# Patient Record
Sex: Female | Born: 1990
Health system: Southern US, Community
[De-identification: ages and names within clinical notes are randomized; demographics above are authoritative.]

## PROBLEM LIST (undated history)

## (undated) DIAGNOSIS — R0981 Nasal congestion: Secondary | ICD-10-CM

## (undated) DIAGNOSIS — R059 Cough, unspecified: Secondary | ICD-10-CM

## (undated) DIAGNOSIS — R109 Unspecified abdominal pain: Secondary | ICD-10-CM

## (undated) DIAGNOSIS — R519 Headache, unspecified: Secondary | ICD-10-CM

## (undated) DIAGNOSIS — E282 Polycystic ovarian syndrome: Secondary | ICD-10-CM

## (undated) DIAGNOSIS — D219 Benign neoplasm of connective and other soft tissue, unspecified: Secondary | ICD-10-CM

## (undated) DIAGNOSIS — R51 Headache: Secondary | ICD-10-CM

## (undated) DIAGNOSIS — R110 Nausea: Secondary | ICD-10-CM

## (undated) DIAGNOSIS — J029 Acute pharyngitis, unspecified: Secondary | ICD-10-CM

## (undated) DIAGNOSIS — R05 Cough: Secondary | ICD-10-CM

## (undated) HISTORY — DX: Headache: R51

## (undated) HISTORY — DX: Unspecified abdominal pain: R10.9

## (undated) HISTORY — DX: Cough, unspecified: R05.9

## (undated) HISTORY — DX: Cough: R05

## (undated) HISTORY — DX: Headache, unspecified: R51.9

## (undated) HISTORY — DX: Acute pharyngitis, unspecified: J02.9

## (undated) HISTORY — DX: Nasal congestion: R09.81

## (undated) HISTORY — DX: Nausea: R11.0

## (undated) HISTORY — PX: WISDOM TOOTH EXTRACTION: SHX21

---
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
## 2011-12-02 ENCOUNTER — Encounter (INDEPENDENT_AMBULATORY_CARE_PROVIDER_SITE_OTHER): Payer: Self-pay | Admitting: General Surgery

## 2011-12-02 ENCOUNTER — Other Ambulatory Visit (INDEPENDENT_AMBULATORY_CARE_PROVIDER_SITE_OTHER): Payer: Self-pay | Admitting: General Surgery

## 2011-12-02 ENCOUNTER — Ambulatory Visit (INDEPENDENT_AMBULATORY_CARE_PROVIDER_SITE_OTHER): Payer: BC Managed Care – PPO | Admitting: General Surgery

## 2011-12-02 VITALS — BP 122/76 | HR 72 | Temp 97.3°F | Resp 14 | Ht 67.0 in | Wt 242.1 lb

## 2011-12-02 DIAGNOSIS — R1013 Epigastric pain: Secondary | ICD-10-CM

## 2011-12-02 NOTE — Progress Notes (Signed)
Patient ID: Caitlyn Ramos, female   DOB: 1990/11/15, 21 y.o.   MRN: [id number]  Chief Complaint  Patient presents with  . Abdominal Pain    HPI Caitlyn Ramos is a 21 y.o. female.  She is referred by Dr. Aram Beecham, [facility] emergency department, for evaluation of "gallstones".  Over the past 2 weeks she has had 3 episodes of nocturnal epigastric pain radiating to the back. She says this is non-lateralizing but associated with nausea, no vomiting. This has never happened in the past otherwise. No diarrhea.  She went to the emergency room  yesterday. Lab work shows hemoglobin 12.9, WBC 8700, AST 50, ALT 62, but slightly elevated, lipase 28.  CT scan showed a 4.7 cm cyst in the right adnexal area. The gallbladder was within normal limits. An ultrasound was not done.  She was discharged with a diagnosis of gallstones and told to see a surgeon and made an appointment to see me. She is asymptomatic today.  Family history is notable for cholecystectomy in a grandmother and a great aunt.  Her medical history is significant for obesity but otherwise she is healthy and she is an Building control surveyor at [employer]. She is here with her mother today. She denies alcohol use, specifically denies it within 24 hours of the emergency department visit.Marland Kitchen HPI  Past Medical History  Diagnosis Date  . Nasal congestion   . Sore throat   . Cough   . Abdominal pain   . Nausea   . Generalized headaches     Past Surgical History  Procedure Date  . Wisdom tooth extraction 2009 approximate    Family History  Problem Relation Age of Onset  . Cancer Maternal Grandfather     lung    Social History History  Substance Use Topics  . Smoking status: Never Smoker   . Smokeless tobacco: Never Used  . Alcohol Use: No    No Known Allergies  Current Outpatient Prescriptions  Medication Sig Dispense Refill  . acetaminophen (TYLENOL) 500 MG tablet Take 500 mg by mouth as needed.      . Naproxen  Sodium (ALEVE PO) Take by mouth as needed.      . phentermine (ADIPEX-P) 37.5 MG tablet Daily.        Review of Systems Review of Systems  Constitutional: Negative for fever, chills and unexpected weight change.  HENT: Negative for hearing loss, congestion, sore throat, trouble swallowing and voice change.   Eyes: Negative for visual disturbance.  Respiratory: Negative for cough and wheezing.   Cardiovascular: Negative for chest pain, palpitations and leg swelling.  Gastrointestinal: Positive for nausea and abdominal pain. Negative for vomiting, diarrhea, constipation, blood in stool, abdominal distention and anal bleeding.  Genitourinary: Negative for hematuria, vaginal bleeding and difficulty urinating.  Musculoskeletal: Negative for arthralgias.  Skin: Negative for rash and wound.  Neurological: Negative for seizures, syncope and headaches.  Hematological: Negative for adenopathy. Does not bruise/bleed easily.  Psychiatric/Behavioral: Negative for confusion.    Blood pressure 122/76, pulse 72, temperature 97.3 F (36.3 C), temperature source Temporal, resp. rate 14, height 5\' 7"  (1.702 m), weight 242 lb 2 oz (109.827 kg).  Physical Exam Physical Exam  Constitutional: She is oriented to person, place, and time. She appears well-developed and well-nourished. No distress.       BMI 37.92  HENT:  Head: Normocephalic and atraumatic.  Nose: Nose normal.  Mouth/Throat: No oropharyngeal exudate.  Eyes: Conjunctivae normal and EOM are normal. Pupils are equal, round,  and reactive to light. Left eye exhibits no discharge. No scleral icterus.  Neck: Neck supple. No JVD present. No tracheal deviation present. No thyromegaly present.  Cardiovascular: Normal rate, regular rhythm, normal heart sounds and intact distal pulses.   No murmur heard. Pulmonary/Chest: Effort normal and breath sounds normal. No respiratory distress. She has no wheezes. She has no rales. She exhibits no tenderness.    Abdominal: Soft. Bowel sounds are normal. She exhibits no distension and no mass. There is no tenderness. There is no rebound and no guarding.       Subjectively tender in the epigastrium, exam otherwise unremarkable.  Musculoskeletal: She exhibits no edema and no tenderness.  Lymphadenopathy:    She has no cervical adenopathy.  Neurological: She is alert and oriented to person, place, and time. She exhibits normal muscle tone. Coordination normal.  Skin: Skin is warm. No rash noted. She is not diaphoretic. No erythema. No pallor.  Psychiatric: She has a normal mood and affect. Her behavior is normal. Judgment and thought content normal.    Data Reviewed Lab work, CT scan, and physician records from [facility] emergency department dated 12/01/2011.  Assessment    Recent onset intermittent attacks of epigastric pain. The history of the pain and the slight elevation of liver function tests suggest biliary tract disease. Alternatively as this could be due to the fatty infiltration of the liver.  Obesity    Plan    I discussed the differential diagnosis with the patient and her mother.  Low-fat diet  Gallbladder ultrasound  She was advised to see her gynecologist for further evaluation of the right adnexal cyst, although I told her I did not think this was the source of her pain.  Return to see me in 2 weeks.       Angelia Mould. Derrell Lolling, M.D., [facility] General and Minimally invasive Surgery Breast and Colorectal Surgery Office:   [phone] Pager:   [phone]  12/02/2011, 4:10 PM

## 2011-12-02 NOTE — Patient Instructions (Signed)
Your episodes of upper abdominal pain sound like gallbladder attacks. The CT scan showed a normal gallbladder, however.  You are  advised to follow a very low-fat diet for now.  You are advised to see your gynecologist to evaluate the right ovarian cyst.  You will be scheduled for a gallbladder ultrasound.  Return to see Dr. Derrell Lolling in approximately 2 weeks.

## 2011-12-04 ENCOUNTER — Ambulatory Visit (HOSPITAL_COMMUNITY)
Admission: RE | Admit: 2011-12-04 | Discharge: 2011-12-04 | Disposition: A | Discharge status: Home / Self Care | Payer: BC Managed Care – PPO | Source: Ambulatory Visit | Attending: General Surgery | Admitting: General Surgery

## 2011-12-04 ENCOUNTER — Other Ambulatory Visit (INDEPENDENT_AMBULATORY_CARE_PROVIDER_SITE_OTHER): Payer: Self-pay | Admitting: General Surgery

## 2011-12-04 DIAGNOSIS — R1013 Epigastric pain: Secondary | ICD-10-CM

## 2011-12-04 DIAGNOSIS — R109 Unspecified abdominal pain: Secondary | ICD-10-CM | POA: Insufficient documentation

## 2011-12-04 DIAGNOSIS — R945 Abnormal results of liver function studies: Secondary | ICD-10-CM | POA: Insufficient documentation

## 2011-12-07 ENCOUNTER — Telehealth (INDEPENDENT_AMBULATORY_CARE_PROVIDER_SITE_OTHER): Payer: Self-pay | Admitting: General Surgery

## 2011-12-07 NOTE — Telephone Encounter (Signed)
Called patient to advise of test results at the request of Dr. Derrell Lolling. Patient advised that if the problems persist she may need to be seen by gastroenterology, as there was nothing based on the upper gi and ultrasound that showed a problem with her gallbladder.  Advised patient to still keep an eye on what she eats and stay away from fatty, fried, oily foods and if the problems continue to see GI doctor.  Patient requested for appointment on 12/17/11 to be cancelled based on results and advised she will call back if the problems continue in order to be referred to GI if needed.

## 2011-12-17 ENCOUNTER — Encounter (INDEPENDENT_AMBULATORY_CARE_PROVIDER_SITE_OTHER): Payer: BC Managed Care – PPO | Admitting: General Surgery

## 2012-01-18 ENCOUNTER — Encounter (INDEPENDENT_AMBULATORY_CARE_PROVIDER_SITE_OTHER): Payer: Self-pay

## 2016-07-14 ENCOUNTER — Encounter: Payer: Self-pay | Admitting: Adult Health

## 2016-07-14 ENCOUNTER — Ambulatory Visit (INDEPENDENT_AMBULATORY_CARE_PROVIDER_SITE_OTHER): Payer: BC Managed Care – PPO | Admitting: Adult Health

## 2016-07-14 DIAGNOSIS — E669 Obesity, unspecified: Secondary | ICD-10-CM | POA: Insufficient documentation

## 2016-07-14 DIAGNOSIS — Z0000 Encounter for general adult medical examination without abnormal findings: Secondary | ICD-10-CM | POA: Diagnosis not present

## 2016-07-14 DIAGNOSIS — B3731 Acute candidiasis of vulva and vagina: Secondary | ICD-10-CM | POA: Insufficient documentation

## 2016-07-14 DIAGNOSIS — B373 Candidiasis of vulva and vagina: Secondary | ICD-10-CM | POA: Diagnosis not present

## 2016-07-14 MED ORDER — FLUCONAZOLE 150 MG PO TABS: 150.0000 mg | ORAL_TABLET | Freq: Once | ORAL | 2 refills | Status: AC

## 2016-07-14 NOTE — Progress Notes (Signed)
Subjective:    Patient ID: Caitlyn Ramos, female    DOB: July 10, 1990, 26 y.o.   MRN: [id number]  HPI :  Ms. Kuwahara presents to establish as a new pt.  She is a very pleasant 26 year old female.  PMH:  Obesity and chronic UTIs and vaginal yeast infections. Has lost 100 lbs in last 12 months via TLC: IMPRESSIVE! 1500 cal daily diet, eats meals Q3H. Trains with coach 4days/week-cardio and weight training. Current wt 242 Goal wt 170 She currently has sx's of vaginal yeast infection that began yesterday (thick/white/clumpy vaginal d/c).  She denies abdominal pain, N/V/D, blood in urine/stool. She is followed by GYN and is planning on making appt with Urologist due to chronic UTIs (she did not require referral today, already established).  Patient Care Team    Relationship Specialty Notifications Start End  Caryl Bis, MD PCP - General Unknown Physician Specialty  12/02/11     Patient Active Problem List   Diagnosis Date Noted  . Health care maintenance 07/14/2016  . Vaginal yeast infection 07/14/2016     Past Medical History:  Diagnosis Date  . Abdominal pain   . Cough   . Generalized headaches   . Nasal congestion   . Nausea   . Sore throat      Past Surgical History:  Procedure Laterality Date  . WISDOM TOOTH EXTRACTION  2009 approximate     Family History  Problem Relation Age of Onset  . Cancer Maternal Grandfather        lung     History  Drug Use No     History  Alcohol Use No     History  Smoking Status  . Never Smoker  Smokeless Tobacco  . Never Used     Outpatient Encounter Prescriptions as of 07/14/2016  Medication Sig  . acetaminophen (TYLENOL) 500 MG tablet Take 500 mg by mouth as needed.  Marland Kitchen aspirin-acetaminophen-caffeine (EXCEDRIN MIGRAINE) 250-250-65 MG tablet Take by mouth as needed for headache.  . fexofenadine (ALLEGRA) 30 MG tablet Take 30 mg by mouth as needed.  . fluconazole (DIFLUCAN) 150 MG tablet Take 1 tablet (150 mg total) by  mouth once.  . Naproxen Sodium (ALEVE PO) Take by mouth as needed.  . [DISCONTINUED] phentermine (ADIPEX-P) 37.5 MG tablet Daily.   No facility-administered encounter medications on file as of 07/14/2016.     Allergies: Patient has no known allergies.  Body mass index is 37.9 kg/m.  Blood pressure 107/70, pulse 68, height 5\' 7"  (1.702 m), weight 242 lb (109.8 kg), last menstrual period 07/06/2016.    Review of Systems  Constitutional: Positive for fatigue. Negative for activity change, appetite change, chills, diaphoresis, fever and unexpected weight change.  Eyes: Negative for visual disturbance.  Respiratory: Negative for cough, chest tightness, shortness of breath, wheezing and stridor.   Cardiovascular: Negative for chest pain, palpitations and leg swelling.  Gastrointestinal: Negative for abdominal distention, abdominal pain, blood in stool, constipation, diarrhea, nausea and vomiting.  Endocrine: Negative for cold intolerance, heat intolerance, polydipsia, polyphagia and polyuria.  Genitourinary: Positive for vaginal discharge. Negative for difficulty urinating, flank pain, hematuria, urgency, vaginal bleeding and vaginal pain.  Musculoskeletal: Negative for arthralgias, back pain, gait problem, joint swelling, myalgias, neck pain and neck stiffness.  Skin: Negative for color change, pallor, rash and wound.  Neurological: Negative for dizziness, weakness and light-headedness.  Hematological: Does not bruise/bleed easily.  Psychiatric/Behavioral: Negative for sleep disturbance.       Objective:  Physical Exam  Constitutional: She is oriented to person, place, and time. She appears well-developed and well-nourished. No distress.  HENT:  Head: Normocephalic and atraumatic.  Right Ear: External ear normal.  Left Ear: External ear normal.  Eyes: Conjunctivae are normal. Pupils are equal, round, and reactive to light.  Neck: Normal range of motion. Neck supple.   Cardiovascular: Normal rate, regular rhythm and intact distal pulses.   No murmur heard. Pulmonary/Chest: Effort normal and breath sounds normal. No respiratory distress. She has no wheezes. She has no rales. She exhibits no tenderness.  Abdominal: Soft. Bowel sounds are normal. She exhibits no distension and no mass. There is no tenderness. There is no rebound and no guarding.  Musculoskeletal: Normal range of motion.  Lymphadenopathy:    She has no cervical adenopathy.  Neurological: She is alert and oriented to person, place, and time. Coordination normal.  Skin: Skin is warm and dry. No rash noted. She is not diaphoretic. No erythema. No pallor.  Psychiatric: She has a normal mood and affect. Her behavior is normal. Judgment and thought content normal.  Nursing note and vitals reviewed.         Assessment & Plan:   1. Health care maintenance   2. Vaginal yeast infection   3. Obesity (BMI 35.0-39.9 without comorbidity)     Health care maintenance Please schedule fasting lab nurse visit at your convenience. Annual follow-up, sooner if needed.  Vaginal yeast infection Diflucan Rx sent in with 2 refills. Information hand out provided.   Obesity (BMI 35.0-39.9 without comorbidity) Has lost 100 lbs in last 12 months via TLC: IMPRESSIVE! 1500 cal daily diet, eats meals Q3H. Trains with coach 4days/week-cardio and weight training. Current wt 242 Goal wt 170    FOLLOW-UP:  Return in about 1 year (around 07/14/2017) for Regular Follow Up.

## 2016-07-14 NOTE — Assessment & Plan Note (Signed)
Has lost 100 lbs in last 12 months via TLC: IMPRESSIVE! 1500 cal daily diet, eats meals Q3H. Trains with coach 4days/week-cardio and weight training. Current wt 242 Goal wt 170

## 2016-07-14 NOTE — Assessment & Plan Note (Signed)
Diflucan Rx sent in with 2 refills. Information hand out provided.

## 2016-07-14 NOTE — Patient Instructions (Addendum)
Vaginal Yeast infection, Adult Vaginal yeast infection is a condition that causes soreness, swelling, and redness (inflammation) of the vagina. It also causes vaginal discharge. This is a common condition. Some women get this infection frequently. What are the causes? This condition is caused by a change in the normal balance of the yeast (candida) and bacteria that live in the vagina. This change causes an overgrowth of yeast, which causes the inflammation. What increases the risk? This condition is more likely to develop in:  Women who take antibiotic medicines.  Women who have diabetes.  Women who take birth control pills.  Women who are pregnant.  Women who douche often.  Women who have a weak defense (immune) system.  Women who have been taking steroid medicines for a long time.  Women who frequently wear tight clothing.  What are the signs or symptoms? Symptoms of this condition include:  White, thick vaginal discharge.  Swelling, itching, redness, and irritation of the vagina. The lips of the vagina (vulva) may be affected as well.  Pain or a burning feeling while urinating.  Pain during sex.  How is this diagnosed? This condition is diagnosed with a medical history and physical exam. This will include a pelvic exam. Your health care provider will examine a sample of your vaginal discharge under a microscope. Your health care provider may send this sample for testing to confirm the diagnosis. How is this treated? This condition is treated with medicine. Medicines may be over-the-counter or prescription. You may be told to use one or more of the following:  Medicine that is taken orally.  Medicine that is applied as a cream.  Medicine that is inserted directly into the vagina (suppository).  Follow these instructions at home:  Take or apply over-the-counter and prescription medicines only as told by your health care provider.  Do not have sex until your health  care provider has approved. Tell your sex partner that you have a yeast infection. That person should go to his or her health care provider if he or she develops symptoms.  Do not wear tight clothes, such as pantyhose or tight pants.  Avoid using tampons until your health care provider approves.  Eat more yogurt. This may help to keep your yeast infection from returning.  Try taking a sitz bath to help with discomfort. This is a warm water bath that is taken while you are sitting down. The water should only come up to your hips and should cover your buttocks. Do this 3-4 times per day or as told by your health care provider.  Do not douche.  Wear breathable, cotton underwear.  If you have diabetes, keep your blood sugar levels under control. Contact a health care provider if:  You have a fever.  Your symptoms go away and then return.  Your symptoms do not get better with treatment.  Your symptoms get worse.  You have new symptoms.  You develop blisters in or around your vagina.  You have blood coming from your vagina and it is not your menstrual period.  You develop pain in your abdomen. This information is not intended to replace advice given to you by your health care provider. Make sure you discuss any questions you have with your health care provider. Document Released: 10/22/2004 Document Revised: 06/26/2015 Document Reviewed: 07/16/2014 Elsevier Interactive Patient Education  2018 [place] Rx sent in with 2 refills. Please schedule fasting lab nurse visit at your convenience. Annual follow-up, sooner if  needed. NICE TO MEET YOU!

## 2016-07-14 NOTE — Assessment & Plan Note (Signed)
Please schedule fasting lab nurse visit at your convenience. Annual follow-up, sooner if needed.

## 2016-08-04 ENCOUNTER — Other Ambulatory Visit (INDEPENDENT_AMBULATORY_CARE_PROVIDER_SITE_OTHER): Payer: BC Managed Care – PPO

## 2016-08-04 DIAGNOSIS — Z0000 Encounter for general adult medical examination without abnormal findings: Secondary | ICD-10-CM | POA: Diagnosis not present

## 2016-08-04 DIAGNOSIS — E669 Obesity, unspecified: Secondary | ICD-10-CM

## 2016-08-05 LAB — CBC WITH DIFFERENTIAL/PLATELET
BASOS: 0 %
Basophils Absolute: 0 10*3/uL (ref 0.0–0.2)
EOS (ABSOLUTE): 0.1 10*3/uL (ref 0.0–0.4)
EOS: 1 %
HEMATOCRIT: 38.1 % (ref 34.0–46.6)
HEMOGLOBIN: 12.6 g/dL (ref 11.1–15.9)
Immature Grans (Abs): 0 10*3/uL (ref 0.0–0.1)
Immature Granulocytes: 0 %
LYMPHS ABS: 1.5 10*3/uL (ref 0.7–3.1)
Lymphs: 24 %
MCH: 30.7 pg (ref 26.6–33.0)
MCHC: 33.1 g/dL (ref 31.5–35.7)
MCV: 93 fL (ref 79–97)
MONOCYTES: 6 %
Monocytes Absolute: 0.4 10*3/uL (ref 0.1–0.9)
Neutrophils Absolute: 4.5 10*3/uL (ref 1.4–7.0)
Neutrophils: 69 %
Platelets: 319 10*3/uL (ref 150–379)
RBC: 4.1 x10E6/uL (ref 3.77–5.28)
RDW: 13.5 % (ref 12.3–15.4)
WBC: 6.5 10*3/uL (ref 3.4–10.8)

## 2016-08-05 LAB — LIPID PANEL
CHOL/HDL RATIO: 3.1 ratio (ref 0.0–4.4)
Cholesterol, Total: 161 mg/dL (ref 100–199)
HDL: 52 mg/dL (ref >39)
LDL CALC: 101 mg/dL — AB (ref 0–99)
Triglycerides: 40 mg/dL (ref 0–149)
VLDL Cholesterol Cal: 8 mg/dL (ref 5–40)

## 2016-08-05 LAB — COMPREHENSIVE METABOLIC PANEL
ALBUMIN: 4.4 g/dL (ref 3.5–5.5)
ALK PHOS: 53 IU/L (ref 39–117)
ALT: 26 IU/L (ref 0–32)
AST: 21 IU/L (ref 0–40)
Albumin/Globulin Ratio: 1.8 (ref 1.2–2.2)
BUN / CREAT RATIO: 17 (ref 9–23)
BUN: 16 mg/dL (ref 6–20)
Bilirubin Total: 0.8 mg/dL (ref 0.0–1.2)
CALCIUM: 9.4 mg/dL (ref 8.7–10.2)
CO2: 24 mmol/L (ref 20–29)
CREATININE: 0.94 mg/dL (ref 0.57–1.00)
Chloride: 103 mmol/L (ref 96–106)
GFR calc Af Amer: 98 mL/min/{1.73_m2} (ref >59)
GFR, EST NON AFRICAN AMERICAN: 85 mL/min/{1.73_m2} (ref >59)
GLOBULIN, TOTAL: 2.5 g/dL (ref 1.5–4.5)
Glucose: 85 mg/dL (ref 65–99)
Potassium: 4.3 mmol/L (ref 3.5–5.2)
SODIUM: 142 mmol/L (ref 134–144)
Total Protein: 6.9 g/dL (ref 6.0–8.5)

## 2016-08-05 LAB — TSH: TSH: 2.29 u[IU]/mL (ref 0.450–4.500)

## 2016-08-05 LAB — VITAMIN D 25 HYDROXY (VIT D DEFICIENCY, FRACTURES): Vit D, 25-Hydroxy: 28.1 ng/mL — ABNORMAL LOW (ref 30.0–100.0)

## 2016-08-05 LAB — HEMOGLOBIN A1C
ESTIMATED AVERAGE GLUCOSE: 97 mg/dL
HEMOGLOBIN A1C: 5 % (ref 4.8–5.6)

## 2017-08-25 ENCOUNTER — Encounter: Payer: Self-pay | Admitting: Adult Health

## 2017-08-25 ENCOUNTER — Ambulatory Visit (INDEPENDENT_AMBULATORY_CARE_PROVIDER_SITE_OTHER): Payer: BC Managed Care – PPO | Admitting: Adult Health

## 2017-08-25 VITALS — BP 107/71 | HR 53 | Ht 67.0 in | Wt 220.9 lb

## 2017-08-25 DIAGNOSIS — Z0000 Encounter for general adult medical examination without abnormal findings: Secondary | ICD-10-CM | POA: Diagnosis not present

## 2017-08-25 DIAGNOSIS — B373 Candidiasis of vulva and vagina: Secondary | ICD-10-CM

## 2017-08-25 DIAGNOSIS — B3731 Acute candidiasis of vulva and vagina: Secondary | ICD-10-CM

## 2017-08-25 DIAGNOSIS — E282 Polycystic ovarian syndrome: Secondary | ICD-10-CM | POA: Diagnosis not present

## 2017-08-25 DIAGNOSIS — N912 Amenorrhea, unspecified: Secondary | ICD-10-CM | POA: Insufficient documentation

## 2017-08-25 LAB — POCT GLYCOSYLATED HEMOGLOBIN (HGB A1C): Hemoglobin A1C: 4.8 % (ref 4.0–5.6)

## 2017-08-25 LAB — POCT URINE PREGNANCY: PREG TEST UR: NEGATIVE

## 2017-08-25 MED ORDER — FLUCONAZOLE 150 MG PO TABS: 150.0000 mg | ORAL_TABLET | Freq: Once | ORAL | 0 refills | Status: DC

## 2017-08-25 NOTE — Assessment & Plan Note (Signed)
Urine pregnancy -  Negative

## 2017-08-25 NOTE — Assessment & Plan Note (Signed)
Please take Diflucan to treat yeast infection, may repeat in one week if needed. Urine pregnancy- negative

## 2017-08-25 NOTE — Progress Notes (Signed)
Subjective:    Patient ID: Caitlyn Ramos, female    DOB: 14-Sep-1990, 27 y.o.   MRN: [id number]  HPI:  Caitlyn Ramos presents with thick/white vaginal d/c, vaginal itching, reddened labia, and mild dysuria that all started a week ago. She had her IUD removed 3 months ago and she is currently trying to conceive. She has hx of recurrent yeast infections and states "this feels exactly like a yeast infection". She drinks > gallon water/day, follows strict heart healthy diet, vigorous exercise 6 days/week She has lost >110 lbs in 2 years, current wt 220 She denies ETOH/tobacco use  Patient Care Team    Relationship Specialty Notifications Start End  Esaw Grandchild, NP PCP - General Family Medicine  07/14/16     Patient Active Problem List   Diagnosis Date Noted  . Amenorrhea 08/25/2017  . Health care maintenance 07/14/2016  . Vaginal yeast infection 07/14/2016  . Obesity (BMI 35.0-39.9 without comorbidity) 07/14/2016     Past Medical History:  Diagnosis Date  . Abdominal pain   . Cough   . Generalized headaches   . Nasal congestion   . Nausea   . Sore throat      Past Surgical History:  Procedure Laterality Date  . WISDOM TOOTH EXTRACTION  2009 approximate     Family History  Problem Relation Age of Onset  . Cancer Maternal Grandfather        lung     Social History   Substance and Sexual Activity  Drug Use No     Social History   Substance and Sexual Activity  Alcohol Use No     Social History   Tobacco Use  Smoking Status Never Smoker  Smokeless Tobacco Never Used     Outpatient Encounter Medications as of 08/25/2017  Medication Sig  . acetaminophen (TYLENOL) 500 MG tablet Take 500 mg by mouth as needed.  Marland Kitchen aspirin-acetaminophen-caffeine (EXCEDRIN MIGRAINE) 250-250-65 MG tablet Take by mouth as needed for headache.  . fexofenadine (ALLEGRA) 30 MG tablet Take 30 mg by mouth as needed.  . Naproxen Sodium (ALEVE PO) Take by mouth as needed.  .  fluconazole (DIFLUCAN) 150 MG tablet Take 1 tablet (150 mg total) by mouth once for 1 dose.   No facility-administered encounter medications on file as of 08/25/2017.     Allergies: Patient has no known allergies.  Body mass index is 34.6 kg/m.  Blood pressure 107/71, pulse (!) 53, height 5\' 7"  (1.702 m), weight 220 lb 14.4 oz (100.2 kg), last menstrual period 07/25/2017, SpO2 100 %.  Review of Systems  Constitutional: Negative for activity change, appetite change, chills, diaphoresis, fatigue, fever and unexpected weight change.  Eyes: Negative for visual disturbance.  Respiratory: Negative for cough, chest tightness, shortness of breath, wheezing and stridor.   Cardiovascular: Negative for chest pain, palpitations and leg swelling.  Gastrointestinal: Negative for abdominal distention, abdominal pain, blood in stool, constipation, diarrhea, nausea and vomiting.  Endocrine: Negative for cold intolerance, heat intolerance, polydipsia, polyphagia and polyuria.  Genitourinary: Positive for vaginal discharge. Negative for difficulty urinating, dysuria, flank pain, pelvic pain, urgency, vaginal bleeding and vaginal pain.  Neurological: Negative for dizziness and headaches.  Hematological: Does not bruise/bleed easily.       Objective:   Physical Exam  Constitutional: She is oriented to person, place, and time. She appears well-developed and well-nourished. No distress.  HENT:  Head: Normocephalic and atraumatic.  Right Ear: External ear normal.  Left Ear: External ear normal.  Nose: Nose normal.  Mouth/Throat: Oropharynx is clear and moist.  Eyes: Pupils are equal, round, and reactive to light. Conjunctivae and EOM are normal.  Neurological: She is alert and oriented to person, place, and time.  Skin: Skin is warm and dry. Capillary refill takes less than 2 seconds. No rash noted. She is not diaphoretic. No erythema. No pallor.  Psychiatric: She has a normal mood and affect. Her  behavior is normal. Judgment and thought content normal.  Nursing note and vitals reviewed.     Assessment & Plan:   1. Amenorrhea   2. Vaginal yeast infection   3. Health care maintenance     Vaginal yeast infection Please take Diflucan to treat yeast infection, may repeat in one week if needed. Urine pregnancy- negative   Amenorrhea Urine pregnancy -  Negative  Health care maintenance Continue your SUPERB exercise and healthy eating! Recommend complete physical with fasting labs this fall.    FOLLOW-UP:  Return if symptoms worsen or fail to improve.

## 2017-08-25 NOTE — Addendum Note (Signed)
Addended by: Fonnie Mu on: 08/25/2017 05:02 PM   Modules accepted: Orders

## 2017-08-25 NOTE — Assessment & Plan Note (Signed)
Continue your SUPERB exercise and healthy eating! Recommend complete physical with fasting labs this fall.

## 2017-08-25 NOTE — Patient Instructions (Signed)
Vaginal Yeast infection, Adult Vaginal yeast infection is a condition that causes soreness, swelling, and redness (inflammation) of the vagina. It also causes vaginal discharge. This is a common condition. Some women get this infection frequently. What are the causes? This condition is caused by a change in the normal balance of the yeast (candida) and bacteria that live in the vagina. This change causes an overgrowth of yeast, which causes the inflammation. What increases the risk? This condition is more likely to develop in:  Women who take antibiotic medicines.  Women who have diabetes.  Women who take birth control pills.  Women who are pregnant.  Women who douche often.  Women who have a weak defense (immune) system.  Women who have been taking steroid medicines for a long time.  Women who frequently wear tight clothing.  What are the signs or symptoms? Symptoms of this condition include:  White, thick vaginal discharge.  Swelling, itching, redness, and irritation of the vagina. The lips of the vagina (vulva) may be affected as well.  Pain or a burning feeling while urinating.  Pain during sex.  How is this diagnosed? This condition is diagnosed with a medical history and physical exam. This will include a pelvic exam. Your health care provider will examine a sample of your vaginal discharge under a microscope. Your health care provider may send this sample for testing to confirm the diagnosis. How is this treated? This condition is treated with medicine. Medicines may be over-the-counter or prescription. You may be told to use one or more of the following:  Medicine that is taken orally.  Medicine that is applied as a cream.  Medicine that is inserted directly into the vagina (suppository).  Follow these instructions at home:  Take or apply over-the-counter and prescription medicines only as told by your health care provider.  Do not have sex until your health  care provider has approved. Tell your sex partner that you have a yeast infection. That person should go to his or her health care provider if he or she develops symptoms.  Do not wear tight clothes, such as pantyhose or tight pants.  Avoid using tampons until your health care provider approves.  Eat more yogurt. This may help to keep your yeast infection from returning.  Try taking a sitz bath to help with discomfort. This is a warm water bath that is taken while you are sitting down. The water should only come up to your hips and should cover your buttocks. Do this 3-4 times per day or as told by your health care provider.  Do not douche.  Wear breathable, cotton underwear.  If you have diabetes, keep your blood sugar levels under control. Contact a health care provider if:  You have a fever.  Your symptoms go away and then return.  Your symptoms do not get better with treatment.  Your symptoms get worse.  You have new symptoms.  You develop blisters in or around your vagina.  You have blood coming from your vagina and it is not your menstrual period.  You develop pain in your abdomen. This information is not intended to replace advice given to you by your health care provider. Make sure you discuss any questions you have with your health care provider. Document Released: 10/22/2004 Document Revised: 06/26/2015 Document Reviewed: 07/16/2014 Elsevier Interactive Patient Education  2018 [employer] your SUPERB exercise and healthy eating! Please take Diflucan to treat yeast infection, may repeat in one week if  needed. Urine pregnancy- negative Recommend complete physical with fasting labs this fall. NICE TO SEE YOU!

## 2017-09-24 ENCOUNTER — Other Ambulatory Visit: Payer: Self-pay

## 2017-09-24 ENCOUNTER — Inpatient Hospital Stay (HOSPITAL_COMMUNITY): Payer: BC Managed Care – PPO

## 2017-09-24 ENCOUNTER — Inpatient Hospital Stay (HOSPITAL_COMMUNITY)
Admission: AD | Admit: 2017-09-24 | Discharge: 2017-09-24 | Disposition: A | Discharge status: Home / Self Care | Payer: BC Managed Care – PPO | Source: Ambulatory Visit | Attending: Obstetrics and Gynecology | Admitting: Obstetrics and Gynecology

## 2017-09-24 ENCOUNTER — Encounter (HOSPITAL_COMMUNITY): Payer: Self-pay | Admitting: *Deleted

## 2017-09-24 DIAGNOSIS — O208 Other hemorrhage in early pregnancy: Secondary | ICD-10-CM

## 2017-09-24 DIAGNOSIS — Z3A01 Less than 8 weeks gestation of pregnancy: Secondary | ICD-10-CM

## 2017-09-24 DIAGNOSIS — O039 Complete or unspecified spontaneous abortion without complication: Secondary | ICD-10-CM | POA: Diagnosis not present

## 2017-09-24 DIAGNOSIS — O209 Hemorrhage in early pregnancy, unspecified: Secondary | ICD-10-CM

## 2017-09-24 DIAGNOSIS — N939 Abnormal uterine and vaginal bleeding, unspecified: Secondary | ICD-10-CM | POA: Diagnosis present

## 2017-09-24 LAB — URINALYSIS, ROUTINE W REFLEX MICROSCOPIC
BACTERIA UA: NONE SEEN
Bilirubin Urine: NEGATIVE
Glucose, UA: NEGATIVE mg/dL
Ketones, ur: NEGATIVE mg/dL
Leukocytes, UA: NEGATIVE
NITRITE: NEGATIVE
Protein, ur: NEGATIVE mg/dL
SPECIFIC GRAVITY, URINE: 1.005 (ref 1.005–1.030)
pH: 6 (ref 5.0–8.0)

## 2017-09-24 LAB — WET PREP, GENITAL
CLUE CELLS WET PREP: NONE SEEN
SPERM: NONE SEEN
TRICH WET PREP: NONE SEEN
YEAST WET PREP: NONE SEEN

## 2017-09-24 LAB — CBC
HEMATOCRIT: 36.5 % (ref 36.0–46.0)
HEMOGLOBIN: 12.1 g/dL (ref 12.0–15.0)
MCH: 31.8 pg (ref 26.0–34.0)
MCHC: 33.2 g/dL (ref 30.0–36.0)
MCV: 95.8 fL (ref 78.0–100.0)
Platelets: 237 10*3/uL (ref 150–400)
RBC: 3.81 MIL/uL — AB (ref 3.87–5.11)
RDW: 13.9 % (ref 11.5–15.5)
WBC: 8.6 10*3/uL (ref 4.0–10.5)

## 2017-09-24 LAB — POCT PREGNANCY, URINE: Preg Test, Ur: POSITIVE — AB

## 2017-09-24 LAB — HCG, QUANTITATIVE, PREGNANCY: hCG, Beta Chain, Quant, S: 2654 m[IU]/mL — ABNORMAL HIGH (ref <5)

## 2017-09-24 LAB — ABO/RH: ABO/RH(D): A POS

## 2017-09-24 MED ORDER — ACETAMINOPHEN 500 MG PO TABS
1000.0000 mg | ORAL_TABLET | Freq: Once | ORAL | Status: AC
  Administered 2017-09-24: 1000 mg via ORAL
  Filled 2017-09-24: qty 2

## 2017-09-24 MED ORDER — HYDROCODONE-ACETAMINOPHEN 5-325 MG PO TABS
2.0000 | ORAL_TABLET | Freq: Once | ORAL | Status: DC
  Filled 2017-09-24: qty 2

## 2017-09-24 NOTE — MAU Note (Signed)
I am [redacted] wks pregnant and having bleeding and cramping. Cramping for couple days. Spotting this am and bleeding more this afternoon. Used one pad and has increased more since 1715

## 2017-09-24 NOTE — Discharge Instructions (Signed)

## 2017-09-24 NOTE — MAU Provider Note (Signed)
History     CSN: [id number]  Arrival date and time: 09/24/17 1850   First Provider Initiated Contact with Patient 09/24/17 2009      Chief Complaint  Patient presents with  . Vaginal Bleeding  . Abdominal Pain   Caitlyn Ramos is a 27 y.o. G1P0 at [redacted]w[redacted]d who presents today with vaginal bleeding that started today and has gotten heavier throughout the day. She states that she was seen in the office on 09/21/17 and had an US done. EDC based on that [place] was 05/20/18 EDC based, and she states that everything was normal at that [place].   Vaginal Bleeding  The patient's primary symptoms include pelvic pain and vaginal bleeding. This is a new problem. The current episode started today. The problem occurs constantly. The problem has been gradually worsening. Pain severity now: 8/10. The problem affects both sides. She is pregnant. Pertinent negatives include no chills, fever, nausea or vomiting. The vaginal discharge was bloody. The vaginal bleeding is typical of menses. She has been passing clots (golf ball sized ). The symptoms are aggravated by activity. She has tried nothing for the symptoms. She is sexually active. She uses nothing for contraception. Her menstrual history has been regular (LMP 07/23/17 ).    OB History    Gravida  1   Para      Term      Preterm      AB      Living        SAB      TAB      Ectopic      Multiple      Live Births              Past Medical History:  Diagnosis Date  . Abdominal pain   . Cough   . Generalized headaches   . Nasal congestion   . Nausea   . Sore throat     Past Surgical History:  Procedure Laterality Date  . WISDOM TOOTH EXTRACTION  2009 approximate    Family History  Problem Relation Age of Onset  . Cancer Maternal Grandfather        lung    Social History   Tobacco Use  . Smoking status: Never Smoker  . Smokeless tobacco: Never Used  Substance Use Topics  . Alcohol use: No  . Drug use: No    Allergies: No  Known Allergies  Medications Prior to Admission  Medication Sig Dispense Refill Last Dose  . acetaminophen (TYLENOL) 500 MG tablet Take 500 mg by mouth as needed.   Past Month at Unknown time  . Prenatal Vit-Fe Fumarate-FA (MULTIVITAMIN-PRENATAL) 27-0.8 MG TABS tablet Take 1 tablet by mouth daily at 12 noon.   09/24/2017 at Unknown time  . aspirin-acetaminophen-caffeine (EXCEDRIN MIGRAINE) 250-250-65 MG tablet Take by mouth as needed for headache.   Taking  . fexofenadine (ALLEGRA) 30 MG tablet Take 30 mg by mouth as needed.   Taking  . Naproxen Sodium (ALEVE PO) Take by mouth as needed.   Taking    Review of Systems  Constitutional: Negative for chills and fever.  Gastrointestinal: Negative for nausea and vomiting.  Genitourinary: Positive for pelvic pain and vaginal bleeding.   Physical Exam   Blood pressure 129/61, pulse 68, temperature 97.8 F (36.6 C), resp. rate 18, height 5\' 7"  (1.702 m), weight 106.1 kg, last menstrual period 07/23/2017.  Physical Exam  Nursing note and vitals reviewed. Constitutional: She is oriented to person, place, and  time. She appears well-developed and well-nourished. No distress.  HENT:  Head: Normocephalic.  Cardiovascular: Normal rate.  Respiratory: Effort normal.  GI: Soft. There is no tenderness. There is no rebound.  Genitourinary:  Genitourinary Comments:  External: no lesion Vagina: small amount of blood seen in the vagina. What appears to be a small gestational sac was seen in the vaginal vault. This was collected, and sent to pathology.  Cervix: pink, smooth, no CMT Uterus: NSSC Adnexa: NT   Neurological: She is alert and oriented to person, place, and time.  Skin: Skin is warm and dry.  Psychiatric: She has a normal mood and affect.   Results for orders placed or performed during the hospital encounter of 09/24/17 (from the past 24 hour(s))  Urinalysis, Routine w reflex microscopic     Status: Abnormal   Collection Time: 09/24/17   7:41 PM  Result Value Ref Range   Color, Urine STRAW (A) YELLOW   APPearance CLEAR CLEAR   Specific Gravity, Urine 1.005 1.005 - 1.030   pH 6.0 5.0 - 8.0   Glucose, UA NEGATIVE NEGATIVE mg/dL   Hgb urine dipstick MODERATE (A) NEGATIVE   Bilirubin Urine NEGATIVE NEGATIVE   Ketones, ur NEGATIVE NEGATIVE mg/dL   Protein, ur NEGATIVE NEGATIVE mg/dL   Nitrite NEGATIVE NEGATIVE   Leukocytes, UA NEGATIVE NEGATIVE   WBC, UA 0-5 0 - 5 WBC/hpf   Bacteria, UA NONE SEEN NONE SEEN   Squamous Epithelial / LPF 0-5 0 - 5  Pregnancy, urine POC     Status: Abnormal   Collection Time: 09/24/17  7:45 PM  Result Value Ref Range   Preg Test, Ur POSITIVE (A) NEGATIVE  Wet prep, genital     Status: Abnormal   Collection Time: 09/24/17  8:20 PM  Result Value Ref Range   Yeast Wet Prep HPF POC NONE SEEN NONE SEEN   Trich, Wet Prep NONE SEEN NONE SEEN   Clue Cells Wet Prep HPF POC NONE SEEN NONE SEEN   WBC, Wet Prep HPF POC FEW (A) NONE SEEN   Sperm NONE SEEN   CBC     Status: Abnormal   Collection Time: 09/24/17  8:27 PM  Result Value Ref Range   WBC 8.6 4.0 - 10.5 K/uL   RBC 3.81 (L) 3.87 - 5.11 MIL/uL   Hemoglobin 12.1 12.0 - 15.0 g/dL   HCT 36.5 36.0 - 46.0 %   MCV 95.8 78.0 - 100.0 fL   MCH 31.8 26.0 - 34.0 pg   MCHC 33.2 30.0 - 36.0 g/dL   RDW 13.9 11.5 - 15.5 %   Platelets 237 150 - 400 K/uL  hCG, quantitative, pregnancy     Status: Abnormal   Collection Time: 09/24/17  8:27 PM  Result Value Ref Range   hCG, Beta Chain, Quant, S 2,654 (H) <5 mIU/mL  ABO/Rh     Status: None   Collection Time: 09/24/17  8:27 PM  Result Value Ref Range   ABO/RH(D)      A POS Performed at [facility], [address], [place], [place] [address]    US Ob Less Than 14 Weeks With Ob Transvaginal  Result Date: 09/24/2017 CLINICAL DATA:  27 year old female with vaginal bleeding and cramping. LMP: 07/23/2017 corresponding to an estimated gestational age of [redacted] weeks, 0 days. EXAM: OBSTETRIC <14 WK [place] AND  TRANSVAGINAL OB US TECHNIQUE: Both transabdominal and transvaginal ultrasound examinations were performed for complete evaluation of the gestation as well as the maternal uterus, adnexal  regions, and pelvic cul-de-sac. Transvaginal technique was performed to assess early pregnancy. COMPARISON:  None. FINDINGS: The uterus is anteverted and slightly heterogeneous. There is a 7.6 x 8.1 x 7.6 cm left posterior exophytic fibroid. The endometrium is thickened measuring approximately 11 mm. No abnormal endometrial vascularity. No intrauterine pregnancy identified on today's exam. There is a reported prior IUP on 09/21/2017 at [redacted] weeks gestation. However, no prior imaging studies are available for this pregnancy. Therefore, there is remains a pregnancy of unknown location and the differential diagnosis includes early intrauterine pregnancy, recent spontaneous miscarriage, or an ectopic pregnancy. However, with the provided history of prior IUP findings most consistent with recent spontaneous miscarriage. Clinical correlation and follow-up with serial HCG recommended. Thickened echogenic endometrium likely represents blood product. Subchorionic hemorrhage:  None visualized. Maternal uterus/adnexae: The maternal ovaries appear unremarkable. The right ovary measures 3.3 x 2.3 x 2.6 cm and the left ovary measures 3.1 x 2.6 x 2.4 cm. Small free fluid within the pelvis. IMPRESSION: 1. No intrauterine pregnancy identified. This is consistent with pregnancy of unknown location. There is however provided history of IUP on the recent study and therefore findings most likely represent recent spontaneous abortion. Clinical correlation and follow-up with serial HCG, and repeat ultrasound if clinically indicated, recommended. 2. Thickened endometrial, likely containing blood product. 3. Large left uterine fibroid. 4. Unremarkable ovaries. Electronically Signed   By: Anner Crete M.D.   On: 09/24/2017 21:27     MAU Course   Procedures  MDM 10:10 PM Consult with Dr. Julien Girt reviewed patient hx including [place] with IUP in the office. Today US shows now IUP. Julien Girt agrees that this is SAB. Patient ok for DC home, and to FU in the office next week.    Assessment and Plan   1. Spontaneous abortion   2. Vaginal bleeding affecting early pregnancy   3. [redacted] weeks gestation of pregnancy    DC home Comfort measures reviewed  Emotional support and comfort pack given Pathology pending on POCs collected on today's exam Bleeding precautions RX: no new rx Return to MAU as needed FU with OB as planned  Follow-up Information    Dian Queen, MD Follow up.   Specialty:  Obstetrics and Gynecology Why:  Make an appointmen to be seen in the office next week  Contact information: [address] [place] [id number] [place] 09/24/2017, 8:10 PM

## 2017-09-24 NOTE — MAU Note (Signed)
States she is having clots and cramps that have worsened since being here. Reports cramping started 2-3 days that is 8/10 and similar to menstrual cramping . Felt sharp pain on Tuesday when running and has not felt it since. Reports having sex this morning.

## 2017-09-28 LAB — GC/CHLAMYDIA PROBE AMP (~~LOC~~) NOT AT ARMC
CHLAMYDIA, DNA PROBE: NEGATIVE
NEISSERIA GONORRHEA: NEGATIVE

## 2017-09-29 DIAGNOSIS — N911 Secondary amenorrhea: Secondary | ICD-10-CM | POA: Diagnosis not present

## 2017-12-01 DIAGNOSIS — E288 Other ovarian dysfunction: Secondary | ICD-10-CM | POA: Diagnosis not present

## 2017-12-01 DIAGNOSIS — D259 Leiomyoma of uterus, unspecified: Secondary | ICD-10-CM | POA: Diagnosis not present

## 2017-12-02 DIAGNOSIS — D259 Leiomyoma of uterus, unspecified: Secondary | ICD-10-CM | POA: Diagnosis not present

## 2017-12-02 DIAGNOSIS — E288 Other ovarian dysfunction: Secondary | ICD-10-CM | POA: Diagnosis not present

## 2017-12-14 DIAGNOSIS — R5383 Other fatigue: Secondary | ICD-10-CM | POA: Diagnosis not present

## 2017-12-14 DIAGNOSIS — Z23 Encounter for immunization: Secondary | ICD-10-CM | POA: Diagnosis not present

## 2017-12-14 DIAGNOSIS — E559 Vitamin D deficiency, unspecified: Secondary | ICD-10-CM | POA: Diagnosis not present

## 2017-12-14 DIAGNOSIS — Z0000 Encounter for general adult medical examination without abnormal findings: Secondary | ICD-10-CM | POA: Diagnosis not present

## 2017-12-17 DIAGNOSIS — Z3141 Encounter for fertility testing: Secondary | ICD-10-CM | POA: Diagnosis not present

## 2017-12-17 DIAGNOSIS — Q51810 Arcuate uterus: Secondary | ICD-10-CM | POA: Diagnosis not present

## 2017-12-17 DIAGNOSIS — Z3161 Procreative counseling and advice using natural family planning: Secondary | ICD-10-CM | POA: Diagnosis not present

## 2018-01-03 ENCOUNTER — Ambulatory Visit (INDEPENDENT_AMBULATORY_CARE_PROVIDER_SITE_OTHER): Payer: BLUE CROSS/BLUE SHIELD | Admitting: Adult Health

## 2018-01-03 ENCOUNTER — Encounter: Payer: Self-pay | Admitting: Adult Health

## 2018-01-03 VITALS — BP 116/75 | HR 72 | Temp 98.9°F | Ht 67.0 in | Wt 244.4 lb

## 2018-01-03 DIAGNOSIS — B373 Candidiasis of vulva and vagina: Secondary | ICD-10-CM

## 2018-01-03 DIAGNOSIS — B3731 Acute candidiasis of vulva and vagina: Secondary | ICD-10-CM

## 2018-01-03 MED ORDER — FLUCONAZOLE 150 MG PO TABS: 150.0000 mg | ORAL_TABLET | Freq: Once | ORAL | 0 refills | Status: AC

## 2018-01-03 MED ORDER — FLUCONAZOLE 150 MG PO TABS: ORAL_TABLET | ORAL | 1 refills | Status: DC

## 2018-01-03 NOTE — Patient Instructions (Signed)
Vaginal Yeast infection, Adult Vaginal yeast infection is a condition that causes soreness, swelling, and redness (inflammation) of the vagina. It also causes vaginal discharge. This is a common condition. Some women get this infection frequently. What are the causes? This condition is caused by a change in the normal balance of the yeast (candida) and bacteria that live in the vagina. This change causes an overgrowth of yeast, which causes the inflammation. What increases the risk? This condition is more likely to develop in:  Women who take antibiotic medicines.  Women who have diabetes.  Women who take birth control pills.  Women who are pregnant.  Women who douche often.  Women who have a weak defense (immune) system.  Women who have been taking steroid medicines for a long time.  Women who frequently wear tight clothing.  What are the signs or symptoms? Symptoms of this condition include:  White, thick vaginal discharge.  Swelling, itching, redness, and irritation of the vagina. The lips of the vagina (vulva) may be affected as well.  Pain or a burning feeling while urinating.  Pain during sex.  How is this diagnosed? This condition is diagnosed with a medical history and physical exam. This will include a pelvic exam. Your health care provider will examine a sample of your vaginal discharge under a microscope. Your health care provider may send this sample for testing to confirm the diagnosis. How is this treated? This condition is treated with medicine. Medicines may be over-the-counter or prescription. You may be told to use one or more of the following:  Medicine that is taken orally.  Medicine that is applied as a cream.  Medicine that is inserted directly into the vagina (suppository).  Follow these instructions at home:  Take or apply over-the-counter and prescription medicines only as told by your health care provider.  Do not have sex until your health  care provider has approved. Tell your sex partner that you have a yeast infection. That person should go to his or her health care provider if he or she develops symptoms.  Do not wear tight clothes, such as pantyhose or tight pants.  Avoid using tampons until your health care provider approves.  Eat more yogurt. This may help to keep your yeast infection from returning.  Try taking a sitz bath to help with discomfort. This is a warm water bath that is taken while you are sitting down. The water should only come up to your hips and should cover your buttocks. Do this 3-4 times per day or as told by your health care provider.  Do not douche.  Wear breathable, cotton underwear.  If you have diabetes, keep your blood sugar levels under control. Contact a health care provider if:  You have a fever.  Your symptoms go away and then return.  Your symptoms do not get better with treatment.  Your symptoms get worse.  You have new symptoms.  You develop blisters in or around your vagina.  You have blood coming from your vagina and it is not your menstrual period.  You develop pain in your abdomen. This information is not intended to replace advice given to you by your health care provider. Make sure you discuss any questions you have with your health care provider. Document Released: 10/22/2004 Document Revised: 06/26/2015 Document Reviewed: 07/16/2014 Elsevier Interactive Patient Education  2018 [employer].  Please take Diflucan as directed, may repeat in 7 days. Please call clinic with any questions/concerns. FEEL BETTER!

## 2018-01-03 NOTE — Assessment & Plan Note (Signed)
Please take Diflucan as directed, may repeat in 7 days. Please call clinic with any questions/concerns.

## 2018-01-03 NOTE — Progress Notes (Signed)
Subjective:    Patient ID: Caitlyn Ramos, female    DOB: 29-May-1990, 27 y.o.   MRN: [id number]  HPI:  Caitlyn Ramos presents with thick/white vaginal discharge, dysuria, and vaginal itching that began 3 days ago.  She reports using OTC Monostat without any sx relief. She reports abd cramping r/t to premenstrual sx's She denies N/V/D She denies CP/dyspnea/palpiations    Patient Care Team    Relationship Specialty Notifications Start End  Mellody Dance, DO PCP - General Family Medicine  09/24/17     Patient Active Problem List   Diagnosis Date Noted  . Amenorrhea 08/25/2017  . Health care maintenance 07/14/2016  . Vaginal yeast infection 07/14/2016  . Obesity (BMI 35.0-39.9 without comorbidity) 07/14/2016     Past Medical History:  Diagnosis Date  . Abdominal pain   . Cough   . Generalized headaches   . Nasal congestion   . Nausea   . Sore throat      Past Surgical History:  Procedure Laterality Date  . WISDOM TOOTH EXTRACTION  2009 approximate     Family History  Problem Relation Age of Onset  . Cancer Maternal Grandfather        lung     Social History   Substance and Sexual Activity  Drug Use No     Social History   Substance and Sexual Activity  Alcohol Use No     Social History   Tobacco Use  Smoking Status Never Smoker  Smokeless Tobacco Never Used     Outpatient Encounter Medications as of 01/03/2018  Medication Sig  . acetaminophen (TYLENOL) 500 MG tablet Take 500 mg by mouth as needed.  Marland Kitchen aspirin-acetaminophen-caffeine (EXCEDRIN MIGRAINE) 250-250-65 MG tablet Take by mouth as needed for headache.  . fexofenadine (ALLEGRA) 30 MG tablet Take 30 mg by mouth as needed.  . Naproxen Sodium (ALEVE PO) Take by mouth as needed.  . fluconazole (DIFLUCAN) 150 MG tablet Take 1 tablet by mouth.  May repeat dose in 7 days  . [DISCONTINUED] Prenatal Vit-Fe Fumarate-FA (MULTIVITAMIN-PRENATAL) 27-0.8 MG TABS tablet Take 1 tablet by mouth daily at 12  noon.   No facility-administered encounter medications on file as of 01/03/2018.     Allergies: Patient has no known allergies.  Body mass index is 38.28 kg/m.  Blood pressure 116/75, pulse 72, temperature 98.9 F (37.2 C), temperature source Oral, height 5\' 7"  (1.702 m), weight 244 lb 6.4 oz (110.9 kg), last menstrual period 12/07/2017, not currently breastfeeding.  Review of Systems  Constitutional: Positive for fatigue. Negative for activity change, appetite change, chills, diaphoresis, fever and unexpected weight change.  Gastrointestinal: Negative for abdominal distention, abdominal pain, blood in stool, constipation, diarrhea, nausea and vomiting.  Genitourinary: Positive for dysuria and vaginal discharge. Negative for vaginal pain.  Hematological: Does not bruise/bleed easily.       Objective:   Physical Exam  Constitutional: She appears well-developed and well-nourished. No distress.  HENT:  Head: Normocephalic and atraumatic.  Right Ear: External ear normal.  Left Ear: External ear normal.  Nose: Nose normal.  Mouth/Throat: Oropharynx is clear and moist.  Eyes: Pupils are equal, round, and reactive to light. Conjunctivae and EOM are normal.  Neck: Normal range of motion. Neck supple.  Cardiovascular: Normal rate, regular rhythm, normal heart sounds and intact distal pulses. Exam reveals no gallop and no friction rub.  No murmur heard. Pulmonary/Chest: Effort normal and breath sounds normal. No stridor. No respiratory distress. She has no wheezes. She has  no rales. She exhibits no tenderness.  Abdominal: Soft. Bowel sounds are normal. She exhibits no distension and no mass. There is no tenderness. There is no rebound, no guarding and no CVA tenderness. No hernia.  Lymphadenopathy:    She has no cervical adenopathy.  Skin: Skin is warm and dry. Capillary refill takes less than 2 seconds. No rash noted. She is not diaphoretic. No erythema. No pallor.  Psychiatric: She has  a normal mood and affect. Her behavior is normal. Judgment and thought content normal.  Nursing note and vitals reviewed.     Assessment & Plan:   1. Vaginal yeast infection     Vaginal yeast infection Please take Diflucan as directed, may repeat in 7 days. Please call clinic with any questions/concerns.    FOLLOW-UP:  Return if symptoms worsen or fail to improve.

## 2018-01-03 NOTE — Addendum Note (Signed)
Addended by: Mina Marble D on: 01/03/2018 04:05 PM   Modules accepted: Orders

## 2018-01-06 ENCOUNTER — Telehealth: Payer: Self-pay | Admitting: Family Medicine

## 2018-01-06 NOTE — Telephone Encounter (Signed)
Patient states she was seen Monday for acute visit and was prescribed diflucan with one refill. When she went to pick up this med they gave it to her and stated no refills, she is now in need of this refill. If approved please send order to [facility] on [address]

## 2018-01-07 ENCOUNTER — Other Ambulatory Visit: Payer: Self-pay | Admitting: Adult Health

## 2018-01-18 DIAGNOSIS — E288 Other ovarian dysfunction: Secondary | ICD-10-CM | POA: Diagnosis not present

## 2018-01-18 DIAGNOSIS — Z319 Encounter for procreative management, unspecified: Secondary | ICD-10-CM | POA: Diagnosis not present

## 2018-01-24 NOTE — Telephone Encounter (Signed)
Per pt, Rx with refill was sent to [facility] in [place], rather than [facility] on [address].  Pt states that she was able to get the RX transferred and obtain the refill.  Charyl Bigger, CMA

## 2018-01-26 DIAGNOSIS — E0590 Thyrotoxicosis, unspecified without thyrotoxic crisis or storm: Secondary | ICD-10-CM

## 2018-01-26 HISTORY — DX: Thyrotoxicosis, unspecified without thyrotoxic crisis or storm: E05.90

## 2018-01-27 DIAGNOSIS — D259 Leiomyoma of uterus, unspecified: Secondary | ICD-10-CM | POA: Diagnosis not present

## 2018-01-27 DIAGNOSIS — E288 Other ovarian dysfunction: Secondary | ICD-10-CM | POA: Diagnosis not present

## 2018-02-07 DIAGNOSIS — Z3200 Encounter for pregnancy test, result unknown: Secondary | ICD-10-CM | POA: Diagnosis not present

## 2018-02-07 DIAGNOSIS — E288 Other ovarian dysfunction: Secondary | ICD-10-CM | POA: Diagnosis not present

## 2018-02-09 DIAGNOSIS — Z3200 Encounter for pregnancy test, result unknown: Secondary | ICD-10-CM | POA: Diagnosis not present

## 2018-02-22 DIAGNOSIS — Z3200 Encounter for pregnancy test, result unknown: Secondary | ICD-10-CM | POA: Diagnosis not present

## 2018-03-07 DIAGNOSIS — O0900 Supervision of pregnancy with history of infertility, unspecified trimester: Secondary | ICD-10-CM | POA: Diagnosis not present

## 2018-03-14 DIAGNOSIS — Z3481 Encounter for supervision of other normal pregnancy, first trimester: Secondary | ICD-10-CM | POA: Diagnosis not present

## 2018-03-14 DIAGNOSIS — Z3685 Encounter for antenatal screening for Streptococcus B: Secondary | ICD-10-CM | POA: Diagnosis not present

## 2018-03-14 DIAGNOSIS — Z3401 Encounter for supervision of normal first pregnancy, first trimester: Secondary | ICD-10-CM | POA: Diagnosis not present

## 2018-03-14 LAB — OB RESULTS CONSOLE RUBELLA ANTIBODY, IGM: Rubella: IMMUNE

## 2018-03-14 LAB — OB RESULTS CONSOLE HEPATITIS B SURFACE ANTIGEN: Hepatitis B Surface Ag: NEGATIVE

## 2018-03-14 LAB — OB RESULTS CONSOLE RPR: RPR: NONREACTIVE

## 2018-03-14 LAB — OB RESULTS CONSOLE HIV ANTIBODY (ROUTINE TESTING): HIV: NONREACTIVE

## 2018-03-16 DIAGNOSIS — O2341 Unspecified infection of urinary tract in pregnancy, first trimester: Secondary | ICD-10-CM | POA: Diagnosis not present

## 2018-03-16 DIAGNOSIS — R1030 Lower abdominal pain, unspecified: Secondary | ICD-10-CM | POA: Diagnosis not present

## 2018-03-16 DIAGNOSIS — N390 Urinary tract infection, site not specified: Secondary | ICD-10-CM | POA: Diagnosis not present

## 2018-03-16 DIAGNOSIS — O26891 Other specified pregnancy related conditions, first trimester: Secondary | ICD-10-CM | POA: Diagnosis not present

## 2018-03-16 DIAGNOSIS — Z3A09 9 weeks gestation of pregnancy: Secondary | ICD-10-CM | POA: Diagnosis not present

## 2018-03-16 DIAGNOSIS — O30041 Twin pregnancy, dichorionic/diamniotic, first trimester: Secondary | ICD-10-CM | POA: Diagnosis not present

## 2018-03-21 DIAGNOSIS — Z331 Pregnant state, incidental: Secondary | ICD-10-CM | POA: Diagnosis not present

## 2018-03-21 DIAGNOSIS — Z113 Encounter for screening for infections with a predominantly sexual mode of transmission: Secondary | ICD-10-CM | POA: Diagnosis not present

## 2018-03-21 DIAGNOSIS — Z3400 Encounter for supervision of normal first pregnancy, unspecified trimester: Secondary | ICD-10-CM | POA: Diagnosis not present

## 2018-03-21 DIAGNOSIS — Z124 Encounter for screening for malignant neoplasm of cervix: Secondary | ICD-10-CM | POA: Diagnosis not present

## 2018-04-04 DIAGNOSIS — Z3482 Encounter for supervision of other normal pregnancy, second trimester: Secondary | ICD-10-CM | POA: Diagnosis not present

## 2018-04-04 DIAGNOSIS — Z3682 Encounter for antenatal screening for nuchal translucency: Secondary | ICD-10-CM | POA: Diagnosis not present

## 2018-04-04 DIAGNOSIS — Z3A12 12 weeks gestation of pregnancy: Secondary | ICD-10-CM | POA: Diagnosis not present

## 2018-05-11 DIAGNOSIS — Z363 Encounter for antenatal screening for malformations: Secondary | ICD-10-CM | POA: Diagnosis not present

## 2018-05-11 DIAGNOSIS — O30042 Twin pregnancy, dichorionic/diamniotic, second trimester: Secondary | ICD-10-CM | POA: Diagnosis not present

## 2018-05-11 DIAGNOSIS — Z3A17 17 weeks gestation of pregnancy: Secondary | ICD-10-CM | POA: Diagnosis not present

## 2018-05-31 DIAGNOSIS — Z3A20 20 weeks gestation of pregnancy: Secondary | ICD-10-CM | POA: Diagnosis not present

## 2018-05-31 DIAGNOSIS — Z362 Encounter for other antenatal screening follow-up: Secondary | ICD-10-CM | POA: Diagnosis not present

## 2018-06-29 DIAGNOSIS — O30032 Twin pregnancy, monochorionic/diamniotic, second trimester: Secondary | ICD-10-CM | POA: Diagnosis not present

## 2018-06-29 DIAGNOSIS — Z3A24 24 weeks gestation of pregnancy: Secondary | ICD-10-CM | POA: Diagnosis not present

## 2018-07-04 DIAGNOSIS — O26872 Cervical shortening, second trimester: Secondary | ICD-10-CM | POA: Diagnosis not present

## 2018-07-04 DIAGNOSIS — Z3A25 25 weeks gestation of pregnancy: Secondary | ICD-10-CM | POA: Diagnosis not present

## 2018-07-11 DIAGNOSIS — Z3A26 26 weeks gestation of pregnancy: Secondary | ICD-10-CM | POA: Diagnosis not present

## 2018-07-11 DIAGNOSIS — O26879 Cervical shortening, unspecified trimester: Secondary | ICD-10-CM | POA: Diagnosis not present

## 2018-07-15 DIAGNOSIS — Z23 Encounter for immunization: Secondary | ICD-10-CM | POA: Diagnosis not present

## 2018-07-15 DIAGNOSIS — O360191 Maternal care for anti-D [Rh] antibodies, unspecified trimester, fetus 1: Secondary | ICD-10-CM | POA: Diagnosis not present

## 2018-07-15 DIAGNOSIS — Z3A27 27 weeks gestation of pregnancy: Secondary | ICD-10-CM | POA: Diagnosis not present

## 2018-07-15 DIAGNOSIS — O26872 Cervical shortening, second trimester: Secondary | ICD-10-CM | POA: Diagnosis not present

## 2018-07-15 DIAGNOSIS — O30042 Twin pregnancy, dichorionic/diamniotic, second trimester: Secondary | ICD-10-CM | POA: Diagnosis not present

## 2018-07-15 DIAGNOSIS — Z3480 Encounter for supervision of other normal pregnancy, unspecified trimester: Secondary | ICD-10-CM | POA: Diagnosis not present

## 2018-07-16 ENCOUNTER — Inpatient Hospital Stay (HOSPITAL_COMMUNITY)
Admission: AD | Admit: 2018-07-16 | Discharge: 2018-07-16 | Disposition: A | Discharge status: Home / Self Care | Payer: BC Managed Care – PPO | Source: Ambulatory Visit | Attending: Obstetrics & Gynecology | Admitting: Obstetrics & Gynecology

## 2018-07-16 ENCOUNTER — Encounter (HOSPITAL_COMMUNITY): Payer: Self-pay

## 2018-07-16 ENCOUNTER — Other Ambulatory Visit: Payer: Self-pay

## 2018-07-16 DIAGNOSIS — Z3A27 27 weeks gestation of pregnancy: Secondary | ICD-10-CM | POA: Insufficient documentation

## 2018-07-16 MED ORDER — BETAMETHASONE SOD PHOS & ACET 6 (3-3) MG/ML IJ SUSP
12.0000 mg | Freq: Once | INTRAMUSCULAR | Status: AC
  Administered 2018-07-16: 12 mg via INTRAMUSCULAR
  Filled 2018-07-16: qty 2

## 2018-07-16 NOTE — MAU Provider Note (Signed)
Pt here for betamethoasone injection d/t twins and cervix <1cm on [place]. Pt denies s/sx of PTL, VB, LOF, ctx. Order not entered by private physician for betamethasone, entered by MAU provider. Will be administered by RN and treated as RN only visit after medical screening by provider.  Clarisa Fling, NP  12:23 PM 07/16/2018

## 2018-07-16 NOTE — MAU Note (Signed)
Caitlyn Ramos is a 28 y.o. at 105w1d here in MAU reporting: has a short cervix, reports she got BMZ yesterday in the office and needs 2nd dose today. Reports no other complaints. + FM  Pain score: 0/10  Vitals:   07/16/18 1212  BP: 125/68  Pulse: (!) 109  Resp: 18  Temp: 98 F (36.7 C)  SpO2: 100%     FHT: Baby A 138 Baby B 140  Lab orders placed from triage: none

## 2018-07-18 ENCOUNTER — Observation Stay (HOSPITAL_COMMUNITY)
Admission: AD | Admit: 2018-07-18 | Discharge: 2018-07-20 | Disposition: A | Discharge status: Home / Self Care | Payer: BC Managed Care – PPO | Attending: Obstetrics and Gynecology | Admitting: Obstetrics and Gynecology

## 2018-07-18 ENCOUNTER — Encounter (HOSPITAL_COMMUNITY): Payer: Self-pay | Admitting: *Deleted

## 2018-07-18 ENCOUNTER — Inpatient Hospital Stay (HOSPITAL_BASED_OUTPATIENT_CLINIC_OR_DEPARTMENT_OTHER): Payer: BC Managed Care – PPO

## 2018-07-18 ENCOUNTER — Other Ambulatory Visit: Payer: Self-pay

## 2018-07-18 DIAGNOSIS — O26873 Cervical shortening, third trimester: Secondary | ICD-10-CM

## 2018-07-18 DIAGNOSIS — O30043 Twin pregnancy, dichorionic/diamniotic, third trimester: Secondary | ICD-10-CM

## 2018-07-18 DIAGNOSIS — Z3A27 27 weeks gestation of pregnancy: Secondary | ICD-10-CM | POA: Insufficient documentation

## 2018-07-18 DIAGNOSIS — Z03818 Encounter for observation for suspected exposure to other biological agents ruled out: Secondary | ICD-10-CM | POA: Insufficient documentation

## 2018-07-18 DIAGNOSIS — R102 Pelvic and perineal pain: Secondary | ICD-10-CM | POA: Diagnosis not present

## 2018-07-18 HISTORY — DX: Benign neoplasm of connective and other soft tissue, unspecified: D21.9

## 2018-07-18 HISTORY — DX: Polycystic ovarian syndrome: E28.2

## 2018-07-18 LAB — CBC
HCT: 36.3 % (ref 36.0–46.0)
Hemoglobin: 11.8 g/dL — ABNORMAL LOW (ref 12.0–15.0)
MCH: 30.5 pg (ref 26.0–34.0)
MCHC: 32.5 g/dL (ref 30.0–36.0)
MCV: 93.8 fL (ref 80.0–100.0)
Platelets: 339 10*3/uL (ref 150–400)
RBC: 3.87 MIL/uL (ref 3.87–5.11)
RDW: 13.2 % (ref 11.5–15.5)
WBC: 19.6 10*3/uL — ABNORMAL HIGH (ref 4.0–10.5)
nRBC: 0 % (ref 0.0–0.2)

## 2018-07-18 LAB — URINALYSIS, ROUTINE W REFLEX MICROSCOPIC
Bilirubin Urine: NEGATIVE
Glucose, UA: NEGATIVE mg/dL
Hgb urine dipstick: NEGATIVE
Ketones, ur: NEGATIVE mg/dL
Nitrite: NEGATIVE
Protein, ur: NEGATIVE mg/dL
Specific Gravity, Urine: 1.015 (ref 1.005–1.030)
pH: 6 (ref 5.0–8.0)

## 2018-07-18 LAB — WET PREP, GENITAL
Clue Cells Wet Prep HPF POC: NONE SEEN
Sperm: NONE SEEN
Trich, Wet Prep: NONE SEEN
Yeast Wet Prep HPF POC: NONE SEEN

## 2018-07-18 LAB — SARS CORONAVIRUS 2 BY RT PCR (HOSPITAL ORDER, PERFORMED IN ~~LOC~~ HOSPITAL LAB): SARS Coronavirus 2: NEGATIVE

## 2018-07-18 LAB — TYPE AND SCREEN
ABO/RH(D): A POS
Antibody Screen: NEGATIVE

## 2018-07-18 LAB — ABO/RH: ABO/RH(D): A POS

## 2018-07-18 MED ORDER — MAGNESIUM SULFATE 40 G IN LACTATED RINGERS - SIMPLE
INTRAVENOUS | Status: AC
  Administered 2018-07-18: 18:00:00
  Filled 2018-07-18: qty 500

## 2018-07-18 MED ORDER — NIFEDIPINE 10 MG PO CAPS
10.0000 mg | ORAL_CAPSULE | ORAL | Status: DC | PRN
  Administered 2018-07-18: 10 mg via ORAL
  Filled 2018-07-18: qty 1

## 2018-07-18 MED ORDER — MAGNESIUM SULFATE BOLUS VIA INFUSION
4.0000 g | Freq: Once | INTRAVENOUS | Status: AC
  Administered 2018-07-18: 4 g via INTRAVENOUS
  Filled 2018-07-18: qty 500

## 2018-07-18 MED ORDER — LACTATED RINGERS IV SOLN
INTRAVENOUS | Status: DC
  Administered 2018-07-18 – 2018-07-20 (×5): via INTRAVENOUS

## 2018-07-18 MED ORDER — PRENATAL MULTIVITAMIN CH
1.0000 | ORAL_TABLET | Freq: Every day | ORAL | Status: DC
  Filled 2018-07-18 (×2): qty 1

## 2018-07-18 MED ORDER — BUTALBITAL-APAP-CAFFEINE 50-325-40 MG PO TABS
1.0000 | ORAL_TABLET | Freq: Three times a day (TID) | ORAL | Status: DC | PRN
  Administered 2018-07-18 – 2018-07-19 (×2): 2 via ORAL
  Filled 2018-07-18 (×2): qty 2

## 2018-07-18 MED ORDER — LACTATED RINGERS IV BOLUS
1000.0000 mL | Freq: Once | INTRAVENOUS | Status: AC
  Administered 2018-07-18: 1000 mL via INTRAVENOUS

## 2018-07-18 MED ORDER — ZOLPIDEM TARTRATE 5 MG PO TABS: 5.0000 mg | ORAL_TABLET | Freq: Every evening | ORAL | Status: DC | PRN

## 2018-07-18 MED ORDER — CALCIUM CARBONATE ANTACID 500 MG PO CHEW
2.0000 | CHEWABLE_TABLET | ORAL | Status: DC | PRN
  Administered 2018-07-20: 400 mg via ORAL
  Filled 2018-07-18: qty 2

## 2018-07-18 MED ORDER — ACETAMINOPHEN 325 MG PO TABS
650.0000 mg | ORAL_TABLET | ORAL | Status: DC | PRN
  Administered 2018-07-18: 650 mg via ORAL
  Filled 2018-07-18: qty 2

## 2018-07-18 MED ORDER — DOCUSATE SODIUM 100 MG PO CAPS
100.0000 mg | ORAL_CAPSULE | Freq: Every day | ORAL | Status: DC
  Administered 2018-07-19 – 2018-07-20 (×2): 100 mg via ORAL
  Filled 2018-07-18 (×2): qty 1

## 2018-07-18 MED ORDER — MAGNESIUM SULFATE 40 G IN LACTATED RINGERS - SIMPLE
2.0000 g/h | INTRAVENOUS | Status: DC
  Administered 2018-07-18 – 2018-07-19 (×2): 2 g/h via INTRAVENOUS
  Filled 2018-07-18 (×2): qty 500

## 2018-07-18 NOTE — H&P (Signed)
Makita Blow is a 28 y.o. female G2P1 @ 27+3 wks with twin pregnancy presenting for evaluation of PTL.  Pt has been followed in office with serial CL d/t painless cervical effacement.  Friday, cervical length was 76mm & closed on exam.  No pain or ctx.  She was given outpatient BMZ series.  2 days ago, she began to experience an increase in pelvic pressure & pain that has continued into today.  No vb or lof.  + FM x 2   OB History    Gravida  2   Para      Term      Preterm      AB  1   Living        SAB  1   TAB      Ectopic      Multiple      Live Births             Past Medical History:  Diagnosis Date  . Abdominal pain   . Cough   . Fibroid   . Generalized headaches   . Nasal congestion   . Nausea   . PCOS (polycystic ovarian syndrome)   . Sore throat    Past Surgical History:  Procedure Laterality Date  . WISDOM TOOTH EXTRACTION  2009 approximate   Family History: family history includes Cancer in her maternal grandfather; Heart disease in her father; Hypertension in her father; Thyroid disease in her father. Social History:  reports that she has never smoked. She has never used smokeless tobacco. She reports that she does not drink alcohol or use drugs.     Maternal Diabetes: No Genetic Screening: Declined Maternal Ultrasounds/Referrals: Normal Fetal Ultrasounds or other Referrals:  None Maternal Substance Abuse:  No Significant Maternal Medications:  None Significant Maternal Lab Results:  None Other Comments:  None  ROS History Dilation: 1.5 Effacement (%): Thick Exam by:: K. Kooistra Blood pressure 138/77, pulse (!) 102, temperature 98 F (36.7 C), temperature source Oral, resp. rate 20, height 5\' 7"  (1.702 m), weight (!) 143.4 kg, last menstrual period 12/07/2017, SpO2 100 %. Exam Physical Exam  Gen - appears uncomfortable Abd - gravid, NT Ext - NT, no edema PV - cervix 1/100/-2  [place] 07/15/18:  (A) vtx 921 gms, 59%, (B) breech 968 gms,  68%. Normal AFV. CL 7.60mm   TVUS 07/18/18:  CL 35mm w/ funneling.  Prenatal labs: ABO, Rh: --/--/A POS (06/22 1500) Antibody: NEG (06/22 1500) Rubella:   RPR:    HBsAg:    HIV:    GBS:     Assessment/Plan: 27 wk twin gestation with PTL Admit for Mag & observation S/p BMZ 6/19, 6/20 Vtx/breech - c-section for delivery    Marylynn Pearson 07/18/2018, 4:48 PM

## 2018-07-18 NOTE — MAU Provider Note (Addendum)
Patient Caitlyn Ramos is a 28 y.o.  G2P0010 At [redacted]w[redacted]d here with complaints of suprapubic pressure and lower back pain and cramps. The cramps started at 9:30 last night, and she started having heavy pressure over the weekend.   She denies dysuria, LOF, vaginal bleeding, decreased fetal movements. She denies fever, SOB, body aches, chills, Covid-19 symptoms.   Patient reports that 7 days ago her cervical length measured 1.7 cm on 6-15, as measured at [facility]. She had a repeat US on 07-15-2018 that showed now cervix was 7.46mm.   She was seen at [facility] today and was manually examined by Dr. Julien Girt and was told that her cervix was 1 cm, and she was sent to MAU for preterm labor rule out.  History     CSN: [id number]  Arrival date and time: 07/18/18 1243   First Provider Initiated Contact with Patient 07/18/18 1401      Chief Complaint  Patient presents with  . Contractions  . Back Pain   Abdominal Pain This is a new problem. The current episode started yesterday. The problem occurs intermittently. The problem has been unchanged. The pain is located in the suprapubic region. The pain is at a severity of 7/10. Pain radiation: radiates from her lower back to her suprapubic region. Pertinent negatives include no constipation, diarrhea, dysuria, nausea or vomiting. Exacerbated by: standing. Relieved by: leaning back.    OB History    Gravida  2   Para      Term      Preterm      AB  1   Living        SAB  1   TAB      Ectopic      Multiple      Live Births              Past Medical History:  Diagnosis Date  . Abdominal pain   . Cough   . Fibroid   . Generalized headaches   . Nasal congestion   . Nausea   . PCOS (polycystic ovarian syndrome)   . Sore throat     Past Surgical History:  Procedure Laterality Date  . WISDOM TOOTH EXTRACTION  2009 approximate    Family History  Problem Relation Age of Onset  . Heart disease Father   .  Hypertension Father   . Thyroid disease Father   . Cancer Maternal Grandfather        lung    Social History   Tobacco Use  . Smoking status: Never Smoker  . Smokeless tobacco: Never Used  Substance Use Topics  . Alcohol use: No  . Drug use: No    Allergies: No Known Allergies  Medications Prior to Admission  Medication Sig Dispense Refill Last Dose  . folic acid (FOLVITE) 1 MG tablet Take 1 mg by mouth daily.   07/17/2018 at 2330  . prenatal vitamin w/FE, FA (PRENATAL 1 + 1) 27-1 MG TABS tablet Take 1 tablet by mouth daily at 12 noon.   07/17/2018 at 2300  . acetaminophen (TYLENOL) 500 MG tablet Take 500 mg by mouth as needed.   07/16/2018  . aspirin-acetaminophen-caffeine (EXCEDRIN MIGRAINE) 250-250-65 MG tablet Take by mouth as needed for headache.     . fexofenadine (ALLEGRA) 30 MG tablet Take 30 mg by mouth as needed.     . fluconazole (DIFLUCAN) 150 MG tablet Take 1 tablet by mouth.  May repeat dose in 7 days  1 tablet 1   . Naproxen Sodium (ALEVE PO) Take by mouth as needed.       Review of Systems  HENT: Negative.   Gastrointestinal: Positive for abdominal pain. Negative for constipation, diarrhea, nausea and vomiting.  Genitourinary: Negative for dysuria.  Neurological: Negative.   Psychiatric/Behavioral: Negative.    Physical Exam   Blood pressure 102/61, pulse (!) 114, temperature 98 F (36.7 C), temperature source Oral, resp. rate 20, height 5\' 7"  (1.702 m), weight (!) 143.4 kg, last menstrual period 12/07/2017, SpO2 100 %.  Physical Exam  Constitutional: She appears well-developed.  Neck: Normal range of motion.  GI: Soft.  Genitourinary:    Genitourinary Comments: NEFG; cervix is about 1.5-2 cm; middle position, medium consistency.    Musculoskeletal: Normal range of motion.  Skin: Skin is warm and dry.    MAU Course  Procedures  MDM -NST: Baby A: 150 bpm, Baby B: 145 bpm, mod var, present acel, neg decels, occasional irregular contractions.   -Transvaginal shows cervical length now 0.6 cm (87mm, which is a decreased of 7 mm on Friday).  Baby A: vertex and Baby B: breech -Patient felt some relief with procardia x 1 -IV Fluid bolus given   Assessment and Plan   1. Preterm labor    2. Discussed with Dr. Julien Girt, who recommends admission for MgSo4. Dr. Julien Girt to take over management of patient.   3. Discussed with patient, who agrees with plan of care.    Mervyn Skeeters Kooistra 07/18/2018, 2:01 PM

## 2018-07-18 NOTE — Progress Notes (Signed)
GC/Chlamydia & wet prep cultures obtained by K. Kooistra, CNM °

## 2018-07-18 NOTE — MAU Note (Signed)
Pt sent from office for PTL evaluation.  Pt reports cervix 1 cm dilated @ office today.  Has Hx of short cervix with this pregnancy.  Received BMZ x2 doses (past Friday & Saturday)

## 2018-07-18 NOTE — Plan of Care (Signed)
progressing 

## 2018-07-19 LAB — RPR: RPR Ser Ql: NONREACTIVE

## 2018-07-19 LAB — GC/CHLAMYDIA PROBE AMP (~~LOC~~) NOT AT ARMC
Chlamydia: NEGATIVE
Neisseria Gonorrhea: NEGATIVE

## 2018-07-19 NOTE — Progress Notes (Signed)
S: Very comfortable. Reports she did have contractions/cramping for about an hour after Mag was started. But then became comfortable. Continues to feel pressure, however.   O:AFVSS Gen: A&O, NAD Pulm: NWOB CV: Reg rate Abd: soft, obese, nontender, nondistended SVE 3/100/-2  A/P: 28yo G2P0010 @ 27.4 wga with di/di twins presenting in PTL. Followed in office with serial CL and on Friday cervix was closed. Presented ~1.5cm and 100% effacement per Dr. Julien Girt. This am she is a tight 3/100/-2. Given cervical change, continue Magnesium. Suspect cervical change may have occurred prior to [place] starting given how comf patient has been. [place]oco has been mostly quiet overnight.  Will ctm very closely.   - Continue Mag for CP prophylaxis and contraction suppression - 15 min mag break to allow a short shower with assistant - S/p BMZ 6/19-6/20 - Cont CEFM - ROD d/w pt - given v/breech and EFW both <1000g on last [place], patient for CS - Continue inpatient obs - GBS done and pending, defer PCN for now. If continue to make cervical change, would initiate  Lucillie Garfinkel MD

## 2018-07-20 LAB — CULTURE, BETA STREP (GROUP B ONLY)

## 2018-07-20 NOTE — Progress Notes (Signed)
Passed a mucous plug in toilet. No blood. No leaking. Wants to go home  Today's Vitals   07/20/18 0600 07/20/18 0753 07/20/18 0800 07/20/18 1113  BP:  117/61  131/65  Pulse:  81  93  Resp: 16 18  20   Temp:  98.3 F (36.8 C)  98.1 F (36.7 C)  TempSrc:  Oral  Oral  SpO2:  100%  100%  Weight:      Height:      PainSc:   0-No pain    Body mass index is 49.52 kg/m.   Cx 2/90/high  FHT +accel x 2 UCs none tracing  A/P: PTL now stopped, BMZ done 6/19 & 6/20         D/C home-limited activity D/W patient         C/S for delivery discussed         FU office within 1 week

## 2018-07-20 NOTE — Progress Notes (Signed)
Discharge instructions reviewed with patients. No medications added at this time.  Patient understands discharge instructions from Dr Gaetano Net.

## 2018-07-20 NOTE — Plan of Care (Signed)
  Problem: Education: Goal: Knowledge of disease or condition will improve Outcome: Completed/Met Goal: Knowledge of the prescribed therapeutic regimen will improve Outcome: Completed/Met   Problem: Clinical Measurements: Goal: Complications related to the disease process, condition or treatment will be avoided or minimized Outcome: Progressing Note: Pt denies UC upon assessment. None noted on toco

## 2018-07-22 NOTE — Discharge Summary (Signed)
Physician Discharge Summary  Patient ID: Caitlyn Ramos MRN: [id number] DOB/AGE: 22-Sep-1990 28 y.o.  Admit date: 07/18/2018 Discharge date: 07/22/2018  Admission Diagnoses:Preterm Labor  Discharge Diagnoses:  Active Problems:   Preterm labor   Discharged Condition: good  Hospital Course: 28 yo G2P1 with Di/Di twins @ 28 0/7 weeks with contractions and cervical change. S/P BMZ prior to this admission when cervix was noted to be shortening on U/S. Admitted and received magnesium sulfate which stopped UCs. Cx = 2/90/high on day of discharge.   Consults: None  Significant Diagnostic Studies: labs:  Recent Results (from the past 2160 hour(s))  GC/Chlamydia probe amp ([place])not at [facility]     Status: None   Collection Time: 07/18/18 12:00 AM  Result Value Ref Range   Chlamydia Negative     Comment: Normal Reference Range - Negative   Neisseria gonorrhea Negative     Comment: Normal Reference Range - Negative  Urinalysis, Routine w reflex microscopic     Status: Abnormal   Collection Time: 07/18/18  1:27 PM  Result Value Ref Range   Color, Urine YELLOW YELLOW   APPearance HAZY (A) CLEAR   Specific Gravity, Urine 1.015 1.005 - 1.030   pH 6.0 5.0 - 8.0   Glucose, UA NEGATIVE NEGATIVE mg/dL   Hgb urine dipstick NEGATIVE NEGATIVE   Bilirubin Urine NEGATIVE NEGATIVE   Ketones, ur NEGATIVE NEGATIVE mg/dL   Protein, ur NEGATIVE NEGATIVE mg/dL   Nitrite NEGATIVE NEGATIVE   Leukocytes,Ua SMALL (A) NEGATIVE   RBC / HPF 0-5 0 - 5 RBC/hpf   WBC, UA 0-5 0 - 5 WBC/hpf   Bacteria, UA RARE (A) NONE SEEN   Squamous Epithelial / LPF 0-5 0 - 5    Comment: Performed at [facility], [address], [place], [place] [address]  Wet prep, genital     Status: Abnormal   Collection Time: 07/18/18  2:20 PM   Specimen: Vaginal  Result Value Ref Range   Yeast Wet Prep HPF POC NONE SEEN NONE SEEN   Trich, Wet Prep NONE SEEN NONE SEEN   Clue Cells Wet Prep HPF POC NONE SEEN NONE SEEN   WBC, Wet  Prep HPF POC MANY (A) NONE SEEN   Sperm NONE SEEN     Comment: Performed at [facility], [place] [address] [place], [place] [address]  Type and screen     Status: None   Collection Time: 07/18/18  3:00 PM  Result Value Ref Range   ABO/RH(D) A POS    Antibody Screen NEG    Sample Expiration      07/21/2018,2359 Performed at [facility], [place] [address] [place], [place] [address]   ABO/Rh     Status: None   Collection Time: 07/18/18  3:00 PM  Result Value Ref Range   ABO/RH(D)      A POS Performed at [facility] [address], [place], [place] [address]   CBC     Status: Abnormal   Collection Time: 07/18/18  3:06 PM  Result Value Ref Range   WBC 19.6 (H) 4.0 - 10.5 K/uL   RBC 3.87 3.87 - 5.11 MIL/uL   Hemoglobin 11.8 (L) 12.0 - 15.0 g/dL   HCT 36.3 36.0 - 46.0 %   MCV 93.8 80.0 - 100.0 fL   MCH 30.5 26.0 - 34.0 pg   MCHC 32.5 30.0 - 36.0 g/dL   RDW 13.2 11.5 - 15.5 %   Platelets  339 150 - 400 K/uL   nRBC 0.0 0.0 - 0.2 %    Comment: Performed at [facility], [place] [address], [place], [place] [address]  RPR     Status: None   Collection Time: 07/18/18  3:06 PM  Result Value Ref Range   RPR Ser Ql Non Reactive Non Reactive    Comment: (NOTE) Performed At: [facility] [place], [place] [id number] Rush Farmer MD EX:[id number]   Culture, beta strep (group b only)     Status: None   Collection Time: 07/18/18  4:58 PM   Specimen: Vaginal/Rectal; Genital  Result Value Ref Range   Specimen Description VAGINAL/RECTAL    Special Requests NONE    Culture      NO GROUP B STREP (S.AGALACTIAE) ISOLATED Performed at [facility] [address], [place], [place] [address]    Report Status 07/20/2018 FINAL   SARS Coronavirus 2 (CEPHEID - Performed in [place] hospital lab), Hosp Order     Status: None   Collection Time: 07/18/18  4:58 PM   Specimen: Nasopharyngeal Swab  Result Value Ref Range   SARS Coronavirus 2  NEGATIVE NEGATIVE    Comment: (NOTE) If result is NEGATIVE SARS-CoV-2 target nucleic acids are NOT DETECTED. The SARS-CoV-2 RNA is generally detectable in upper and lower  respiratory specimens during the acute phase of infection. The lowest  concentration of SARS-CoV-2 viral copies this assay can detect is 250  copies / mL. A negative result does not preclude SARS-CoV-2 infection  and should not be used as the sole basis for treatment or other  patient management decisions.  A negative result may occur with  improper specimen collection / handling, submission of specimen other  than nasopharyngeal swab, presence of viral mutation(s) within the  areas targeted by this assay, and inadequate number of viral copies  (<250 copies / mL). A negative result must be combined with clinical  observations, patient history, and epidemiological information. If result is POSITIVE SARS-CoV-2 target nucleic acids are DETECTED. The SARS-CoV-2 RNA is generally detectable in upper and lower  respiratory specimens dur ing the acute phase of infection.  Positive  results are indicative of active infection with SARS-CoV-2.  Clinical  correlation with patient history and other diagnostic information is  necessary to determine patient infection status.  Positive results do  not rule out bacterial infection or co-infection with other viruses. If result is PRESUMPTIVE POSTIVE SARS-CoV-2 nucleic acids MAY BE PRESENT.   A presumptive positive result was obtained on the submitted specimen  and confirmed on repeat testing.  While 2019 novel coronavirus  (SARS-CoV-2) nucleic acids may be present in the submitted sample  additional confirmatory testing may be necessary for epidemiological  and / or clinical management purposes  to differentiate between  SARS-CoV-2 and other Sarbecovirus currently known to infect humans.  If clinically indicated additional testing with an alternate test  methodology [phone]) is  advised. The SARS-CoV-2 RNA is generally  detectable in upper and lower respiratory sp ecimens during the acute  phase of infection. The expected result is Negative. Fact Sheet for Patients:  StrictlyIdeas.no Fact Sheet for Healthcare Providers: BankingDealers.co.za This test is not yet approved or cleared by the [place] FDA and has been authorized for detection and/or diagnosis of SARS-CoV-2 by FDA under an Emergency Use Authorization (EUA).  This EUA will remain in effect (meaning this test can be used) for the duration of the COVID-19 declaration under Section  564(b)(1) of the Act, 21 U.S.C. section 360bbb-3(b)(1), unless the authorization is terminated or revoked sooner. Performed at [facility], [place] [address], [place], [place] [address]     Treatments: IV hydration and magnesium sulfate  Discharge Exam: Blood pressure 131/65, pulse 93, temperature 98.1 F (36.7 C), temperature source Oral, resp. rate 20, height 5\' 7"  (1.702 m), weight (!) 143.4 kg, last menstrual period 12/07/2017, SpO2 100 %. General appearance: alert, cooperative and no distress  Disposition: Discharge disposition: 01-Home or Self Care       Discharge Instructions    Discharge patient   Complete by: As directed    Discharge disposition: 01-Home or Self Care   Discharge patient date: 07/20/2018     Allergies as of 07/20/2018   No Known Allergies     Medication List    STOP taking these medications   ALEVE PO   aspirin-acetaminophen-caffeine 680-321-22 MG tablet Commonly known as: EXCEDRIN MIGRAINE   fexofenadine 30 MG tablet Commonly known as: ALLEGRA   fluconazole 150 MG tablet Commonly known as: DIFLUCAN     TAKE these medications   folic acid 1 MG tablet Commonly known as: FOLVITE Take 1 mg by mouth daily.   prenatal vitamin w/FE, FA 27-1 MG Tabs tablet Take 1 tablet by mouth daily at 12 noon.   TYLENOL 500 MG  tablet Generic drug: acetaminophen Take 500 mg by mouth as needed.        Signed: Shon Millet II 07/22/2018, 2:31 PM

## 2018-07-25 DIAGNOSIS — Z3A28 28 weeks gestation of pregnancy: Secondary | ICD-10-CM | POA: Diagnosis not present

## 2018-07-25 DIAGNOSIS — O26873 Cervical shortening, third trimester: Secondary | ICD-10-CM | POA: Diagnosis not present

## 2018-08-01 DIAGNOSIS — N760 Acute vaginitis: Secondary | ICD-10-CM | POA: Diagnosis not present

## 2018-08-06 ENCOUNTER — Encounter (HOSPITAL_COMMUNITY): Payer: Self-pay

## 2018-08-06 ENCOUNTER — Inpatient Hospital Stay (HOSPITAL_COMMUNITY)
Admission: AD | Admit: 2018-08-06 | Discharge: 2018-08-11 | DRG: 788 | Disposition: A | Discharge status: Home / Self Care | Payer: BC Managed Care – PPO | Attending: Obstetrics & Gynecology | Admitting: Obstetrics & Gynecology

## 2018-08-06 ENCOUNTER — Other Ambulatory Visit: Payer: Self-pay

## 2018-08-06 DIAGNOSIS — O26873 Cervical shortening, third trimester: Secondary | ICD-10-CM | POA: Diagnosis not present

## 2018-08-06 DIAGNOSIS — D259 Leiomyoma of uterus, unspecified: Secondary | ICD-10-CM | POA: Diagnosis not present

## 2018-08-06 DIAGNOSIS — O99214 Obesity complicating childbirth: Secondary | ICD-10-CM | POA: Diagnosis not present

## 2018-08-06 DIAGNOSIS — O321XX2 Maternal care for breech presentation, fetus 2: Secondary | ICD-10-CM | POA: Diagnosis not present

## 2018-08-06 DIAGNOSIS — O411230 Chorioamnionitis, third trimester, not applicable or unspecified: Secondary | ICD-10-CM | POA: Diagnosis not present

## 2018-08-06 DIAGNOSIS — O3413 Maternal care for benign tumor of corpus uteri, third trimester: Secondary | ICD-10-CM | POA: Diagnosis present

## 2018-08-06 DIAGNOSIS — O30043 Twin pregnancy, dichorionic/diamniotic, third trimester: Secondary | ICD-10-CM

## 2018-08-06 DIAGNOSIS — O42913 Preterm premature rupture of membranes, unspecified as to length of time between rupture and onset of labor, third trimester: Secondary | ICD-10-CM | POA: Diagnosis not present

## 2018-08-06 DIAGNOSIS — O9902 Anemia complicating childbirth: Secondary | ICD-10-CM | POA: Diagnosis present

## 2018-08-06 DIAGNOSIS — Z98891 History of uterine scar from previous surgery: Secondary | ICD-10-CM

## 2018-08-06 DIAGNOSIS — Z1159 Encounter for screening for other viral diseases: Secondary | ICD-10-CM | POA: Diagnosis not present

## 2018-08-06 DIAGNOSIS — O30013 Twin pregnancy, monochorionic/monoamniotic, third trimester: Secondary | ICD-10-CM | POA: Diagnosis not present

## 2018-08-06 DIAGNOSIS — D649 Anemia, unspecified: Secondary | ICD-10-CM | POA: Diagnosis not present

## 2018-08-06 DIAGNOSIS — O42919 Preterm premature rupture of membranes, unspecified as to length of time between rupture and onset of labor, unspecified trimester: Secondary | ICD-10-CM | POA: Diagnosis present

## 2018-08-06 DIAGNOSIS — Z3A30 30 weeks gestation of pregnancy: Secondary | ICD-10-CM | POA: Diagnosis not present

## 2018-08-06 DIAGNOSIS — O99213 Obesity complicating pregnancy, third trimester: Secondary | ICD-10-CM | POA: Diagnosis not present

## 2018-08-06 DIAGNOSIS — Z362 Encounter for other antenatal screening follow-up: Secondary | ICD-10-CM | POA: Diagnosis not present

## 2018-08-06 MED ORDER — MAGNESIUM SULFATE 40 G IN LACTATED RINGERS - SIMPLE
2.0000 g/h | Freq: Once | INTRAVENOUS | Status: AC
  Administered 2018-08-07: 01:00:00 2 g/h via INTRAVENOUS
  Filled 2018-08-06: qty 500

## 2018-08-06 MED ORDER — MAGNESIUM SULFATE 4 GM/100ML IV SOLN
4.0000 g | Freq: Once | INTRAVENOUS | Status: AC
  Administered 2018-08-07: 4 g via INTRAVENOUS
  Filled 2018-08-06: qty 100

## 2018-08-06 NOTE — MAU Provider Note (Signed)
History     CSN: [id number]  Arrival date and time: 08/06/18 2310   First Provider Initiated Contact with Patient 08/06/18 2341      No chief complaint on file.  Sai Moura is a 28 y.o. G2P0010 at [redacted]w[redacted]d with twins who presents today with ROM. She states that around 1045 she had a large gush of clear fluid. She has had cervical shortening, and was admitted at 26 weeks for Mag and BMZ. She was dilated 3cm at that time. She denies any contractions. She has had spotting off and on and reports that she lost her mucous plug when she was last in the hospital. She reports normal fetal movement. Patient has a hx of fibroids and PCOS.    OB History    Gravida  2   Para      Term      Preterm      AB  1   Living        SAB  1   TAB      Ectopic      Multiple      Live Births              Past Medical History:  Diagnosis Date  . Abdominal pain   . Cough   . Fibroid   . Generalized headaches   . Nasal congestion   . Nausea   . PCOS (polycystic ovarian syndrome)   . Sore throat     Past Surgical History:  Procedure Laterality Date  . WISDOM TOOTH EXTRACTION  2009 approximate    Family History  Problem Relation Age of Onset  . Heart disease Father   . Hypertension Father   . Thyroid disease Father   . Cancer Maternal Grandfather        lung    Social History   Tobacco Use  . Smoking status: Never Smoker  . Smokeless tobacco: Never Used  Substance Use Topics  . Alcohol use: No  . Drug use: No    Allergies: No Known Allergies  Medications Prior to Admission  Medication Sig Dispense Refill Last Dose  . butalbital-acetaminophen-caffeine (FIORICET) 50-325-40 MG tablet Take by mouth 2 (two) times daily as needed for headache (prn).     Marland Kitchen acetaminophen (TYLENOL) 500 MG tablet Take 500 mg by mouth as needed.     . folic acid (FOLVITE) 1 MG tablet Take 1 mg by mouth daily.     . prenatal vitamin w/FE, FA (PRENATAL 1 + 1) 27-1 MG TABS tablet Take 1  tablet by mouth daily at 12 noon.       Review of Systems  All other systems reviewed and are negative.  Physical Exam   Blood pressure (!) 145/74, pulse (!) 132, last menstrual period 12/07/2017, SpO2 99 %.  Physical Exam  Nursing note and vitals reviewed. Constitutional: She is oriented to person, place, and time. She appears well-developed and well-nourished. No distress.  HENT:  Head: Normocephalic.  Cardiovascular: Normal rate.  Respiratory: Effort normal.  GI: Soft. There is no abdominal tenderness. There is no rebound.  Neurological: She is alert and oriented to person, place, and time.  Skin: Skin is warm and dry.  Psychiatric: She has a normal mood and affect.   NST:  Baseline: 140 Variability: moderate Accels: 15x15 Decels: none Toco: none  NST:  Baseline: 130 Variability: moderate Accels: 15x15 Decels: none Toco: none  Pt informed that the ultrasound is considered a limited OB ultrasound and is  not intended to be a complete ultrasound exam.  Patient also informed that the ultrasound is not being completed with the intent of assessing for fetal or placental anomalies or any pelvic abnormalities.  Explained that the purpose of today's ultrasound is to assess for  presentation.  Patient acknowledges the purpose of the exam and the limitations of the study.    Baby A vertex Baby B breech  Results for orders placed or performed during the hospital encounter of 08/06/18 (from the past 24 hour(s))  POCT fern test     Status: Abnormal   Collection Time: 08/07/18 12:48 AM  Result Value Ref Range   POCT Fern Test Positive = ruptured amniotic membanes      MAU Course  Procedures  MDM 11:58 PM Dr. Lynnette Caffey notified. She will call NICU. 12:07 AM NICU ok with admission at this time. Will put in orders  Assessment and Plan   1. Preterm premature rupture of membranes (PPROM) with unknown onset of labor   2. [redacted] weeks gestation of pregnancy   3. Dichorionic  diamniotic twin pregnancy in third trimester    Admit to ante Latency abx Mag for neuro protection  Marcille Buffy DNP, CNM  08/07/18  12:50 AM

## 2018-08-07 ENCOUNTER — Other Ambulatory Visit: Payer: Self-pay

## 2018-08-07 ENCOUNTER — Encounter (HOSPITAL_COMMUNITY)
Admission: AD | Disposition: A | Discharge status: Home / Self Care | Payer: Self-pay | Source: Home / Self Care | Attending: Obstetrics & Gynecology

## 2018-08-07 ENCOUNTER — Encounter (HOSPITAL_COMMUNITY): Payer: Self-pay

## 2018-08-07 ENCOUNTER — Inpatient Hospital Stay (HOSPITAL_COMMUNITY): Payer: BC Managed Care – PPO

## 2018-08-07 DIAGNOSIS — O321XX2 Maternal care for breech presentation, fetus 2: Secondary | ICD-10-CM | POA: Diagnosis present

## 2018-08-07 DIAGNOSIS — O26873 Cervical shortening, third trimester: Secondary | ICD-10-CM

## 2018-08-07 DIAGNOSIS — Z362 Encounter for other antenatal screening follow-up: Secondary | ICD-10-CM

## 2018-08-07 DIAGNOSIS — O30043 Twin pregnancy, dichorionic/diamniotic, third trimester: Secondary | ICD-10-CM

## 2018-08-07 DIAGNOSIS — D649 Anemia, unspecified: Secondary | ICD-10-CM | POA: Diagnosis present

## 2018-08-07 DIAGNOSIS — O42913 Preterm premature rupture of membranes, unspecified as to length of time between rupture and onset of labor, third trimester: Secondary | ICD-10-CM

## 2018-08-07 DIAGNOSIS — O9902 Anemia complicating childbirth: Secondary | ICD-10-CM | POA: Diagnosis present

## 2018-08-07 DIAGNOSIS — O42919 Preterm premature rupture of membranes, unspecified as to length of time between rupture and onset of labor, unspecified trimester: Secondary | ICD-10-CM | POA: Diagnosis present

## 2018-08-07 DIAGNOSIS — Z1159 Encounter for screening for other viral diseases: Secondary | ICD-10-CM | POA: Diagnosis not present

## 2018-08-07 DIAGNOSIS — O99214 Obesity complicating childbirth: Secondary | ICD-10-CM | POA: Diagnosis present

## 2018-08-07 DIAGNOSIS — Z3A30 30 weeks gestation of pregnancy: Secondary | ICD-10-CM

## 2018-08-07 DIAGNOSIS — D259 Leiomyoma of uterus, unspecified: Secondary | ICD-10-CM | POA: Diagnosis present

## 2018-08-07 DIAGNOSIS — O99213 Obesity complicating pregnancy, third trimester: Secondary | ICD-10-CM | POA: Diagnosis not present

## 2018-08-07 DIAGNOSIS — O3413 Maternal care for benign tumor of corpus uteri, third trimester: Secondary | ICD-10-CM | POA: Diagnosis present

## 2018-08-07 LAB — URINALYSIS, ROUTINE W REFLEX MICROSCOPIC
Bilirubin Urine: NEGATIVE
Glucose, UA: NEGATIVE mg/dL
Ketones, ur: NEGATIVE mg/dL
Nitrite: NEGATIVE
Protein, ur: NEGATIVE mg/dL
Specific Gravity, Urine: 1.003 — ABNORMAL LOW (ref 1.005–1.030)
pH: 6 (ref 5.0–8.0)

## 2018-08-07 LAB — TYPE AND SCREEN
ABO/RH(D): A POS
Antibody Screen: NEGATIVE

## 2018-08-07 LAB — CBC
HCT: 33.1 % — ABNORMAL LOW (ref 36.0–46.0)
Hemoglobin: 11 g/dL — ABNORMAL LOW (ref 12.0–15.0)
MCH: 30.3 pg (ref 26.0–34.0)
MCHC: 33.2 g/dL (ref 30.0–36.0)
MCV: 91.2 fL (ref 80.0–100.0)
Platelets: 275 10*3/uL (ref 150–400)
RBC: 3.63 MIL/uL — ABNORMAL LOW (ref 3.87–5.11)
RDW: 13.2 % (ref 11.5–15.5)
WBC: 16 10*3/uL — ABNORMAL HIGH (ref 4.0–10.5)
nRBC: 0 % (ref 0.0–0.2)

## 2018-08-07 LAB — SARS CORONAVIRUS 2 BY RT PCR (HOSPITAL ORDER, PERFORMED IN ~~LOC~~ HOSPITAL LAB): SARS Coronavirus 2: NEGATIVE

## 2018-08-07 LAB — POCT FERN TEST: POCT Fern Test: POSITIVE

## 2018-08-07 SURGERY — Surgical Case
Anesthesia: Spinal

## 2018-08-07 MED ORDER — FOLIC ACID 1 MG PO TABS: 1.0000 mg | ORAL_TABLET | Freq: Every day | ORAL | Status: DC

## 2018-08-07 MED ORDER — ZOLPIDEM TARTRATE 5 MG PO TABS: 5.0000 mg | ORAL_TABLET | Freq: Every evening | ORAL | Status: DC | PRN

## 2018-08-07 MED ORDER — MAGNESIUM SULFATE 40 G IN LACTATED RINGERS - SIMPLE: 2.0000 g/h | INTRAVENOUS | Status: AC

## 2018-08-07 MED ORDER — AZITHROMYCIN 250 MG PO TABS: 500.0000 mg | ORAL_TABLET | Freq: Every day | ORAL | Status: DC

## 2018-08-07 MED ORDER — SOD CITRATE-CITRIC ACID 500-334 MG/5ML PO SOLN
ORAL | Status: AC
  Administered 2018-08-07: 15 mL
  Filled 2018-08-07: qty 30

## 2018-08-07 MED ORDER — FENTANYL CITRATE (PF) 100 MCG/2ML IJ SOLN
INTRAMUSCULAR | Status: AC
  Filled 2018-08-07: qty 2

## 2018-08-07 MED ORDER — DEXAMETHASONE SODIUM PHOSPHATE 10 MG/ML IJ SOLN
INTRAMUSCULAR | Status: AC
  Filled 2018-08-07: qty 1

## 2018-08-07 MED ORDER — DOCUSATE SODIUM 100 MG PO CAPS
100.0000 mg | ORAL_CAPSULE | Freq: Every day | ORAL | Status: DC
  Administered 2018-08-07: 100 mg via ORAL
  Filled 2018-08-07: qty 1

## 2018-08-07 MED ORDER — MORPHINE SULFATE (PF) 0.5 MG/ML IJ SOLN
INTRAMUSCULAR | Status: AC
  Filled 2018-08-07: qty 10

## 2018-08-07 MED ORDER — ACETAMINOPHEN 325 MG PO TABS: 650.0000 mg | ORAL_TABLET | ORAL | Status: DC | PRN

## 2018-08-07 MED ORDER — CALCIUM CARBONATE ANTACID 500 MG PO CHEW: 2.0000 | CHEWABLE_TABLET | ORAL | Status: DC | PRN

## 2018-08-07 MED ORDER — PRENATAL MULTIVITAMIN CH: 1.0000 | ORAL_TABLET | Freq: Every day | ORAL | Status: DC

## 2018-08-07 MED ORDER — PRENATAL MULTIVITAMIN CH
1.0000 | ORAL_TABLET | Freq: Every day | ORAL | Status: DC
  Administered 2018-08-07 (×2): 1 via ORAL
  Filled 2018-08-07 (×2): qty 1

## 2018-08-07 MED ORDER — ONDANSETRON HCL 4 MG/2ML IJ SOLN
INTRAMUSCULAR | Status: AC
  Filled 2018-08-07: qty 2

## 2018-08-07 MED ORDER — PHENYLEPHRINE HCL-NACL 20-0.9 MG/250ML-% IV SOLN
INTRAVENOUS | Status: AC
  Filled 2018-08-07: qty 250

## 2018-08-07 MED ORDER — BETAMETHASONE SOD PHOS & ACET 6 (3-3) MG/ML IJ SUSP
12.0000 mg | INTRAMUSCULAR | Status: DC
  Administered 2018-08-07: 12 mg via INTRAMUSCULAR
  Filled 2018-08-07 (×2): qty 2

## 2018-08-07 MED ORDER — OXYTOCIN 40 UNITS IN NORMAL SALINE INFUSION - SIMPLE MED
INTRAVENOUS | Status: AC
  Filled 2018-08-07: qty 1000

## 2018-08-07 MED ORDER — LACTATED RINGERS IV SOLN: INTRAVENOUS | Status: AC

## 2018-08-07 MED ORDER — BUTALBITAL-APAP-CAFFEINE 50-325-40 MG PO TABS
1.0000 | ORAL_TABLET | ORAL | Status: DC | PRN
  Administered 2018-08-07 (×2): 1 via ORAL
  Filled 2018-08-07 (×2): qty 1

## 2018-08-07 MED ORDER — SODIUM CHLORIDE 0.9 % IV SOLN
500.0000 mg | INTRAVENOUS | Status: DC
  Administered 2018-08-07: 500 mg via INTRAVENOUS
  Filled 2018-08-07: qty 500

## 2018-08-07 MED ORDER — SODIUM CHLORIDE 0.9 % IV SOLN
2.0000 g | Freq: Four times a day (QID) | INTRAVENOUS | Status: DC
  Administered 2018-08-07 (×4): 2 g via INTRAVENOUS
  Filled 2018-08-07 (×4): qty 2000

## 2018-08-07 MED ORDER — FOLIC ACID 1 MG PO TABS
1.0000 mg | ORAL_TABLET | Freq: Every day | ORAL | Status: DC
  Administered 2018-08-07 (×2): 1 mg via ORAL
  Filled 2018-08-07 (×2): qty 1

## 2018-08-07 SURGICAL SUPPLY — 39 items
BENZOIN TINCTURE PRP APPL 2/3 (GAUZE/BANDAGES/DRESSINGS) ×3 IMPLANT
CHLORAPREP W/TINT 26ML (MISCELLANEOUS) ×3 IMPLANT
CLAMP CORD UMBIL (MISCELLANEOUS) IMPLANT
CLOSURE STERI-STRIP 1/2X4 (GAUZE/BANDAGES/DRESSINGS) ×1
CLOSURE WOUND 1/2 X4 (GAUZE/BANDAGES/DRESSINGS)
CLOTH BEACON ORANGE TIMEOUT ST (SAFETY) ×3 IMPLANT
CLSR STERI-STRIP ANTIMIC 1/2X4 (GAUZE/BANDAGES/DRESSINGS) ×2 IMPLANT
DERMABOND ADVANCED (GAUZE/BANDAGES/DRESSINGS)
DERMABOND ADVANCED .7 DNX12 (GAUZE/BANDAGES/DRESSINGS) IMPLANT
DRSG OPSITE POSTOP 4X10 (GAUZE/BANDAGES/DRESSINGS) ×3 IMPLANT
ELECT REM PT RETURN 9FT ADLT (ELECTROSURGICAL) ×3
ELECTRODE REM PT RTRN 9FT ADLT (ELECTROSURGICAL) ×1 IMPLANT
EXTRACTOR VACUUM KIWI (MISCELLANEOUS) IMPLANT
GLOVE BIO SURGEON STRL SZ 6 (GLOVE) ×3 IMPLANT
GLOVE BIOGEL PI IND STRL 6 (GLOVE) ×2 IMPLANT
GLOVE BIOGEL PI IND STRL 7.0 (GLOVE) ×1 IMPLANT
GLOVE BIOGEL PI INDICATOR 6 (GLOVE) ×4
GLOVE BIOGEL PI INDICATOR 7.0 (GLOVE) ×2
GOWN STRL REUS W/TWL LRG LVL3 (GOWN DISPOSABLE) ×6 IMPLANT
HOVERMATT SINGLE USE (MISCELLANEOUS) ×3 IMPLANT
KIT ABG SYR 3ML LUER SLIP (SYRINGE) ×3 IMPLANT
NEEDLE HYPO 25X5/8 SAFETYGLIDE (NEEDLE) ×3 IMPLANT
NS IRRIG 1000ML POUR BTL (IV SOLUTION) ×3 IMPLANT
PACK C SECTION WH (CUSTOM PROCEDURE TRAY) ×3 IMPLANT
PAD ABD 8X10 STRL (GAUZE/BANDAGES/DRESSINGS) ×6 IMPLANT
PAD OB MATERNITY 4.3X12.25 (PERSONAL CARE ITEMS) ×3 IMPLANT
PENCIL SMOKE EVAC W/HOLSTER (ELECTROSURGICAL) ×3 IMPLANT
SPONGE GAUZE 4X4 12PLY STER LF (GAUZE/BANDAGES/DRESSINGS) ×6 IMPLANT
STRIP CLOSURE SKIN 1/2X4 (GAUZE/BANDAGES/DRESSINGS) IMPLANT
SUT CHROMIC 0 CTX 36 (SUTURE) ×9 IMPLANT
SUT MON AB 2-0 CT1 27 (SUTURE) ×3 IMPLANT
SUT PDS AB 0 CT1 27 (SUTURE) IMPLANT
SUT PLAIN 0 NONE (SUTURE) IMPLANT
SUT VIC AB 0 CT1 36 (SUTURE) IMPLANT
SUT VIC AB 4-0 KS 27 (SUTURE) IMPLANT
TAPE CLOTH SURG 6X10 WHT LF (GAUZE/BANDAGES/DRESSINGS) ×3 IMPLANT
TOWEL OR 17X24 6PK STRL BLUE (TOWEL DISPOSABLE) ×3 IMPLANT
TRAY FOLEY W/BAG SLVR 14FR LF (SET/KITS/TRAYS/PACK) IMPLANT
WATER STERILE IRR 1000ML POUR (IV SOLUTION) ×3 IMPLANT

## 2018-08-07 NOTE — Progress Notes (Signed)
Bedside u/s performed by Marcille Buffy CNM. Baby A ( on maternal left side) is vertex. Baby B (on maternal right side) is breech.   Gilmer Mor RN

## 2018-08-07 NOTE — Plan of Care (Signed)
  Problem: Education: Goal: Knowledge of disease or condition will improve Outcome: Progressing Goal: Knowledge of the prescribed therapeutic regimen will improve Outcome: Progressing Goal: Individualized Educational Video(s) Outcome: Progressing   Problem: Clinical Measurements: Goal: Complications related to the disease process, condition or treatment will be avoided or minimized Outcome: Progressing   Problem: Health Behavior/Discharge Planning: Goal: Ability to manage health-related needs will improve Outcome: Progressing   Problem: Activity: Goal: Risk for activity intolerance will decrease Outcome: Progressing   Problem: Nutrition: Goal: Adequate nutrition will be maintained Outcome: Progressing   Problem: Coping: Goal: Level of anxiety will decrease Outcome: Progressing   Problem: Elimination: Goal: Will not experience complications related to bowel motility Outcome: Progressing Goal: Will not experience complications related to urinary retention Outcome: Progressing   Problem: Pain Managment: Goal: General experience of comfort will improve Outcome: Progressing   Problem: Safety: Goal: Ability to remain free from injury will improve Outcome: Progressing   Problem: Skin Integrity: Goal: Risk for impaired skin integrity will decrease Outcome: Progressing

## 2018-08-07 NOTE — Anesthesia Preprocedure Evaluation (Addendum)
Anesthesia Evaluation  Patient identified by MRN, date of birth, ID band Patient awake    Reviewed: Allergy & Precautions, NPO status , Patient's Chart, lab work & pertinent test results  History of Anesthesia Complications Negative for: history of anesthetic complications  Airway Mallampati: II  TM Distance: >3 FB Neck ROM: Full    Dental no notable dental hx.    Pulmonary neg pulmonary ROS,    Pulmonary exam normal        Cardiovascular negative cardio ROS Normal cardiovascular exam     Neuro/Psych  Headaches,    GI/Hepatic negative GI ROS, Neg liver ROS,   Endo/Other  Morbid obesity (BMI 50)  Renal/GU negative Renal ROS     Musculoskeletal negative musculoskeletal ROS (+)   Abdominal   Peds  Hematology  (+) anemia , Hgb 11.0   Anesthesia Other Findings Day of surgery medications reviewed with the patient.  Reproductive/Obstetrics (+) Pregnancy (Twin gestation)                            Anesthesia Physical Anesthesia Plan  ASA: III  Anesthesia Plan: Spinal   Post-op Pain Management:    Induction:   PONV Risk Score and Plan: Treatment may vary due to age or medical condition, Ondansetron and Dexamethasone  Airway Management Planned: Natural Airway  Additional Equipment:   Intra-op Plan:   Post-operative Plan:   Informed Consent: I have reviewed the patients History and Physical, chart, labs and discussed the procedure including the risks, benefits and alternatives for the proposed anesthesia with the patient or authorized representative who has indicated his/her understanding and acceptance.       Plan Discussed with: CRNA  Anesthesia Plan Comments:        Anesthesia Quick Evaluation

## 2018-08-07 NOTE — H&P (Addendum)
Caitlyn Ramos is a 28 y.o. G2P0010 at [redacted]w[redacted]d with twins who presents today with ROM. She states that around 1045 last night (7/11) she had a large gush of clear fluid. She has had cervical shortening, and was admitted at 26 weeks for Mag and BMZ (6/19,20). She was dilated 3cm at that time. She has had spotting off and on and reports that she lost her mucous plug when she was last in the hospital. She reports rare, mild contractions. Active FM.  No VB. Patient has a hx of fibroids and PCOS.  Patient has frequent migraines and takes Fioricet prn.           OB History    Gravida  2   Para      Term      Preterm      AB  1   Living        SAB  1   TAB      Ectopic      Multiple      Live Births                  Past Medical History:  Diagnosis Date  . Abdominal pain   . Cough   . Fibroid   . Generalized headaches   . Nasal congestion   . Nausea   . PCOS (polycystic ovarian syndrome)   . Sore throat          Past Surgical History:  Procedure Laterality Date  . WISDOM TOOTH EXTRACTION  2009 approximate         Family History  Problem Relation Age of Onset  . Heart disease Father   . Hypertension Father   . Thyroid disease Father   . Cancer Maternal Grandfather        lung    Social History       Tobacco Use  . Smoking status: Never Smoker  . Smokeless tobacco: Never Used  Substance Use Topics  . Alcohol use: No  . Drug use: No     Prenatal Transfer Tool  Maternal Diabetes: No Genetic Screening: Normal Maternal Ultrasounds/Referrals: Normal Fetal Ultrasounds or other Referrals:  None Maternal Substance Abuse:  No Significant Maternal Medications:  None Significant Maternal Lab Results: None     Allergies: No Known Allergies         Medications Prior to Admission  Medication Sig Dispense Refill Last Dose  . butalbital-acetaminophen-caffeine (FIORICET) 50-325-40 MG tablet Take by mouth 2 (two) times daily as  needed for headache (prn).     Marland Kitchen acetaminophen (TYLENOL) 500 MG tablet Take 500 mg by mouth as needed.     . folic acid (FOLVITE) 1 MG tablet Take 1 mg by mouth daily.     . prenatal vitamin w/FE, FA (PRENATAL 1 + 1) 27-1 MG TABS tablet Take 1 tablet by mouth daily at 12 noon.       Review of Systems  All other systems reviewed and are negative.  Physical Exam   Blood pressure (!) 145/74, pulse (!) 132, last menstrual period 12/07/2017, SpO2 99 %.  Physical Exam  Nursing note and vitals reviewed. Constitutional: She is oriented to person, place, and time. She appears well-developed and well-nourished. No distress.  HENT:  Head: Normocephalic.  Cardiovascular: Normal rate.  Respiratory: Effort normal.  GI: Soft. There is no abdominal tenderness. There is no rebound.  Neurological: She is alert and oriented to person, place, and time.  Skin: Skin is warm and  dry.  Psychiatric: She has a normal mood and affect.   NST:  Baseline: 140 Variability: moderate Accels: 15x15 Decels: none Toco: none  NST:  Baseline: 130 Variability: moderate Accels: 15x15 Decels: none Toco: none  Pt informed that the ultrasound is considered a limited OB ultrasound and is not intended to be a complete ultrasound exam.  Patient also informed that the ultrasound is not being completed with the intent of assessing for fetal or placental anomalies or any pelvic abnormalities.  Explained that the purpose of today's ultrasound is to assess for  presentation.  Patient acknowledges the purpose of the exam and the limitations of the study.    Baby A vertex Baby B breech  Lab Results Last 24 Hours       Results for orders placed or performed during the hospital encounter of 08/06/18 (from the past 24 hour(s))  POCT fern test     Status: Abnormal   Collection Time: 08/07/18 12:48 AM  Result Value Ref Range   POCT Fern Test Positive = ruptured amniotic membanes       Assessment  and Plan   1. Preterm premature rupture of membranes (PPROM) with unknown onset of labor   2. [redacted] weeks gestation of pregnancy   3. Dichorionic diamniotic twin pregnancy in third trimester    -GBS pending -Mag for NP -Latency abx -Rescue course BMZ -NICU consult tomorrow per pt request.  Patient informed that if delivery is imminent, given full NICU census, there is the possibility that she may need to be transferred for delivery. -MFM u/s today for EFW -Patient desires vaginal delivery. Will review u/s report and discuss findings with patient to counsel regarding options. -Monitor for signs of labor, infection, abruption.  Patient counseled re: these possibilities and understands need to deliver should they occur.   -CEFM, Toco

## 2018-08-07 NOTE — Progress Notes (Signed)
Patient began having CTX around 10am q 5-6 minutes.  RN checked cervix and it was unchanged at 3 cm.   She has been on CEFM since then with flat toco and Cat I monitoring x 2.  Patient states CTX have spaced to q 10-12 minutes.  U/S report pending from today but on review of images, vtx/breech with Twin A 1476g and Twin B 1727g; 14.49% discordance.   Patient counseled that should she labor at this time, we would need to confirm NICUs ability to accept twins.  Also, I recommend primary C/S given smaller Twin A at less than 1500g EFW.  Patient agrees.    Linda Hedges, DO

## 2018-08-07 NOTE — Progress Notes (Signed)
Difficult to trace FHT's because of maternal habitus, fetal position, and FHR baseline rates very simillar. Patient is now sitting up eating; will continue to readjust ultrasounds to better trace FHT'[s.

## 2018-08-07 NOTE — Plan of Care (Signed)
  Problem: Education: Goal: Knowledge of disease or condition will improve Outcome: Progressing   Problem: Safety: Goal: Ability to remain free from injury will improve Outcome: Progressing

## 2018-08-08 ENCOUNTER — Encounter (HOSPITAL_COMMUNITY): Payer: Self-pay

## 2018-08-08 ENCOUNTER — Inpatient Hospital Stay (HOSPITAL_COMMUNITY): Payer: BC Managed Care – PPO | Admitting: Anesthesiology

## 2018-08-08 DIAGNOSIS — Z98891 History of uterine scar from previous surgery: Secondary | ICD-10-CM

## 2018-08-08 LAB — CBC
HCT: 30.7 % — ABNORMAL LOW (ref 36.0–46.0)
Hemoglobin: 10.3 g/dL — ABNORMAL LOW (ref 12.0–15.0)
MCH: 30.7 pg (ref 26.0–34.0)
MCHC: 33.6 g/dL (ref 30.0–36.0)
MCV: 91.4 fL (ref 80.0–100.0)
Platelets: 272 10*3/uL (ref 150–400)
RBC: 3.36 MIL/uL — ABNORMAL LOW (ref 3.87–5.11)
RDW: 13.2 % (ref 11.5–15.5)
WBC: 26.5 10*3/uL — ABNORMAL HIGH (ref 4.0–10.5)
nRBC: 0 % (ref 0.0–0.2)

## 2018-08-08 MED ORDER — CHLOROPROCAINE HCL (PF) 3 % IJ SOLN
INTRAMUSCULAR | Status: DC | PRN
  Administered 2018-08-08: 20 mL

## 2018-08-08 MED ORDER — SENNOSIDES-DOCUSATE SODIUM 8.6-50 MG PO TABS
2.0000 | ORAL_TABLET | ORAL | Status: DC
  Administered 2018-08-09 – 2018-08-10 (×3): 2 via ORAL
  Filled 2018-08-08 (×3): qty 2

## 2018-08-08 MED ORDER — ACETAMINOPHEN 500 MG PO TABS: 1000.0000 mg | ORAL_TABLET | Freq: Once | ORAL | Status: DC

## 2018-08-08 MED ORDER — MORPHINE SULFATE (PF) 0.5 MG/ML IJ SOLN
INTRAMUSCULAR | Status: DC | PRN
  Administered 2018-08-08: .15 mg via INTRATHECAL

## 2018-08-08 MED ORDER — SIMETHICONE 80 MG PO CHEW
80.0000 mg | CHEWABLE_TABLET | Freq: Three times a day (TID) | ORAL | Status: DC
  Administered 2018-08-08 – 2018-08-11 (×9): 80 mg via ORAL
  Filled 2018-08-08 (×10): qty 1

## 2018-08-08 MED ORDER — SODIUM CHLORIDE 0.9 % IR SOLN
Status: DC | PRN
  Administered 2018-08-08: 1000 mL

## 2018-08-08 MED ORDER — KETOROLAC TROMETHAMINE 30 MG/ML IJ SOLN
30.0000 mg | Freq: Once | INTRAMUSCULAR | Status: AC
  Administered 2018-08-08: 30 mg via INTRAVENOUS

## 2018-08-08 MED ORDER — ONDANSETRON HCL 4 MG/2ML IJ SOLN
INTRAMUSCULAR | Status: DC | PRN
  Administered 2018-08-08: 4 mg via INTRAVENOUS

## 2018-08-08 MED ORDER — ACETAMINOPHEN 500 MG PO TABS
1000.0000 mg | ORAL_TABLET | Freq: Four times a day (QID) | ORAL | Status: DC
  Administered 2018-08-08 – 2018-08-11 (×10): 1000 mg via ORAL
  Filled 2018-08-08 (×13): qty 2

## 2018-08-08 MED ORDER — CHLOROPROCAINE HCL (PF) 3 % IJ SOLN
INTRAMUSCULAR | Status: AC
  Filled 2018-08-08: qty 20

## 2018-08-08 MED ORDER — MENTHOL 3 MG MT LOZG: 1.0000 | LOZENGE | OROMUCOSAL | Status: DC | PRN

## 2018-08-08 MED ORDER — SIMETHICONE 80 MG PO CHEW
80.0000 mg | CHEWABLE_TABLET | ORAL | Status: DC | PRN
  Administered 2018-08-09 – 2018-08-10 (×3): 80 mg via ORAL
  Filled 2018-08-08: qty 1

## 2018-08-08 MED ORDER — LACTATED RINGERS IV SOLN
INTRAVENOUS | Status: DC | PRN
  Administered 2018-08-08 (×2): via INTRAVENOUS

## 2018-08-08 MED ORDER — DEXTROSE 5 % IV SOLN
3.0000 g | Freq: Once | INTRAVENOUS | Status: AC
  Administered 2018-08-08: 3 g via INTRAVENOUS
  Filled 2018-08-08: qty 3000

## 2018-08-08 MED ORDER — PHENYLEPHRINE HCL-NACL 20-0.9 MG/250ML-% IV SOLN
INTRAVENOUS | Status: DC | PRN
  Administered 2018-08-08: 60 ug/min via INTRAVENOUS

## 2018-08-08 MED ORDER — PROMETHAZINE HCL 25 MG/ML IJ SOLN: 6.2500 mg | INTRAMUSCULAR | Status: DC | PRN

## 2018-08-08 MED ORDER — SODIUM CHLORIDE 0.9 % IR SOLN
Status: DC | PRN
  Administered 2018-08-08: 400 mL

## 2018-08-08 MED ORDER — ACETAMINOPHEN 160 MG/5ML PO SOLN: 1000.0000 mg | Freq: Once | ORAL | Status: DC

## 2018-08-08 MED ORDER — DIPHENHYDRAMINE HCL 25 MG PO CAPS
25.0000 mg | ORAL_CAPSULE | Freq: Four times a day (QID) | ORAL | Status: DC | PRN
  Administered 2018-08-08: 25 mg via ORAL
  Filled 2018-08-08: qty 1

## 2018-08-08 MED ORDER — ZOLPIDEM TARTRATE 5 MG PO TABS: 5.0000 mg | ORAL_TABLET | Freq: Every evening | ORAL | Status: DC | PRN

## 2018-08-08 MED ORDER — OXYCODONE-ACETAMINOPHEN 5-325 MG PO TABS
1.0000 | ORAL_TABLET | ORAL | Status: DC | PRN
  Administered 2018-08-08 – 2018-08-09 (×2): 2 via ORAL
  Filled 2018-08-08 (×2): qty 2

## 2018-08-08 MED ORDER — DEXAMETHASONE SODIUM PHOSPHATE 10 MG/ML IJ SOLN
INTRAMUSCULAR | Status: DC | PRN
  Administered 2018-08-08: 10 mg via INTRAVENOUS

## 2018-08-08 MED ORDER — COCONUT OIL OIL
1.0000 "application " | TOPICAL_OIL | Status: DC | PRN
  Administered 2018-08-09: 1 via TOPICAL

## 2018-08-08 MED ORDER — TETANUS-DIPHTH-ACELL PERTUSSIS 5-2.5-18.5 LF-MCG/0.5 IM SUSP: 0.5000 mL | Freq: Once | INTRAMUSCULAR | Status: DC

## 2018-08-08 MED ORDER — DEXTROSE 5 % IV SOLN
INTRAVENOUS | Status: AC
  Filled 2018-08-08: qty 3000

## 2018-08-08 MED ORDER — SODIUM CHLORIDE 0.9 % IV SOLN
INTRAVENOUS | Status: DC | PRN
  Administered 2018-08-08: 01:00:00 via INTRAVENOUS

## 2018-08-08 MED ORDER — IBUPROFEN 800 MG PO TABS
800.0000 mg | ORAL_TABLET | Freq: Four times a day (QID) | ORAL | Status: DC | PRN
  Administered 2018-08-09: 800 mg via ORAL
  Filled 2018-08-08: qty 1

## 2018-08-08 MED ORDER — WITCH HAZEL-GLYCERIN EX PADS: 1.0000 "application " | MEDICATED_PAD | CUTANEOUS | Status: DC | PRN

## 2018-08-08 MED ORDER — BUPIVACAINE IN DEXTROSE 0.75-8.25 % IT SOLN
INTRATHECAL | Status: DC | PRN
  Administered 2018-08-08: 1.7 mL via INTRATHECAL

## 2018-08-08 MED ORDER — OXYTOCIN 40 UNITS IN NORMAL SALINE INFUSION - SIMPLE MED: 2.5000 [IU]/h | INTRAVENOUS | Status: AC

## 2018-08-08 MED ORDER — LACTATED RINGERS IV SOLN: INTRAVENOUS | Status: DC

## 2018-08-08 MED ORDER — SODIUM CHLORIDE 0.9 % IV SOLN
INTRAVENOUS | Status: DC | PRN
  Administered 2018-08-08: 01:00:00 40 [IU] via INTRAVENOUS

## 2018-08-08 MED ORDER — FENTANYL CITRATE (PF) 100 MCG/2ML IJ SOLN: 25.0000 ug | INTRAMUSCULAR | Status: DC | PRN

## 2018-08-08 MED ORDER — KETOROLAC TROMETHAMINE 30 MG/ML IJ SOLN
INTRAMUSCULAR | Status: AC
  Filled 2018-08-08: qty 1

## 2018-08-08 MED ORDER — SIMETHICONE 80 MG PO CHEW
80.0000 mg | CHEWABLE_TABLET | ORAL | Status: DC
  Administered 2018-08-09 – 2018-08-10 (×2): 80 mg via ORAL
  Filled 2018-08-08 (×3): qty 1

## 2018-08-08 MED ORDER — FENTANYL CITRATE (PF) 100 MCG/2ML IJ SOLN
INTRAMUSCULAR | Status: DC | PRN
  Administered 2018-08-08: 35 ug via INTRAVENOUS
  Administered 2018-08-08: 15 ug via INTRATHECAL

## 2018-08-08 MED ORDER — DIBUCAINE (PERIANAL) 1 % EX OINT: 1.0000 "application " | TOPICAL_OINTMENT | CUTANEOUS | Status: DC | PRN

## 2018-08-08 MED ORDER — PRENATAL MULTIVITAMIN CH
1.0000 | ORAL_TABLET | Freq: Every day | ORAL | Status: DC
  Administered 2018-08-08 – 2018-08-10 (×3): 1 via ORAL
  Filled 2018-08-08 (×4): qty 1

## 2018-08-08 NOTE — Lactation Note (Signed)
This note was copied from a baby's chart. Lactation Consultation Note  Patient Name: Caitlyn Ramos [id number] Date: 08/08/2018 Reason for consult: Initial assessment;Preterm <34wks;Infant < 6lbs;1st time breastfeeding;Primapara;Multiple gestation;NICU baby  P2 mother whose infant twin boys are now 33 hours old.  These are preterm infants at 30+3 weeks weighing < 6 lbs and in the NICU.  Mother had just finished pumping when I arrived.  Reviewed pump, pump parts and settings.  Observed her pumping ( mother desired to review)  and taught hand expression.  Mother was able to return demonstrate and a few drops of colostrum were expressed from each breast.  Colostrum containers provided and milk storage times reviewed.  NICU will print labels and parents are aware that all containers must be labeled.   Encouraged mother to pump every 2 1/2-3 hours to help increase milk supply.  Suggested hand expression before/after pumping to facilitate milk supply.  Mother will use EBM to rub into nipples/areolas for comfort.  Mother's breasts are soft and non tender and nipples are short shafted and everted.  Reviewed pumping in the NICU and how to bring pump parts with her should she desire to do this.    Mom made aware of O/P services, breastfeeding support groups, community resources, and our phone # for post-discharge questions. Booklet "Providing Breast Milk For Your Baby in the NICU" provided along with information on virtual breast feeding support group.  Informed mother that we will continue to assist with breast feeding when her babies are able to begin latching in the NICU.  Encouraged lots of STS contact with both parents until that time.    Mother will be obtaining a DEBP from her insurance company.  I suggested she call today to inform them to ship as soon as possible.  Mother will use her neighbor's pump if needed after discharge if her pump does not arrive in time.  She will use her Medela pump parts with  this pump.    Mother had many questions which I answered to her satisfaction.  Parents are receptive to learning.  Mother's feeding goal is to exclusively breast feed her twins.  Father present and supportive.   Maternal Data Formula Feeding for Exclusion: No Has patient been taught Hand Expression?: Yes Does the patient have breastfeeding experience prior to this delivery?: No  Feeding    LATCH Score                   Interventions    Lactation Tools Discussed/Used WIC Program: No Pump Review: Setup, frequency, and cleaning;Milk Storage(Reviewed) Initiated by:: Paul Dykes Date initiated:: 08/08/18   Consult Status Consult Status: Follow-up Date: 08/09/18 Follow-up type: In-patient    Riccardo Holeman R Mccabe Gloria 08/08/2018, 9:26 AM

## 2018-08-08 NOTE — Plan of Care (Signed)
  Problem: Skin Integrity: Goal: Demonstration of wound healing without infection will improve Outcome: Progressing   

## 2018-08-08 NOTE — Progress Notes (Signed)
Subjective: Postpartum Day 0: Cesarean Delivery Patient reports tolerating PO.    Objective: Vital signs in last 24 hours: Temp:  [97.5 F (36.4 C)-98.1 F (36.7 C)] 98.1 F (36.7 C) (07/13 0801) Pulse Rate:  [47-98] 61 (07/13 0801) Resp:  [14-31] 18 (07/13 0801) BP: (87-134)/(42-67) 105/43 (07/13 0801) SpO2:  [93 %-100 %] 97 % (07/13 0801)  Physical Exam:  General: alert, cooperative, appears older than stated age and no distress Lochia: appropriate Uterine Fundus: firm Incision: healing well DVT Evaluation: No evidence of DVT seen on physical exam.  Recent Labs    08/07/18 0120 08/08/18 0607  HGB 11.0* 10.3*  HCT 33.1* 30.7*    Assessment/Plan: Status post Cesarean section. Doing well postoperatively.  Continue current care Babies in NICU on CPAP.  Caitlyn Ramos 08/08/2018, 9:15 AM

## 2018-08-08 NOTE — Transfer of Care (Signed)
Immediate Anesthesia Transfer of Care Note  Patient: Caitlyn Ramos  Procedure(s) Performed: CESAREAN SECTION (N/A )  Patient Location: PACU  Anesthesia Type:Spinal  Level of Consciousness: awake, alert  and oriented  Airway & Oxygen Therapy: Patient Spontanous Breathing  Post-op Assessment: Report given to RN and Post -op Vital signs reviewed and stable  Post vital signs: Reviewed and stable  Last Vitals:  Vitals Value Taken Time  BP    Temp    Pulse    Resp    SpO2      Last Pain:  Vitals:   08/07/18 2300  TempSrc:   PainSc: 6       Patients Stated Pain Goal: 3 (02/33/43 5686)  Complications: No apparent anesthesia complications

## 2018-08-08 NOTE — Anesthesia Procedure Notes (Signed)
Spinal  Patient location during procedure: OR Start time: 08/08/2018 12:16 AM End time: 08/08/2018 12:19 AM Staffing Anesthesiologist: Brennan Bailey, MD Performed: anesthesiologist  Preanesthetic Checklist Completed: patient identified, site marked, pre-op evaluation, timeout performed, IV checked, risks and benefits discussed and monitors and equipment checked Spinal Block Patient position: sitting Prep: ChloraPrep Patient monitoring: heart rate, cardiac monitor and continuous pulse ox Approach: midline Location: L3-4 Injection technique: single-shot Needle Needle type: Pencan  Needle gauge: 24 G Needle length: 10 cm Additional Notes Risks, benefits, and alternative discussed. Patient gave consent to procedure. Prepped and draped in sitting position. Clear CSF obtained after two needle redirections. Positive terminal aspiration. No pain or paraesthesias with injection. Patient tolerated procedure well. Vital signs stable. Tawny Asal, MD  Note: 10cm Pencan needle maximally depressed into subcutaneous tissue before accessing intrathecal space, in future would consider longer needle.

## 2018-08-08 NOTE — Op Note (Signed)
Caitlyn Ramos PROCEDURE DATE: 08/06/2018 - 08/08/2018  PREOPERATIVE DIAGNOSIS: Intrauterine pregnancy at  [redacted]w[redacted]d weeks gestation, Di/Di twins with PPROM, labor  POSTOPERATIVE DIAGNOSIS: The same  PROCEDURE: Primary Low Transverse Cesarean Section  SURGEON:  Dr. Linda Hedges  INDICATIONS: [name] is a 28 y.o. G2P0010 at [redacted]w[redacted]d scheduled for cesarean section secondary to PPROM and labor with di/di twins.  The risks of cesarean section discussed with the patient included but were not limited to: bleeding which may require transfusion or reoperation; infection which may require antibiotics; injury to bowel, bladder, ureters or other surrounding organs; injury to the fetus; need for additional procedures including hysterectomy in the event of a life-threatening hemorrhage; placental abnormalities wth subsequent pregnancies, incisional problems, thromboembolic phenomenon and other postoperative/anesthesia complications. The patient concurred with the proposed plan, giving informed written consent for the procedure.    FINDINGS:  Twin A-Viable female infant in cephalic presentation, APGARs and weight pending, clear amniotic fluid.  Twin B-Viable female infant in frank breech presentation, APGARs and weight pending, clear amniotic fluid.  Intact placenta, three vessel cord.  Left fundal fibroid measuring approximately 10 cm with omental adhesions.  Grossly normal ovaries and fallopian tubes. .   ANESTHESIA: Spinal ESTIMATED BLOOD LOSS: 786 mL ml SPECIMENS: Placenta sent to pathology COMPLICATIONS: None immediate  PROCEDURE IN DETAIL:  The patient received intravenous antibiotics and had sequential compression devices applied to her lower extremities while in the preoperative area.  She was then taken to the operating room where spinal anesthesia was administered and was found to be adequate. She was then placed in a dorsal supine position with a leftward tilt, and prepped and draped in a sterile manner.  A  foley catheter was placed into her bladder and attached to constant gravity.  After an adequate timeout was performed, a Pfannenstiel skin incision was made with scalpel and carried through to the underlying layer of fascia. The fascia was incised in the midline and this incision was extended bilaterally using the Mayo scissors. Kocher clamps were applied to the superior aspect of the fascial incision and the underlying rectus muscles were dissected off bluntly. A similar process was carried out on the inferior aspect of the facial incision. The rectus muscles were separated in the midline bluntly and the peritoneum was entered bluntly. Bladder flap was created sharply and developed bluntly.  Bladder blade was placed.  A transverse hysterotomy was made with a scalpel and extended bilaterally bluntly. The bladder blade was then removed. Twin A was successfully delivered, and cord was clamped and cut and infant was handed over to awaiting neonatology team. Twin B was then delivered using typical breech maneuvers.  The cord was clamped, cut and the infant handed over to awaiting neonatology team.  Uterine massage was then administered and the placentae delivered intact with three-vessel cord x 2. The uterus was cleared of clot and debris.  The hysterotomy was closed with 0 chromic.  A second imbricating suture of 0-chromic was used to reinforce the incision and aid in hemostasis.  The peritoneum and rectus muscles were noted to be hemostatic and were reapproximated using 3-0 monocryl in a running fashion.  The fascia was closed with 0-Vicryl in a running fashion with good restoration of anatomy.  The subcutaneus tissue was copiously irrigated and reapproximated using three interrupted plain gut sutures.  The skin was closed with 4-0 vicryl in a subcuticular fahsion.  Pt tolerated the procedure will.  All counts were correct x2.  Pt went to the  recovery room in stable condition.

## 2018-08-08 NOTE — Anesthesia Postprocedure Evaluation (Signed)
Anesthesia Post Note  Patient: Caitlyn Ramos  Procedure(s) Performed: CESAREAN SECTION (N/A )     Patient location during evaluation: PACU Anesthesia Type: Spinal Level of consciousness: awake and alert Pain management: pain level controlled Vital Signs Assessment: post-procedure vital signs reviewed and stable Respiratory status: spontaneous breathing, nonlabored ventilation and respiratory function stable Cardiovascular status: blood pressure returned to baseline and stable Postop Assessment: no apparent nausea or vomiting and spinal receding Anesthetic complications: no    Last Vitals:  Vitals:   08/08/18 0245 08/08/18 0407  BP: (!) 124/59 (!) 123/49  Pulse: 86 84  Resp: (!) 21 19  Temp: 36.7 C 36.7 C  SpO2: 99% 98%    Last Pain:  Vitals:   08/08/18 0407  TempSrc: Oral  PainSc:    Pain Goal: Patients Stated Pain Goal: 3 (08/07/18 2005)                 Brennan Bailey

## 2018-08-08 NOTE — Progress Notes (Addendum)
Patient reported to RN more frequent and intense CTX.  CVX 5 cm (changed from 3 cm).  Given PPROM with di/di twins, Twin B breech and twin A <1500g with labor, I recommend primary C/S.  Patient had food at 130 PM and milk at 8 pm.  She is counseled re: risk of bleeding, infection, scarring, and damage to surrounding structures.  She is counseled re: 1% risk of uterine rupture in subsequent pregnancies.  All questions were answered and patient wishes to proceed.  I called NICU and they are able to accept two new admissions at this time.  Linda Hedges, DO

## 2018-08-09 LAB — CULTURE, BETA STREP (GROUP B ONLY)

## 2018-08-09 MED ORDER — OXYCODONE HCL 5 MG PO TABS
5.0000 mg | ORAL_TABLET | ORAL | Status: DC | PRN
  Administered 2018-08-09 – 2018-08-10 (×6): 5 mg via ORAL
  Filled 2018-08-09 (×6): qty 1

## 2018-08-09 MED ORDER — SODIUM CHLORIDE 0.9% FLUSH
3.0000 mL | Freq: Two times a day (BID) | INTRAVENOUS | Status: DC
  Administered 2018-08-09 – 2018-08-10 (×3): 3 mL via INTRAVENOUS

## 2018-08-09 MED ORDER — SODIUM CHLORIDE 0.9% FLUSH: 3.0000 mL | INTRAVENOUS | Status: DC | PRN

## 2018-08-09 NOTE — Progress Notes (Signed)
Patient doing well. Babies doing well in NICU.  BP (!) 132/98 (BP Location: Right Arm) Comment: notified nurse  Pulse (!) 117   Temp 98 F (36.7 C) (Oral)   Resp 18   Ht 5\' 7"  (1.702 m)   Wt (!) 146.5 kg   LMP 12/07/2017 (Exact Date)   SpO2 100%   Breastfeeding Unknown   BMI 50.59 kg/m  No results found for this or any previous visit (from the past 24 hour(s)). Abdomen soft and non tender  POD # 1  Doing well Routine care

## 2018-08-09 NOTE — Lactation Note (Signed)
This note was copied from a baby's chart. Lactation Consultation Note  Patient Name: Oralee Rapaport [id number] Date: 08/09/2018 Reason for consult: Follow-up assessment;1st time breastfeeding;NICU baby;Preterm <34wks  1213 - 1221 - I followed up with Ms. Daggs to check on her progress with breast pumping. She states that she is pumping every 2-3 hours and she is currently "pumping air." Mom and her support person acknowledge that it will take a few days for her milk volume to begin to increase.  Ms. Banner is also doing some hand expression and sending colostrum to the NICU. She is able to get a bit more colostrum out this way.  I educated on the benefits of colostrum and the differences between colostrum and mature milk in terms of milk volume and milk composition. We also discussed the use of donor milk, and I provided the patient with some education on the pasturization and testing process.  Ms. Oliger is experiencing discomfort due to the C/S, but otherwise seemed in good spirits. She is excited about her two new babies and hopeful that she will be able to provide them with her breast milk. I provided encouragement as to how she is doing and to continue with this current plan. I suggested that lactation could follow up again tomorrow.   Interventions Interventions: Breast feeding basics reviewed(education on pumping, milk volume, benefits of milk)  Lactation Tools Discussed/Used WIC Program: No Pump Review: Setup, frequency, and cleaning;Milk Storage   Consult Status Consult Status: Follow-up Date: 08/10/18 Follow-up type: In-patient    Lenore Manner 08/09/2018, 12:33 PM

## 2018-08-10 MED ORDER — IBUPROFEN 800 MG PO TABS
800.0000 mg | ORAL_TABLET | Freq: Four times a day (QID) | ORAL | Status: DC
  Administered 2018-08-10 – 2018-08-11 (×4): 800 mg via ORAL
  Filled 2018-08-10 (×4): qty 1

## 2018-08-10 MED ORDER — OXYCODONE HCL 5 MG PO TABS
10.0000 mg | ORAL_TABLET | ORAL | Status: DC | PRN
  Administered 2018-08-10 – 2018-08-11 (×4): 10 mg via ORAL
  Filled 2018-08-10 (×4): qty 2

## 2018-08-10 NOTE — Lactation Note (Signed)
This note was copied from a baby's chart. Lactation Consultation Note  Patient Name: Vyla Pint [id number] Date: 08/10/2018 Reason for consult: Follow-up assessment;Preterm <34wks;Primapara;1st time breastfeeding;NICU baby;Infant < 6lbs  P2 mother whose infant twin boys are now 20 hours old.  These are preterm babies at 30+3 weeks, weighing < 6 lbs and in the NICU.  Mother has been doing a great job of pumping consistently every 2 1/2-3 hours.  She is performing hand expression to help facilitate milk supply.  Mother has been able to recently obtain a few mls of colostrum with pumping and is very excited.  All EBM is being taken to the NICU.  She has no questions on pumping, storage, labeling or transporting colostrum.    Reminded mother that she is able to pump in the NICU and she voiced that she would like to begin doing this today.  She has been very diligent about continuing to establishing a good supply for her babies and hopes to exclusively breast feed in time.  Parents are receptive to learning and ask appropriate questions.  Mother is feeling much more comfortable today in her ability to provide colostrum.  Praised her efforts and asked her to call me for any further questions/concerns.  Mother verbalized understanding.  Mother has not yet called her insurance company about obtaining a DEBP as discussed on admission but will do this today.     Maternal Data Formula Feeding for Exclusion: No Has patient been taught Hand Expression?: No Does the patient have breastfeeding experience prior to this delivery?: No  Feeding Feeding Type: Donor Breast Milk  LATCH Score                   Interventions    Lactation Tools Discussed/Used WIC Program: No   Consult Status Consult Status: Follow-up Date: 08/11/18 Follow-up type: In-patient    Baani Bober R Byanca Kasper 08/10/2018, 8:55 AM

## 2018-08-10 NOTE — Progress Notes (Signed)
Subjective: Postpartum Day 2: Cesarean Delivery Patient reports tolerating PO, + flatus, + BM and no problems voiding.    Objective: Vital signs in last 24 hours: Temp:  [97.7 F (36.5 C)-98.3 F (36.8 C)] 98.3 F (36.8 C) (07/15 0756) Pulse Rate:  [83-103] 84 (07/15 0756) Resp:  [18] 18 (07/15 0756) BP: (96-125)/(27-70) 117/55 (07/15 0756) SpO2:  [98 %-100 %] 99 % (07/15 0756)  Physical Exam:  General: alert, cooperative and appears stated age Lochia: appropriate Uterine Fundus: firm Incision: healing well, no significant drainage, no dehiscence, no significant erythema DVT Evaluation: No evidence of DVT seen on physical exam. Negative Homan's sign. No cords or calf tenderness. No significant calf/ankle edema.  Recent Labs    08/08/18 0607  HGB 10.3*  HCT 30.7*    Assessment/Plan: Status post Cesarean section. Doing well postoperatively.  Continue current care. Babies doing well in NICU.   Tyson Dense 08/10/2018, 8:29 AM

## 2018-08-11 MED ORDER — IBUPROFEN 800 MG PO TABS: 800.0000 mg | ORAL_TABLET | Freq: Four times a day (QID) | ORAL | 0 refills | Status: DC

## 2018-08-11 MED ORDER — OXYCODONE HCL 10 MG PO TABS: 10.0000 mg | ORAL_TABLET | ORAL | 0 refills | Status: DC | PRN

## 2018-08-11 NOTE — Plan of Care (Signed)
Discharge instructions given, Baby and me book reviewed. Pre-eclampsia s/s discussed, pp care, pp depression. Meds and f/u given

## 2018-08-11 NOTE — Lactation Note (Signed)
This note was copied from a baby's chart. Lactation Consultation Note  Patient Name: Caitlyn Ramos [id number] Date: 08/11/2018   Twins NICU 17 hours old.  Mother requested lactation. Mother pumped approx 10 ml this morning and is excited.  Mother has small knot at base of L breast and had questions about the hard area. Feels like mother's milk is starting to transition with milk duct filling. Massaged area and colostrum was expressed. Encouraged mother to continue pumping q 2-5-3 hours with 4 hr break at night. Mother reports her personal DEBP will arrive today.   Discussed pumping also at bedside and reviewed milk storage. Reviewed engorgement care.      Maternal Data    Feeding Feeding Type: Donor Breast Milk  LATCH Score                   Interventions    Lactation Tools Discussed/Used     Consult Status      [name] 08/11/2018, 11:06 AM

## 2018-08-11 NOTE — Progress Notes (Signed)
Patient's RN called to inform me that patient requests different pharmacy than in system.  Rx for Motrin and Oxycodone already sent.  Patient instructed to call pharmacy where Rx was sent to cancel, enter requested pharmacy and I will reenter medications.   Linda Hedges, DO

## 2018-08-11 NOTE — Discharge Instructions (Signed)
Call MD for T>100.4, heavy vaginal bleeding, severe abdominal pain, intractable nausea and/or vomiting, or respiratory distress.  Call office to schedule postpartum visit in 6 weeks.  Remove honeycomb dressing by one week postop.  Pelvic rest x 6 weeks.  No driving while taking narcotics.

## 2018-08-11 NOTE — Discharge Summary (Signed)
Obstetric Discharge Summary Reason for Admission: PPROM, di/di twins Prenatal Procedures: none Intrapartum Procedures: cesarean: low cervical, transverse Postpartum Procedures: none Complications-Operative and Postpartum: none Hemoglobin  Date Value Ref Range Status  08/08/2018 10.3 (L) 12.0 - 15.0 g/dL Final  08/04/2016 12.6 11.1 - 15.9 g/dL Final   HCT  Date Value Ref Range Status  08/08/2018 30.7 (L) 36.0 - 46.0 % Final   Hematocrit  Date Value Ref Range Status  08/04/2016 38.1 34.0 - 46.6 % Final    Physical Exam:  General: alert, cooperative and appears stated age 15: appropriate Uterine Fundus: firm Incision: healing well, no significant drainage, no dehiscence DVT Evaluation: No evidence of DVT seen on physical exam. Negative Homan's sign. No cords or calf tenderness.  Discharge Diagnoses: PPROM, Di/Di twins  Discharge Information: Date: 08/11/2018 Activity: pelvic rest Diet: routine Medications: PNV, Ibuprofen and Oxycodone Condition: stable Instructions: refer to practice specific booklet Discharge to: home   Newborn Data:   Aysha, Livecchi [[id number]]  Live born female  Birth Weight: 2 lb 15.6 oz (1350 g) APGAR: 2, 2  Newborn Delivery   Birth date/time: 08/08/2018 00:44:00 Delivery type: C-Section, Low Transverse Trial of labor: No C-section categorization: Primary       Janaisa, [place] [[id number]]  Live born female  Birth Weight: 3 lb 6.3 oz (1540 g) APGAR: 7, 8  Newborn Delivery   Birth date/time: 08/[birth year]/2018 00:46:00 Delivery type: C-Section, Low Transverse Trial of labor: No C-section categorization: Primary      Home with mother.  Linda Hedges 08/11/2018, 9:03 AM

## 2018-08-11 NOTE — Clinical Social Work Maternal (Signed)
CLINICAL SOCIAL WORK MATERNAL/CHILD NOTE  Patient Details  Name: Caitlyn Ramos MRN: [id number] Date of Birth: [birth year]  Date:  08/11/2018  Clinical Social Worker Initiating Note:  Abundio Miu, [place] Date/Time: Initiated:  08/11/18/1331     Child's Name:  Caitlyn Ramos:  Mother, Father(Father: Caitlyn Ramos)   Need for Interpreter:  None   Reason for Referral:  Parental Support of Premature Babies < 32 weeks/or Critically Ill babies   Address:  [place] [address]    Phone number:  [phone] (home)     Additional phone number:   Household Members/Support Persons (HM/SP):   Household Member/Support Person 1   HM/SP Name Relationship DOB or Age  HM/SP -1 Caitlyn Ramos FOB/Husband    HM/SP -2        HM/SP -3        HM/SP -4        HM/SP -5        HM/SP -6        HM/SP -7        HM/SP -8          Natural Supports (not living in the home):  Parent, Social worker, Education administrator, Friends, Extended Family, Immediate Family   Professional Supports: None   Employment: Animator   Type of Work: Equities trader   Education:  Forensic psychologist   Homebound arranged:    Museum/gallery curator Resources:  Multimedia programmer   Other Resources:      Cultural/Religious Considerations Which May Impact Care:    Strengths:  Ability to meet basic needs , Engineer, materials, Understanding of illness   Psychotropic Medications:         Pediatrician:    [place] area  Pediatrician List:   [facility] [place]      Pediatrician Fax Number:    Risk Factors/Current Problems:  None   Cognitive State:  Able to Concentrate , Alert , Insightful , Linear Thinking , Goal Oriented    Mood/Affect:  Calm , Relaxed , Happy , Interested    CSW Assessment: CSW met with MOB at bedside to discuss infants NICU admission, FOB  present. CSW introduced self and explained reason for consult. MOB and FOB were welcoming, pleasant and engaged during assessment. MOB reported that she resides with FOB/Husband and works as a Equities trader at [employer] for [place] reported that they have an upcoming baby shower and anticipates getting a lot of items for infants. CSW inquired about MOB's and FOB's support system, MOB reported that her Ramos, in-laws, neighbors, aunts, uncles and friends were her supports.  CSW and MOB/FOB discussed infants NICU admission. MOB reported that it has been good so far and that the nurses are helpful and caring. MOB reported that she feels well informed about infants care. CSW informed MOB and FOB about the NICU, what to expect and resources/supports available while infants are admitted to the NICU. CSW inquired about transportation barriers, MOB reported that she will not be able to drive initially because of C section but has a good network of support that can bring her to the hospital. FOB reported that he will be returning to work but can go to work late if he needs to bring MOB to the hospital. MOB and FOB denied any questions/concerns about the NICU. CSW provided  MOB with 2 butterflies from [employer].   CSW asked FOB to leave the room to speak with MOB alone, FOB left voluntarily. CSW inquired about MOB's mental health history, MOB denied any mental health history. CSW inquired about how MOB was feeling currently, MOB reported that emotionally she felt fine and physically she felt better and better each day. MOB presented calm and did not demonstrate any acute mental health signs/symptoms. CSW assessed for safety, MOB denied SI, HI and domestic violence.   CSW provided education regarding the baby blues period vs. perinatal mood disorders, discussed treatment and gave resources for mental health follow up if concerns arise.  CSW recommends self-evaluation during the postpartum time period  using the New Mom Checklist from Postpartum Progress and encouraged MOB to contact a medical professional if symptoms are noted at any time.    CSW will continue to offer support/resources while infants are admitted to the NICU.   CSW Plan/Description:  Perinatal Mood and Anxiety Disorder (PMADs) Education, Other Patient/Family Education    Burnis Medin, LCSW 08/11/2018, 1:34 PM

## 2018-08-16 ENCOUNTER — Ambulatory Visit: Payer: Self-pay

## 2018-08-16 NOTE — Lactation Note (Signed)
This note was copied from a baby's chart. Lactation Consultation Note  Patient Name: Caitlyn Ramos [id number]'P Date: 08/16/2018 Reason for consult: Follow-up assessment;Preterm <34wks;Primapara;1st time breastfeeding;Multiple gestation;Infant < 6lbs  Visited with mom of 47 days old pre-term twins, she had questions about BF and pumping. Mom is getting about 30 ml combined per pumping session and she wanted to know what else could she do up up her supply, she had a c-section but reported (+) breast changes during the pregnancy. Discussed pumping schedule and the duration of pumping sessions, sleep cycle, galactogogues, benefits of premature  Milk and coconut oil. Mom is currently using size # 24 flanges, which seem appropriate, she told LC she tried the # 27 and they're too big.   Feeding plan:  1. Encouraged mom to start pumping for 20 minutes at a time instead of initiation setting 2.  Mom will try power pumping twice a day for 7 days 3. She'll also start using coconut oil prior pumping instead of using it after 4. She'll continue pumping every 2-3 hours during the day and get at least 4 hours of sleep at night, mom has been just getting cat naps at night.  Dad present and supportive she was doing STS with one of the twins. Parents reported all questions and concerns were answered, they're both aware of [place] [facility] OP services and will call PRN.  Maternal Data    Feeding    LATCH Score                   Interventions Interventions: Breast feeding basics reviewed;DEBP;Coconut oil  Lactation Tools Discussed/Used Tools: Coconut oil   Consult Status Consult Status: PRN    Alizon Schmeling Francene Boyers 08/16/2018, 8:59 PM

## 2018-08-22 ENCOUNTER — Ambulatory Visit: Payer: Self-pay

## 2018-08-22 NOTE — Lactation Note (Signed)
This note was copied from a baby's chart. Lactation Consultation Note  Patient Name: Caitlyn Ramos [id number] Date: 08/22/2018 Reason for consult: Preterm <34wks;1st time breastfeeding;NICU baby;Infant < 6lbs;Follow-up assessment;Other (Comment);Mother's request(called by the NICU RN at moms request with MS questions)  Babies ( Twins in NICU ) at 20 weeks old.  LC was called due to mom having questions with milk supply.  Per mom has been power pumping after 5 hours of sleep with EBM 50-70 ml.  And pumping every 3 hours days and evenings , and last pump of the dad is at 12MN - power pumping  With EBM of 40 -50 ml. The EBM during day and evening total from both breast is 30 ml. Total 250 ml a day.  Per mom is drinking large amount of water, calories are good, and is using the #24 F for both breast with comfort.  Mom expressed she was wondering if she should go down in size for her flanges.  LC offered to check her nipples/ areolas  for sizing and showed mom how much of the areola should be stimulated with  The flange. LC recommended staying with the #24 F and not to decrease.  LC also recommended googling low milk supply. Kellymom.[email or web address], and consider taking Mother love product with the 3 herbs  Safe for breastmilk. Call her Dr. Helane Ramos and ask for a prescription for Reglan. Mom mentioned she has not hx of depression or anxiety. Herbs and Reglan has been known to increase milk supply.  Mom aware of the lactation cookies.  LC recommended to continue pumping regimen.  LC encouraged mom to have nurse call [place] if further questions.      Interventions Interventions: Breast feeding basics reviewed;DEBP  Lactation Tools Discussed/Used     Consult Status Consult Status: PRN Date: (baby in NICU) Follow-up type: In-patient    [place] 08/22/2018, 3:50 PM

## 2018-09-02 DIAGNOSIS — R309 Painful micturition, unspecified: Secondary | ICD-10-CM | POA: Diagnosis not present

## 2018-09-02 DIAGNOSIS — R300 Dysuria: Secondary | ICD-10-CM | POA: Diagnosis not present

## 2018-09-06 ENCOUNTER — Ambulatory Visit: Payer: Self-pay

## 2018-09-06 NOTE — Lactation Note (Signed)
This note was copied from a baby's chart. Lactation Consultation Note  Patient Name: Caitlyn Ramos [id number] Date: 09/06/2018 Reason for consult: NICU baby;1st time breastfeeding;Primapara;Infant < 6lbs;Multiple gestation;Preterm <34wks Baby A is 75 weeks old, RN requested an [place] 1st latch feeding assessment.  Baby awake, rooting alittle, LC assisted to latch on the right breast 1st without the NS and baby latched but unable to sustain depth .  LC applied the #20 NS and baby latched deeper and fed for 5 mins with swallows and milk in the Nipple Shield after the feeding.  Per mom mentioned her Dr. Prescribed Reglan and she has been on it 2 days and her pumped ozs have increased from 6 to 8 in 24 hours.  [place] praised mom for her consistent pumping.  See Baby B chart for his feeding.   Maternal Data Has patient been taught Hand Expression?: Yes Does the patient have breastfeeding experience prior to this delivery?: No  Feeding Feeding Type: Breast Fed  LATCH Score Latch: Repeated attempts needed to sustain latch, nipple held in mouth throughout feeding, stimulation needed to elicit sucking reflex.  Audible Swallowing: Spontaneous and intermittent  Type of Nipple: Everted at rest and after stimulation  Comfort (Breast/Nipple): Soft / non-tender  Hold (Positioning): Assistance needed to correctly position infant at breast and maintain latch.  LATCH Score: 8  Interventions Interventions: Breast feeding basics reviewed;Assisted with latch;Skin to skin;Breast massage;Breast compression;Adjust position;Support pillows;Position options  Lactation Tools Discussed/Used     Consult Status Consult Status: PRN Follow-up type: In-patient    [place] 09/06/2018, 5:52 PM

## 2018-09-06 NOTE — Lactation Note (Signed)
This note was copied from a baby's chart. Lactation Consultation Note  Patient Name: Jada Fass GLOVF'I Date: 09/06/2018 Reason for consult: Follow-up assessment;Primapara;Multiple gestation Baby B was assisted to latch by LC . LC 1st tried to latch with the NS and the baby was unable to sustain the latch for only a few sucks.  LC latched well with a #20 NS and swallows noted for 5 mins , and milk in the NS . After the 5 mins didn't seem interested and the baby will get his tube feeding.  LC encouraged mom to have the NICU RN call again for feeding assessment when she is here during the day.   Maternal Data    Feeding Feeding Type: Breast Fed  LATCH Score Latch: Grasps breast easily, tongue down, lips flanged, rhythmical sucking.  Audible Swallowing: Spontaneous and intermittent  Type of Nipple: Everted at rest and after stimulation  Comfort (Breast/Nipple): Soft / non-tender  Hold (Positioning): Assistance needed to correctly position infant at breast and maintain latch.  LATCH Score: 9  Interventions Interventions: Breast feeding basics reviewed;Assisted with latch;Skin to skin  Lactation Tools Discussed/Used Tools: Nipple Shields Nipple shield size: 20   Consult Status Consult Status: PRN Follow-up type: In-patient    [place] 09/06/2018, 7:23 PM

## 2018-09-19 DIAGNOSIS — Z1389 Encounter for screening for other disorder: Secondary | ICD-10-CM | POA: Diagnosis not present

## 2018-09-19 DIAGNOSIS — Z3202 Encounter for pregnancy test, result negative: Secondary | ICD-10-CM | POA: Diagnosis not present

## 2018-09-19 DIAGNOSIS — Z30430 Encounter for insertion of intrauterine contraceptive device: Secondary | ICD-10-CM | POA: Diagnosis not present

## 2018-09-21 ENCOUNTER — Ambulatory Visit: Payer: Self-pay

## 2018-09-21 NOTE — Lactation Note (Signed)
This note was copied from a baby's chart. Lactation Consultation Note  Patient Name: Caitlyn Ramos [id number] Date: 09/21/2018 Reason for consult: Follow-up assessment;Mother's request;NICU baby;1st time breastfeeding;Preterm <34wks  Ms. Penner requested [place] assistance with breast feeding twin baby B, Ace. Ace has been taking a bottle well and has been showing readiness cues for breast feeding including rooting and some brief latches.  I assisted with placement of Ace in football hold on mom's right breast. He appeared sleepy and did not open his mouth to latch. I showed mom how to stimulate baby's filtrum with her nipple. Mom reports that he may be tired at this visit because he has been bottle feeding more frequently today.   After a few attempts, I suggested mom bottle feed baby a bit. She fed him for a few minutes and we then attempted to put him to breast again. He showed some root, but no latch. Mom's breasts began to leak (let-down), and I suggested we place a nipple shield on her nipple.  Ace latched to a size 20 nipples shield with some good suckling bursts, and then he became tired after about 5 minutes of breast feeding. We discontinued the feeding.  I praised mom for her efforts to latch baby and for her latch technique. The only adjustments I made were to lean her back and to place a nipple shield. Baby has latched previously without one, but he was able to sustain the latch longer with the use of the NS.  We discussed typical feeding patterns for a baby who is now LPTI, and I encouraged mom to look for gradual improvements. I encouraged her to call lactation again for assistance when ready.  Baby A is not yet showing readiness for breast feeding.  Mom appeared tired, and we discontinued our visit. She stated that her pumping volume is insufficient for twins, but she is pumping frequently. At the next [facility] consult, I recommend further discussion of her pumping routine and milk  production.  Feeding Feeding Type: Breast Milk with Formula added Nipple Type: Nfant Slow Flow (purple)  LATCH Score Latch: Repeated attempts needed to sustain latch, nipple held in mouth throughout feeding, stimulation needed to elicit sucking reflex.  Audible Swallowing: A few with stimulation  Type of Nipple: Everted at rest and after stimulation  Comfort (Breast/Nipple): Soft / non-tender  Hold (Positioning): Assistance needed to correctly position infant at breast and maintain latch.  LATCH Score: 7  Interventions Interventions: Breast feeding basics reviewed;Assisted with latch;Hand express;Breast compression;Adjust position;Support pillows  Lactation Tools Discussed/Used Tools: Nipple Shields Nipple shield size: 20   Consult Status Consult Status: PRN Follow-up type: Call as needed    Lenore Manner 09/21/2018, 6:25 PM

## 2018-09-28 DIAGNOSIS — R309 Painful micturition, unspecified: Secondary | ICD-10-CM | POA: Diagnosis not present

## 2018-09-28 DIAGNOSIS — N390 Urinary tract infection, site not specified: Secondary | ICD-10-CM | POA: Diagnosis not present

## 2018-10-06 DIAGNOSIS — N390 Urinary tract infection, site not specified: Secondary | ICD-10-CM | POA: Diagnosis not present

## 2018-10-06 DIAGNOSIS — R309 Painful micturition, unspecified: Secondary | ICD-10-CM | POA: Diagnosis not present

## 2018-11-03 DIAGNOSIS — Z30431 Encounter for routine checking of intrauterine contraceptive device: Secondary | ICD-10-CM | POA: Diagnosis not present

## 2018-11-21 DIAGNOSIS — M7912 Myalgia of auxiliary muscles, head and neck: Secondary | ICD-10-CM | POA: Diagnosis not present

## 2018-11-21 DIAGNOSIS — M9902 Segmental and somatic dysfunction of thoracic region: Secondary | ICD-10-CM | POA: Diagnosis not present

## 2018-11-21 DIAGNOSIS — S46811A Strain of other muscles, fascia and tendons at shoulder and upper arm level, right arm, initial encounter: Secondary | ICD-10-CM | POA: Diagnosis not present

## 2018-11-21 DIAGNOSIS — M9901 Segmental and somatic dysfunction of cervical region: Secondary | ICD-10-CM | POA: Diagnosis not present

## 2018-11-24 DIAGNOSIS — M9902 Segmental and somatic dysfunction of thoracic region: Secondary | ICD-10-CM | POA: Diagnosis not present

## 2018-11-24 DIAGNOSIS — M9901 Segmental and somatic dysfunction of cervical region: Secondary | ICD-10-CM | POA: Diagnosis not present

## 2018-11-24 DIAGNOSIS — M7912 Myalgia of auxiliary muscles, head and neck: Secondary | ICD-10-CM | POA: Diagnosis not present

## 2018-11-24 DIAGNOSIS — S46811A Strain of other muscles, fascia and tendons at shoulder and upper arm level, right arm, initial encounter: Secondary | ICD-10-CM | POA: Diagnosis not present

## 2018-11-28 DIAGNOSIS — S46811A Strain of other muscles, fascia and tendons at shoulder and upper arm level, right arm, initial encounter: Secondary | ICD-10-CM | POA: Diagnosis not present

## 2018-11-28 DIAGNOSIS — M9901 Segmental and somatic dysfunction of cervical region: Secondary | ICD-10-CM | POA: Diagnosis not present

## 2018-11-28 DIAGNOSIS — M7912 Myalgia of auxiliary muscles, head and neck: Secondary | ICD-10-CM | POA: Diagnosis not present

## 2018-11-28 DIAGNOSIS — M9902 Segmental and somatic dysfunction of thoracic region: Secondary | ICD-10-CM | POA: Diagnosis not present

## 2018-12-01 LAB — HEPATIC FUNCTION PANEL
AST: 38 — AB (ref 13–35)
Alkaline Phosphatase: 99 (ref 25–125)
Bilirubin, Total: 0.3

## 2018-12-01 LAB — BASIC METABOLIC PANEL
BUN: 17 (ref 4–21)
Creatinine: 0.7 (ref <=1.1)
Glucose: 63
Potassium: 4.6 (ref 3.4–5.3)
Sodium: 138 (ref 137–147)

## 2018-12-01 LAB — CBC AND DIFFERENTIAL
HCT: 37 (ref 36–46)
Hemoglobin: 12.4 (ref 12.0–16.0)
Neutrophils Absolute: 3
Platelets: 296 (ref 150–399)
WBC: 4.7

## 2018-12-01 LAB — COMPREHENSIVE METABOLIC PANEL: GFR calc non Af Amer: 112

## 2018-12-01 LAB — LIPID PANEL
Cholesterol: 128 (ref 0–200)
HDL: 46 (ref 35–70)
LDL Cholesterol: 64
Triglycerides: 93 (ref 40–160)

## 2018-12-01 LAB — TSH: TSH: 0.01 — AB (ref 0.41–5.90)

## 2018-12-01 LAB — CBC: RBC: 4.52 (ref 3.87–5.11)

## 2018-12-08 ENCOUNTER — Telehealth: Payer: Self-pay | Admitting: Adult Health

## 2018-12-08 DIAGNOSIS — E0590 Thyrotoxicosis, unspecified without thyrotoxic crisis or storm: Secondary | ICD-10-CM

## 2018-12-08 NOTE — Telephone Encounter (Signed)
Patient called concerned, states [facility] & [facility] drawn--she was just advised of lab results & is very concerned --Patient to have clinic fax lab results to provider for review & wishes to be advised on what to do..  Advised pt to wait until provider has Lab records & reviews them .(gv her office fax #)  --fyi to med asst to be on the look out for information .  --glh

## 2018-12-12 ENCOUNTER — Emergency Department (HOSPITAL_COMMUNITY): Payer: BC Managed Care – PPO

## 2018-12-12 ENCOUNTER — Emergency Department (HOSPITAL_COMMUNITY)
Admission: EM | Admit: 2018-12-12 | Discharge: 2018-12-12 | Disposition: A | Discharge status: Home / Self Care | Payer: BC Managed Care – PPO | Attending: Emergency Medicine | Admitting: Emergency Medicine

## 2018-12-12 ENCOUNTER — Other Ambulatory Visit: Payer: Self-pay

## 2018-12-12 ENCOUNTER — Ambulatory Visit: Payer: BC Managed Care – PPO | Admitting: Adult Health

## 2018-12-12 DIAGNOSIS — E0590 Thyrotoxicosis, unspecified without thyrotoxic crisis or storm: Secondary | ICD-10-CM | POA: Diagnosis not present

## 2018-12-12 DIAGNOSIS — R0789 Other chest pain: Secondary | ICD-10-CM | POA: Diagnosis not present

## 2018-12-12 DIAGNOSIS — H532 Diplopia: Secondary | ICD-10-CM | POA: Diagnosis not present

## 2018-12-12 DIAGNOSIS — R079 Chest pain, unspecified: Secondary | ICD-10-CM | POA: Diagnosis not present

## 2018-12-12 LAB — CBC
HCT: 42 % (ref 36.0–46.0)
Hemoglobin: 13.1 g/dL (ref 12.0–15.0)
MCH: 27.5 pg (ref 26.0–34.0)
MCHC: 31.2 g/dL (ref 30.0–36.0)
MCV: 88.2 fL (ref 80.0–100.0)
Platelets: 274 10*3/uL (ref 150–400)
RBC: 4.76 MIL/uL (ref 3.87–5.11)
RDW: 14.8 % (ref 11.5–15.5)
WBC: 2.2 10*3/uL — ABNORMAL LOW (ref 4.0–10.5)
nRBC: 0 % (ref 0.0–0.2)

## 2018-12-12 LAB — I-STAT BETA HCG BLOOD, ED (MC, WL, AP ONLY): I-stat hCG, quantitative: <5 m[IU]/mL (ref <5)

## 2018-12-12 LAB — BASIC METABOLIC PANEL
Anion gap: 12 (ref 5–15)
BUN: 9 mg/dL (ref 6–20)
CO2: 23 mmol/L (ref 22–32)
Calcium: 9.1 mg/dL (ref 8.9–10.3)
Chloride: 106 mmol/L (ref 98–111)
Creatinine, Ser: 0.6 mg/dL (ref 0.44–1.00)
GFR calc Af Amer: >60 mL/min (ref >60)
GFR calc non Af Amer: >60 mL/min (ref >60)
Glucose, Bld: 82 mg/dL (ref 70–99)
Potassium: 3.9 mmol/L (ref 3.5–5.1)
Sodium: 141 mmol/L (ref 135–145)

## 2018-12-12 LAB — TROPONIN I (HIGH SENSITIVITY)
Troponin I (High Sensitivity): <2 ng/L (ref <18)
Troponin I (High Sensitivity): <2 ng/L (ref <18)

## 2018-12-12 LAB — T4, FREE: Free T4: 2.18 ng/dL — ABNORMAL HIGH (ref 0.61–1.12)

## 2018-12-12 LAB — TSH: TSH: <0.01 u[IU]/mL — ABNORMAL LOW (ref 0.350–4.500)

## 2018-12-12 MED ORDER — SODIUM CHLORIDE 0.9 % IV BOLUS
1000.0000 mL | Freq: Once | INTRAVENOUS | Status: AC
  Administered 2018-12-12: 1000 mL via INTRAVENOUS

## 2018-12-12 MED ORDER — METOPROLOL TARTRATE 5 MG/5ML IV SOLN: 2.5000 mg | Freq: Once | INTRAVENOUS | Status: DC

## 2018-12-12 MED ORDER — METHIMAZOLE 10 MG PO TABS: 10.0000 mg | ORAL_TABLET | Freq: Three times a day (TID) | ORAL | 0 refills | Status: DC

## 2018-12-12 NOTE — Telephone Encounter (Signed)
Caitlyn Ramos, Pt needs immediate medical evaluation- please stress that she needs to seen at nearest ED. I reviewed [facility] Labs TSH- extremely low <0.005- please place urgent referral to Endocrinology  Of Note- last TSH 08/04/2016 - 2.290 Recommend that she make OV in a few weeks to review labs. Thanks! Valetta Fuller

## 2018-12-12 NOTE — ED Triage Notes (Signed)
Pt endorses chest pain x 1 week with sweating. Seen by a dr last week and had labs drawn that showed a high t4 level. Tachy and hypertensive. Axox4.

## 2018-12-12 NOTE — Telephone Encounter (Signed)
Please review labs placed in your box.  Pt is also c/o chest pains, diaphresis, double vision and migraines over the weekend.  Advised pt that she needs to be seen at ED, however she refuses, stating that she has no way to the hospital and cannot leave her newborn twins with anyone else.  Pt is requesting OV.  Please advise!  Charyl Bigger, CMA

## 2018-12-12 NOTE — Discharge Instructions (Signed)
As we discussed today, your work-up was reassuring.  I suspect your symptoms are secondary to thyroid.  As we discussed, you will need to follow-up with an endocrinologist.  I provided you a referral.  In the meantime, he will start on methimazole.  Take this medication as directed.  Return the emergency department for any worsening chest pain, difficulty breathing, vomiting or any other worsening or concerning symptoms.

## 2018-12-12 NOTE — Telephone Encounter (Signed)
Once again, advised pt that she needs to be seen at the ED.  Pt is agreeable now and a copy of her labs from the [facility] were placed at front desk for pt to pick up per her request.  Advised pt that she needs f/u with [facility] in approximately 2 weeks.  Pt expressed understanding.  Charyl Bigger, CMA

## 2018-12-12 NOTE — ED Provider Notes (Signed)
[facility] EMERGENCY DEPARTMENT Provider Note   CSN: [id number] Arrival date & time: 12/12/18  1043     History   Chief Complaint Chief Complaint  Patient presents with  . Chest Pain    HPI Caitlyn Ramos is a 28 y.o. female past x-ray of abdominal pain, PCOS who presents for evaluation of chest pain that has been ongoing for the last 5 days.  She states that it started during the night and woke her up from sleep.  She describes it as intermittent and states it is not associated with any particular action.  She states that the chest pain is not brought on by exertion.  She has not had any associated nausea/vomiting with the pain.  Additionally, she does not have any shortness of breath.  Pain is not worse with deep inspiration or with exertion.  She does report that she has had some constant diaphoresis.  He states that the pain is more sharp and achy in nature and is more so in the middle of her chest.  She does state that she feels like it radiates to her back.  Patient also reports that she had intermittent blurry vision beginning about 2 days ago.  She initially thought it was because she had her contacts in but states that she took them out and continued to have intermittent blurry vision.  She states that this does not occur all the time but just happens intermittently.  She states that she had gotten some blood work done by her primary care doctor on 12/02/2018 which showed her TSH to be 0.005 and a T4 of 19.0.  She was told that she would have to get in with an endocrinologist and has been referred but has not seen them yet.  When she started developing chest pain, her primary care doctor prompted her to go to the emergency department.  She denies any fevers, cough, difficulty breathing, abdominal pain, nausea/vomiting, numbness/weakness of arms or legs. She denies any OCP use, recent immobilization, prior history of DVT/PE, recent surgery, leg swelling, or long travel.     The history is provided by the patient.    Past Medical History:  Diagnosis Date  . Abdominal pain   . Cough   . Fibroid   . Generalized headaches   . Nasal congestion   . Nausea   . PCOS (polycystic ovarian syndrome)   . Sore throat     Patient Active Problem List   Diagnosis Date Noted  . S/P cesarean section 08/08/2018  . Preterm premature rupture of membranes (PPROM) with unknown onset of labor 08/07/2018  . Preterm labor 07/18/2018  . Amenorrhea 08/25/2017  . Health care maintenance 07/14/2016  . Vaginal yeast infection 07/14/2016  . Obesity (BMI 35.0-39.9 without comorbidity) 07/14/2016    Past Surgical History:  Procedure Laterality Date  . CESAREAN SECTION N/A 08/07/2018   Procedure: CESAREAN SECTION;  Surgeon: Linda Hedges, DO;  Location: [facility] ORS;  Service: Obstetrics;  Laterality: N/A;  . WISDOM TOOTH EXTRACTION  2009 approximate     OB History    Gravida  2   Para  1   Term      Preterm  1   AB  1   Living  2     SAB  1   TAB      Ectopic      Multiple  1   Live Births  2  Home Medications    Prior to Admission medications   Medication Sig Start Date End Date Taking? Authorizing Provider  acetaminophen (TYLENOL) 500 MG tablet Take 1,000 mg by mouth every 6 (six) hours as needed for mild pain.   Yes [provider]  ibuprofen (ADVIL) 200 MG tablet Take 1,000 mg by mouth every 6 (six) hours as needed for moderate pain.   Yes [provider]  Levonorgestrel (SKYLA) 13.5 MG IUD 1 each by Intrauterine route See admin instructions. Every 3 years   Yes [provider]  butalbital-acetaminophen-caffeine (FIORICET) 50-325-40 MG tablet Take by mouth 2 (two) times daily as needed for headache (prn).    [provider]  methimazole (TAPAZOLE) 10 MG tablet Take 1 tablet (10 mg total) by mouth 3 (three) times daily. 12/12/18 01/11/19  Volanda Napoleon, PA-C    Family History Family History   Problem Relation Age of Onset  . Heart disease Father   . Hypertension Father   . Thyroid disease Father   . Cancer Maternal Grandfather        lung  . Early death Maternal Grandfather   . Alcohol abuse Maternal Grandmother   . COPD Maternal Grandmother   . Cancer Paternal Grandmother   . Early death Paternal Grandmother   . Kidney disease Paternal Grandmother   . Diabetes Paternal Grandfather     Social History Social History   Tobacco Use  . Smoking status: Never Smoker  . Smokeless tobacco: Never Used  Substance Use Topics  . Alcohol use: No  . Drug use: No     Allergies   Patient has no known allergies.   Review of Systems Review of Systems  Constitutional: Negative for diaphoresis and fever.  Eyes: Positive for visual disturbance.  Respiratory: Negative for cough and shortness of breath.   Cardiovascular: Positive for chest pain. Negative for leg swelling.  Gastrointestinal: Negative for abdominal pain, nausea and vomiting.  Genitourinary: Negative for dysuria and hematuria.  Neurological: Negative for headaches.  All other systems reviewed and are negative.    Physical Exam Updated Vital Signs BP 129/78   Pulse 72   Temp 97.9 F (36.6 C) (Oral)   Resp 14   LMP 12/12/2018   SpO2 94%   Breastfeeding No   Physical Exam Vitals signs and nursing note reviewed.  Constitutional:      Appearance: Normal appearance. She is well-developed.  HENT:     Head: Normocephalic and atraumatic.  Eyes:     General: Lids are normal.     Conjunctiva/sclera: Conjunctivae normal.     Pupils: Pupils are equal, round, and reactive to light.  Neck:     Musculoskeletal: Full passive range of motion without pain.  Cardiovascular:     Rate and Rhythm: Regular rhythm. Tachycardia present.     Pulses: Normal pulses.     Heart sounds: Normal heart sounds. No murmur. No friction rub. No gallop.   Pulmonary:     Effort: Pulmonary effort is normal.     Breath sounds:  Normal breath sounds.     Comments: Lungs clear to auscultation bilaterally.  Symmetric chest rise.  No wheezing, rales, rhonchi. Abdominal:     Palpations: Abdomen is soft. Abdomen is not rigid.     Tenderness: There is no abdominal tenderness. There is no guarding.  Musculoskeletal: Normal range of motion.     Comments: Bilateral lower extremities are symmetric in appearance without any overlying warmth, erythema, edema.  Skin:  General: Skin is warm and dry.     Capillary Refill: Capillary refill takes less than 2 seconds.  Neurological:     Mental Status: She is alert and oriented to person, place, and time.  Psychiatric:        Speech: Speech normal.      ED Treatments / Results  Labs (all labs ordered are listed, but only abnormal results are displayed) Labs Reviewed  CBC - Abnormal; Notable for the following components:      Result Value   WBC 2.2 (*)    All other components within normal limits  TSH - Abnormal; Notable for the following components:   TSH <0.010 (*)    All other components within normal limits  T4, FREE - Abnormal; Notable for the following components:   Free T4 2.18 (*)    All other components within normal limits  BASIC METABOLIC PANEL  I-STAT BETA HCG BLOOD, ED ([facility], [facility], [facility] ONLY)  TROPONIN I (HIGH SENSITIVITY)  TROPONIN I (HIGH SENSITIVITY)    EKG EKG Interpretation  Date/Time:  Monday December 12 2018 10:52:31 EST Ventricular Rate:  113 PR Interval:  146 QRS Duration: 80 QT Interval:  318 QTC Calculation: 436 R Axis:   45 Text Interpretation: Sinus tachycardia Septal infarct , age undetermined Abnormal ECG No old tracing to compare Confirmed by Sherwood Gambler [phone]) on 12/12/2018 1:21:33 PM   Radiology Dg Chest 2 View  Result Date: 12/12/2018 CLINICAL DATA:  Chest pain EXAM: CHEST - 2 VIEW COMPARISON:  None. FINDINGS: The heart size and mediastinal contours are within normal limits. Both lungs are clear. No pleural effusion or  pneumothorax. The visualized skeletal structures are unremarkable. IMPRESSION: No acute process in the chest. Electronically Signed   By: Macy Mis M.D.   On: 12/12/2018 11:28   Ct Head Wo Contrast  Result Date: 12/12/2018 CLINICAL DATA:  Diplopia EXAM: CT HEAD WITHOUT CONTRAST TECHNIQUE: Contiguous axial images were obtained from the base of the skull through the vertex without intravenous contrast. COMPARISON:  None. FINDINGS: Brain: Ventricles are normal in size and configuration. There is no intracranial mass, hemorrhage, extra-axial fluid collection, or midline shift. The brain parenchyma appears unremarkable. There is no evident acute infarct. Vascular: There is no hyperdense vessel. No vascular calcifications are evident. Skull: The bony calvarium appears intact. Sinuses/Orbits: There is mucosal thickening in several ethmoid air cells. There is mucosal thickening in the medial left sphenoid sinus region. Visualized orbits appear symmetric bilaterally. Other: Mastoid air cells are clear. IMPRESSION: Foci of paranasal sinus disease.  Study otherwise unremarkable. Electronically Signed   By: Lowella Grip III M.D.   On: 12/12/2018 13:23    Procedures Procedures (including critical care time)  Medications Ordered in ED Medications  metoprolol tartrate (LOPRESSOR) injection 2.5 mg (has no administration in time range)  sodium chloride 0.9 % bolus 1,000 mL (0 mLs Intravenous Stopped 12/12/18 1457)     Initial Impression / Assessment and Plan / ED Course  I have reviewed the triage vital signs and the nursing notes.  Pertinent labs & imaging results that were available during my care of the patient were reviewed by me and considered in my medical decision making (see chart for details).        28 year old female who presents for evaluation of chest pain has been ongoing for 5 days and intermittent double vision over the last 2 days.  Recently had blood work done that showed low  TSH, high T4.  Was prompted  to go to the emergency department by her primary care doctor.  Initially arrival, she is afebrile but is tachycardic and hypertensive.  Vitals otherwise stable.  Doubt ACS given atypical nature of her symptoms.  Additionally, history/physical exam is not concerning for PE.  She has no shortness of breath and is not hypoxic.  Suspect that this may be secondary to thyroid issues.  Given vision symptoms, will plan for CT head to rule any acute intracranial normality.  CBC shows leukopenia of 2.2.  Hemoglobin stable.  BMP is unremarkable.  Troponin negative. CXR negative for any acute abnormality.   CT head shows focal area of personal disease.  Otherwise unremarkable.  Her TSH here is 0.010 and her T4 is 2.18, which is elevated.  Her delta troponin is negative.  At this time, I feel her symptoms are suggestive of hyperthyroidism and that she is symptomatic from it.  She has not been able to be seen by endocrinology yet.  Her vitals here have remained stable without any interventions.  Additionally, when she came in she was tachycardic but her tachycardia has resolved with any difficulties and her blood pressure has maintained.  At this time, we will hold off on starting her on any beta-blocker but will start her on methimazole.  We will give her outpatient referral to endocrinology. Discussed patient with Dr. Regenia Skeeter who is agreeable to plan.   Discussed results with patient.  She is agreeable.  Patient instructed to follow-up with endocrinology as directed. At this time, patient exhibits no emergent life-threatening condition that require further evaluation in ED or admission. Patient had ample opportunity for questions and discussion. All patient's questions were answered with full understanding. Strict return precautions discussed. Patient expresses understanding and agreement to plan.   Portions of this note were generated with [employer]. Dictation errors may  occur despite best attempts at proofreading.   Final Clinical Impressions(s) / ED Diagnoses   Final diagnoses:  Atypical chest pain  Hyperthyroidism    ED Discharge Orders         Ordered    methimazole (TAPAZOLE) 10 MG tablet  3 times daily     12/12/18 1443           Volanda Napoleon, PA-C 12/12/18 1656    Sherwood Gambler, MD 12/13/18 [phone]

## 2018-12-14 ENCOUNTER — Other Ambulatory Visit: Payer: Self-pay

## 2018-12-16 ENCOUNTER — Other Ambulatory Visit: Payer: Self-pay

## 2018-12-16 ENCOUNTER — Encounter: Payer: Self-pay | Admitting: Internal Medicine

## 2018-12-16 ENCOUNTER — Ambulatory Visit (INDEPENDENT_AMBULATORY_CARE_PROVIDER_SITE_OTHER): Payer: BC Managed Care – PPO | Admitting: Internal Medicine

## 2018-12-16 VITALS — BP 128/78 | HR 101 | Ht 67.0 in | Wt 316.0 lb

## 2018-12-16 DIAGNOSIS — E0590 Thyrotoxicosis, unspecified without thyrotoxic crisis or storm: Secondary | ICD-10-CM | POA: Diagnosis not present

## 2018-12-16 HISTORY — DX: Thyrotoxicosis, unspecified without thyrotoxic crisis or storm: E05.90

## 2018-12-16 LAB — TSH: TSH: <0.01 u[IU]/mL — ABNORMAL LOW (ref 0.35–4.50)

## 2018-12-16 LAB — T4, FREE: Free T4: 1.73 ng/dL — ABNORMAL HIGH (ref 0.60–1.60)

## 2018-12-16 LAB — T3, FREE: T3, Free: 6.6 pg/mL — ABNORMAL HIGH (ref 2.3–4.2)

## 2018-12-16 MED ORDER — ATENOLOL 25 MG PO TABS: 25.0000 mg | ORAL_TABLET | Freq: Every day | ORAL | 3 refills | Status: DC

## 2018-12-16 NOTE — Patient Instructions (Addendum)
Please continue: - Methimazole 10 mg 3x a day with meals  Please start: - Atenolol 25 mg daily  Please stop at the lab.  Please come back for a follow-up appointment in 3 months.   Hyperthyroidism  Hyperthyroidism is when the thyroid gland is too active (overactive). The thyroid gland is a small gland located in the lower front part of the neck, just in front of the windpipe (trachea). This gland makes hormones that help control how the body uses food for energy (metabolism) as well as how the heart and brain function. These hormones also play a role in keeping your bones strong. When the thyroid is overactive, it produces too much of a hormone called thyroxine. What are the causes? This condition may be caused by:  Graves' disease. This is a disorder in which the body's disease-fighting system (immune system) attacks the thyroid gland. This is the most common cause.  Inflammation of the thyroid gland.  A tumor in the thyroid gland.  Use of certain medicines, including: ? Prescription thyroid hormone replacement. ? Herbal supplements that mimic thyroid hormones. ? Amiodarone therapy.  Solid or fluid-filled lumps within your thyroid gland (thyroid nodules).  Taking in a large amount of iodine from foods or medicines. What increases the risk? You are more likely to develop this condition if:  You are female.  You have a family history of thyroid conditions.  You smoke tobacco.  You use a medicine called lithium.  You take medicines that affect the immune system (immunosuppressants). What are the signs or symptoms? Symptoms of this condition include:  Nervousness.  Inability to tolerate heat.  Unexplained weight loss.  Diarrhea.  Change in the texture of hair or skin.  Heart skipping beats or making extra beats.  Rapid heart rate.  Loss of menstruation.  Shaky hands.  Fatigue.  Restlessness.  Sleep problems.  Enlarged thyroid gland or a lump in the  thyroid (nodule). You may also have symptoms of Graves' disease, which may include:  Protruding eyes.  Dry eyes.  Red or swollen eyes.  Problems with vision. How is this diagnosed? This condition may be diagnosed based on:  Your symptoms and medical history.  A physical exam.  Blood tests.  Thyroid ultrasound. This test involves using sound waves to produce images of the thyroid gland.  A thyroid scan. A radioactive substance is injected into a vein, and images show how much iodine is present in the thyroid.  Radioactive iodine uptake test (RAIU). A small amount of radioactive iodine is given by mouth to see how much iodine the thyroid absorbs after a certain amount of time. How is this treated? Treatment depends on the cause and severity of the condition. Treatment may include:  Medicines to reduce the amount of thyroid hormone your body makes.  Radioactive iodine treatment (radioiodine therapy). This involves swallowing a small dose of radioactive iodine, in capsule or liquid form, to kill thyroid cells.  Surgery to remove part or all of your thyroid gland. You may need to take thyroid hormone replacement medicine for the rest of your life after thyroid surgery.  Medicines to help manage your symptoms. Follow these instructions at home:   Take over-the-counter and prescription medicines only as told by your health care provider.  Do not use any products that contain nicotine or tobacco, such as cigarettes and e-cigarettes. If you need help quitting, ask your health care provider.  Follow any instructions from your health care provider about diet. You may be  instructed to limit foods that contain iodine.  Keep all follow-up visits as told by your health care provider. This is important. ? You will need to have blood tests regularly so that your health care provider can monitor your condition. Contact a health care provider if:  Your symptoms do not get better with  treatment.  You have a fever.  You are taking thyroid hormone replacement medicine and you: ? Have symptoms of depression. ? Feel like you are tired all the time. ? Gain weight. Get help right away if:  You have chest pain.  You have decreased alertness or a change in your awareness.  You have abdominal pain.  You feel dizzy.  You have a rapid heartbeat.  You have an irregular heartbeat.  You have difficulty breathing. Summary  The thyroid gland is a small gland located in the lower front part of the neck, just in front of the windpipe (trachea).  Hyperthyroidism is when the thyroid gland is too active (overactive) and produces too much of a hormone called thyroxine.  The most common cause is Graves' disease, a disorder in which your immune system attacks the thyroid gland.  Hyperthyroidism can cause various symptoms, such as unexplained weight loss, nervousness, inability to tolerate heat, or changes in your heartbeat.  Treatment may include medicine to reduce the amount of thyroid hormone your body makes, radioiodine therapy, surgery, or medicines to manage symptoms. This information is not intended to replace advice given to you by your health care provider. Make sure you discuss any questions you have with your health care provider. Document Released: 01/12/2005 Document Revised: 12/25/2016 Document Reviewed: 12/23/2016 Elsevier Patient Education  2020 [employer].

## 2018-12-16 NOTE — Progress Notes (Signed)
Patient ID: Caitlyn Ramos, female   DOB: 02-05-1990, 28 y.o.   MRN: [id number]   This visit occurred during the SARS-CoV-2 public health emergency.  Safety protocols were in place, including screening questions prior to the visit, additional usage of staff PPE, and extensive cleaning of exam room while observing appropriate contact time as indicated for disinfecting solutions.   HPI  Caitlyn Ramos is a 28 y.o.-year-old female, referred by her PCP, Caitlyn Pillow, NP, for evaluation and management of thyrotoxicosis.  Patient describes that she gave birth to premature twins (at 42 weeks) on 08/25/2018.  They were 2 months in the hospital, but now they are home and doing well.  After this, she developed heat intolerance and significant sweating >> had labs drawn >> thyrotoxicosis.  She then presented to the ED with chest pain, heat intolerance, double vision, and tachycardia on 12/12/2018 and she was found to be thyrotoxic.  She was started on methimazole 10 mg 3x a day.  She continues this today.  She was referred to endocrinology for further investigation and management.  I reviewed pt's thyroid tests: Lab Results  Component Value Date   TSH <0.010 (L) 12/12/2018   TSH 0.01 (A) 12/01/2018   TSH 2.290 08/04/2016   FREET4 2.18 (H) 12/12/2018   Antithyroid antibodies: No results found for: TSI  Pt denies: - feeling nodules in neck - hoarseness - dysphagia - choking - SOB with lying down  She denies: - no fatigue - + chest tightness >> improved - + excessive sweating/heat intolerance - no tremors - no anxiety - no palpitations - + hyperdefecation - no weight loss now but she lost 125 pounds, intentionally, before the pregnancy by changing her diet and working out with a Clinical research associate.  She gained 80 pounds during the pregnancy. - no hair loss - + Her double vision has resolved -only had this for 1 day.  Pt does not have a FH of thyroid ds. No FH of thyroid cancer. No h/o radiation tx to head  or neck.  No seaweed or kelp, no recent contrast studies. No steroid use. No herbal supplements. No Biotin use.  Pt. also has a history of father, PGF, P aunts - all hypothyroidism.  ROS: Constitutional: + see HPI Eyes: + Resolved blurry vision, no xerophthalmia ENT: no sore throat, + see HPI Cardiovascular: + Improved CP/no SOB/palpitations/leg swelling Respiratory: no cough/SOB Gastrointestinal: no N/V/D/C Musculoskeletal: no muscle/joint aches Skin: no rashes Neurological: + Tremors/no numbness/tingling/dizziness, + headache Psychiatric: no depression/anxiety + Low libido  Past Medical History:  Diagnosis Date  . Abdominal pain   . Cough   . Fibroid   . Generalized headaches   . Nasal congestion   . Nausea   . PCOS (polycystic ovarian syndrome)   . Sore throat    Past Surgical History:  Procedure Laterality Date  . CESAREAN SECTION N/A 08/07/2018   Procedure: CESAREAN SECTION;  Surgeon: Linda Hedges, DO;  Location: [facility];  Service: Obstetrics;  Laterality: N/A;  . WISDOM TOOTH EXTRACTION  2009 approximate   Social History   Socioeconomic History  . Marital status: Married    Spouse name: [name]  . Number of children: 2  . Years of education: Not on file  . Highest education level: Not on file  Occupational History  .  Stay-at-home mom  Social Needs  . Financial resource strain: Not hard at all  . Food insecurity    Worry: Never true    Inability: Never true  .  Transportation needs    Medical: No    Non-medical: No  Tobacco Use  . Smoking status: Never Smoker  . Smokeless tobacco: Never Used  Substance and Sexual Activity  . Alcohol use: No  . Drug use: No  . Sexual activity: Yes    Partners: Male   Current Outpatient Medications on File Prior to Visit  Medication Sig Dispense Refill  . acetaminophen (TYLENOL) 500 MG tablet Take 1,000 mg by mouth every 6 (six) hours as needed for mild pain.    . butalbital-acetaminophen-caffeine (FIORICET)  50-325-40 MG tablet Take by mouth 2 (two) times daily as needed for headache (prn).    Marland Kitchen ibuprofen (ADVIL) 200 MG tablet Take 1,000 mg by mouth every 6 (six) hours as needed for moderate pain.    . Levonorgestrel (SKYLA) 13.5 MG IUD 1 each by Intrauterine route See admin instructions. Every 3 years    . methimazole (TAPAZOLE) 10 MG tablet Take 1 tablet (10 mg total) by mouth 3 (three) times daily. 90 tablet 0   No current facility-administered medications on file prior to visit.    No Known Allergies Family History  Problem Relation Age of Onset  . Heart disease Father   . Hypertension Father   . Thyroid disease Father   . Cancer Maternal Grandfather        lung  . Early death Maternal Grandfather   . Alcohol abuse Maternal Grandmother   . COPD Maternal Grandmother   . Cancer Paternal Grandmother   . Early death Paternal Grandmother   . Kidney disease Paternal Grandmother   . Diabetes Paternal Grandfather     PE: BP 128/78   Pulse (!) 101   Ht 5\' 7"  (1.702 m)   Wt (!) 316 lb (143.3 kg)   LMP 12/12/2018   SpO2 99%   BMI 49.49 kg/m  Wt Readings from Last 3 Encounters:  12/16/18 (!) 316 lb (143.3 kg)  08/06/18 (!) 323 lb 0.6 oz (146.5 kg)  07/18/18 (!) 316 lb 3 oz (143.4 kg)   Constitutional: overweight, in NAD Eyes: PERRLA, EOMI, no exophthalmos, no lid lag, no stare ENT: moist mucous membranes, no thyromegaly, no thyroid bruits, no cervical lymphadenopathy Cardiovascular: Tachycardia, RR, No MRG Respiratory: CTA B Gastrointestinal: abdomen soft, NT, ND, BS+ Musculoskeletal: no deformities, strength intact in all 4 Skin: moist, warm, no rashes Neurological: + Tremor with outstretched hands, DTR normal in all 4  ASSESSMENT: 1. Thyrotoxicosis  PLAN:  1. Patient with a recently found suppressed TSH, with thyrotoxic sxs:  heat intolerance, hyperdefecation, chest pain, tremors.  - she does not appear to have exogenous causes for the low TSH.  - We discussed that  possible causes of thyrotoxicosis are:  Marland Kitchen Graves ds   . Thyroiditis-either subacute or postpartum . toxic multinodular goiter/ toxic adenoma (I cannot feel nodules at palpation of her thyroid). - will recheck the TSH, fT3 and fT4 and also add thyroid stimulating antibodies to screen for Graves' disease.  - If the tests remain abnormal, we may need an uptake and scan to differentiate between the 3 above possible etiologies  - we discussed about possible modalities of treatment for the above conditions, to include methimazole use, radioactive iodine ablation or (last resort) surgery.  She is not breast-feeding, but I would like to avoid RAI treatment for her due to possible exposure of her babies. - we may need to do thyroid ultrasound depending on the results of the uptake and scan (if a cold nodule is  present) - we need to add beta blockers at this time, since she is tachycardic or tremulous -we will start atenolol 25 mg daily and can increase to twice a day if the pulse at rest does not decrease under 90.  I advised her to monitor this at home. - no signs of Graves' ophthalmopathy now: she does not have any double vision, blurry vision, eye pain, chemosis.  However, she did have double vision when she drove to the emergency room, but this resolved completely.  She does have an ophthalmologist and is due for an exam.  I advised her to let him know about her new thyroid condition and also about the Graves' antibody titer. - I advised her to avoid strenuous exercise for now until he can get her thyroid test under control - I advised her to join my chart to communicate easier - RTC in 3 months, but sooner for repeat labs  Patient Instructions  Please continue: - Methimazole 10 mg 3x a day with meals  Please start: - Atenolol 25 mg daily  Please stop at the lab.  Please come back for a follow-up appointment in 3 months.  Component     Latest Ref Rng & Units 12/16/2018  TSH     0.35 - 4.50  uIU/mL <0.01 (L)  T4,Free(Direct)     0.60 - 1.60 ng/dL 1.73 (H)  Triiodothyronine,Free,Serum     2.3 - 4.2 pg/mL 6.6 (H)  TSI     <140 % baseline <89   TSH is still suppressed, but free thyroid hormones improved even though she was only started on methimazole days ago.  We can decrease the methimazole to only 10 mg twice a day and recheck her test again in 1 month. TSI's are not elevated, so the picture is not consistent with Graves' disease. For now, I will suggest to continue the methimazole, but would like to check thyroid uptake and scan to see if she has thyroiditis.  In this case, methimazole is not indicated.  I would advise her to stop the methimazole 5 days prior to the test.  Philemon Kingdom, MD PhD [facility] Endocrinology

## 2018-12-20 ENCOUNTER — Encounter: Payer: Self-pay | Admitting: Internal Medicine

## 2018-12-20 LAB — THYROID STIMULATING IMMUNOGLOBULIN: TSI: <89 % baseline (ref <140)

## 2018-12-20 MED ORDER — METHIMAZOLE 10 MG PO TABS: 10.0000 mg | ORAL_TABLET | Freq: Two times a day (BID) | ORAL | 2 refills | Status: DC

## 2018-12-21 ENCOUNTER — Other Ambulatory Visit: Payer: Self-pay | Admitting: Adult Health

## 2018-12-21 ENCOUNTER — Encounter: Payer: Self-pay | Admitting: Adult Health

## 2018-12-21 ENCOUNTER — Ambulatory Visit: Payer: BC Managed Care – PPO | Admitting: Internal Medicine

## 2018-12-21 DIAGNOSIS — R079 Chest pain, unspecified: Secondary | ICD-10-CM

## 2018-12-21 DIAGNOSIS — R030 Elevated blood-pressure reading, without diagnosis of hypertension: Secondary | ICD-10-CM

## 2018-12-23 ENCOUNTER — Emergency Department: Payer: BC Managed Care – PPO

## 2018-12-23 ENCOUNTER — Encounter: Payer: Self-pay | Admitting: Emergency Medicine

## 2018-12-23 ENCOUNTER — Other Ambulatory Visit: Payer: Self-pay

## 2018-12-23 ENCOUNTER — Emergency Department
Admission: EM | Admit: 2018-12-23 | Discharge: 2018-12-23 | Disposition: A | Discharge status: Home / Self Care | Payer: BC Managed Care – PPO | Attending: Emergency Medicine | Admitting: Emergency Medicine

## 2018-12-23 DIAGNOSIS — R001 Bradycardia, unspecified: Secondary | ICD-10-CM | POA: Diagnosis not present

## 2018-12-23 DIAGNOSIS — Z79899 Other long term (current) drug therapy: Secondary | ICD-10-CM | POA: Diagnosis not present

## 2018-12-23 DIAGNOSIS — K529 Noninfective gastroenteritis and colitis, unspecified: Secondary | ICD-10-CM | POA: Diagnosis not present

## 2018-12-23 DIAGNOSIS — R079 Chest pain, unspecified: Secondary | ICD-10-CM | POA: Diagnosis not present

## 2018-12-23 DIAGNOSIS — H538 Other visual disturbances: Secondary | ICD-10-CM | POA: Diagnosis not present

## 2018-12-23 DIAGNOSIS — K219 Gastro-esophageal reflux disease without esophagitis: Secondary | ICD-10-CM

## 2018-12-23 DIAGNOSIS — R112 Nausea with vomiting, unspecified: Secondary | ICD-10-CM

## 2018-12-23 DIAGNOSIS — R0789 Other chest pain: Secondary | ICD-10-CM | POA: Diagnosis not present

## 2018-12-23 DIAGNOSIS — R197 Diarrhea, unspecified: Secondary | ICD-10-CM

## 2018-12-23 LAB — CBC WITH DIFFERENTIAL/PLATELET
Abs Immature Granulocytes: 0.01 10*3/uL (ref 0.00–0.07)
Basophils Absolute: 0 10*3/uL (ref 0.0–0.1)
Basophils Relative: 0 %
Eosinophils Absolute: 0.2 10*3/uL (ref 0.0–0.5)
Eosinophils Relative: 3 %
HCT: 36.6 % (ref 36.0–46.0)
Hemoglobin: 11.8 g/dL — ABNORMAL LOW (ref 12.0–15.0)
Immature Granulocytes: 0 %
Lymphocytes Relative: 32 %
Lymphs Abs: 2.2 10*3/uL (ref 0.7–4.0)
MCH: 27.2 pg (ref 26.0–34.0)
MCHC: 32.2 g/dL (ref 30.0–36.0)
MCV: 84.3 fL (ref 80.0–100.0)
Monocytes Absolute: 0.6 10*3/uL (ref 0.1–1.0)
Monocytes Relative: 8 %
Neutro Abs: 3.8 10*3/uL (ref 1.7–7.7)
Neutrophils Relative %: 57 %
Platelets: 315 10*3/uL (ref 150–400)
RBC: 4.34 MIL/uL (ref 3.87–5.11)
RDW: 14.1 % (ref 11.5–15.5)
WBC: 6.8 10*3/uL (ref 4.0–10.5)
nRBC: 0 % (ref 0.0–0.2)

## 2018-12-23 LAB — TSH: TSH: <0.01 u[IU]/mL — ABNORMAL LOW (ref 0.350–4.500)

## 2018-12-23 LAB — LIPASE, BLOOD: Lipase: 25 U/L (ref 11–51)

## 2018-12-23 LAB — COMPREHENSIVE METABOLIC PANEL
ALT: 50 U/L — ABNORMAL HIGH (ref 0–44)
AST: 20 U/L (ref 15–41)
Albumin: 3.5 g/dL (ref 3.5–5.0)
Alkaline Phosphatase: 82 U/L (ref 38–126)
Anion gap: 8 (ref 5–15)
BUN: 17 mg/dL (ref 6–20)
CO2: 26 mmol/L (ref 22–32)
Calcium: 9 mg/dL (ref 8.9–10.3)
Chloride: 103 mmol/L (ref 98–111)
Creatinine, Ser: 0.79 mg/dL (ref 0.44–1.00)
GFR calc Af Amer: >60 mL/min (ref >60)
GFR calc non Af Amer: >60 mL/min (ref >60)
Glucose, Bld: 109 mg/dL — ABNORMAL HIGH (ref 70–99)
Potassium: 3.7 mmol/L (ref 3.5–5.1)
Sodium: 137 mmol/L (ref 135–145)
Total Bilirubin: 0.4 mg/dL (ref 0.3–1.2)
Total Protein: 6.7 g/dL (ref 6.5–8.1)

## 2018-12-23 LAB — HCG, QUANTITATIVE, PREGNANCY: hCG, Beta Chain, Quant, S: 1 m[IU]/mL (ref <5)

## 2018-12-23 LAB — T4, FREE: Free T4: 1.05 ng/dL (ref 0.61–1.12)

## 2018-12-23 LAB — TROPONIN I (HIGH SENSITIVITY)
Troponin I (High Sensitivity): 3 ng/L (ref <18)
Troponin I (High Sensitivity): <2 ng/L (ref <18)

## 2018-12-23 LAB — FIBRIN DERIVATIVES D-DIMER (ARMC ONLY): Fibrin derivatives D-dimer (ARMC): 451.63 ng/mL (FEU) (ref 0.00–499.00)

## 2018-12-23 MED ORDER — FAMOTIDINE IN NACL 20-0.9 MG/50ML-% IV SOLN
20.0000 mg | Freq: Once | INTRAVENOUS | Status: AC
  Administered 2018-12-23: 04:00:00 20 mg via INTRAVENOUS
  Filled 2018-12-23: qty 50

## 2018-12-23 MED ORDER — ALUM & MAG HYDROXIDE-SIMETH 400-400-40 MG/5ML PO SUSP: 5.0000 mL | Freq: Four times a day (QID) | ORAL | 0 refills | Status: DC | PRN

## 2018-12-23 MED ORDER — MORPHINE SULFATE (PF) 4 MG/ML IV SOLN
4.0000 mg | Freq: Once | INTRAVENOUS | Status: AC
  Administered 2018-12-23: 04:00:00 4 mg via INTRAVENOUS
  Filled 2018-12-23: qty 1

## 2018-12-23 MED ORDER — ONDANSETRON 4 MG PO TBDP: 4.0000 mg | ORAL_TABLET | Freq: Three times a day (TID) | ORAL | 0 refills | Status: DC | PRN

## 2018-12-23 MED ORDER — ALUM & MAG HYDROXIDE-SIMETH 200-200-20 MG/5ML PO SUSP
30.0000 mL | Freq: Once | ORAL | Status: AC
  Administered 2018-12-23: 30 mL via ORAL
  Filled 2018-12-23: qty 30

## 2018-12-23 MED ORDER — LIDOCAINE VISCOUS HCL 2 % MT SOLN
15.0000 mL | Freq: Once | OROMUCOSAL | Status: AC
  Administered 2018-12-23: 05:00:00 15 mL via ORAL
  Filled 2018-12-23: qty 15

## 2018-12-23 NOTE — ED Provider Notes (Signed)
[facility] Emergency Department Provider Note  ____________________________________________  Time seen: Approximately 3:48 AM  I have reviewed the triage vital signs and the nursing notes.   HISTORY  Chief Complaint Abdominal Pain and Diarrhea   HPI Caitlyn Ramos is a 28 y.o. female with a history of PCOS, recently diagnosed hyperthyroidism who presents for evaluation of chest pain.  Patient reports that she has been having chest pain intermittently   for the last 10 days.  At this point the chest pain has been attributed to her hyperthyroidism.  She has been started on atenolol and methimazole.  This evening she reports that the pain started again.  The pain is located in the center of her chest, she describes this as tightness and pressure radiating to her upper back.  No shortness of breath.  Pain associated with one episode of nonbloody nonbilious emesis.  Patient had several episodes of watery diarrhea today.  No melena.  No fever, cough, abdominal pain, body aches, loss of smell or taste.  The pain in her chest is not worse with deep inspiration, she has no leg pain or swelling, no personal family history of blood clots, no recent travel immobilization, no hemoptysis. She has an IUD.  She is status post c-section delivery of twins in July 2020.  Patient received full dose aspirin and 1 nitro per EMS with no changes in her chest pain.  She also received 4 mg of IV Zofran.  Past Medical History:  Diagnosis Date  . Abdominal pain   . Cough   . Fibroid   . Generalized headaches   . Nasal congestion   . Nausea   . PCOS (polycystic ovarian syndrome)   . Sore throat     Patient Active Problem List   Diagnosis Date Noted  . Thyrotoxicosis without thyroid storm 12/16/2018  . S/P cesarean section 08/08/2018  . Preterm premature rupture of membranes (PPROM) with unknown onset of labor 08/07/2018  . Preterm labor 07/18/2018  . Amenorrhea 08/25/2017  . Health  care maintenance 07/14/2016  . Vaginal yeast infection 07/14/2016  . Obesity (BMI 35.0-39.9 without comorbidity) 07/14/2016    Past Surgical History:  Procedure Laterality Date  . CESAREAN SECTION N/A 08/07/2018   Procedure: CESAREAN SECTION;  Surgeon: Linda Hedges, DO;  Location: [facility] LD [facility]S;  Service: Obstetrics;  Laterality: N/A;  . WISDOM TOOTH EXTRACTION  2009 approximate    Prior to Admission medications   Medication Sig Start Date End Date Taking? Authorizing Provider  acetaminophen (TYLENOL) 500 MG tablet Take 1,000 mg by mouth every 6 (six) hours as needed for mild pain.    [provider]  alum & mag hydroxide-simeth (MAALOX MAX) 400-400-40 MG/5ML suspension Take 5 mLs by mouth every 6 (six) hours as needed for indigestion. 12/23/18   Rudene Re, MD  atenolol (TENORMIN) 25 MG tablet Take 1 tablet (25 mg total) by mouth daily. 12/16/18   Philemon Kingdom, MD  butalbital-acetaminophen-caffeine (FIORICET) [phone] MG tablet Take by mouth 2 (two) times daily as needed for headache (prn).    [provider]  ibuprofen (ADVIL) 200 MG tablet Take 1,000 mg by mouth every 6 (six) hours as needed for moderate pain.    [provider]  Levonorgestrel (SKYLA) 13.5 MG IUD 1 each by Intrauterine route See admin instructions. Every 3 years    [provider]  methimazole (TAPAZOLE) 10 MG tablet Take 1 tablet (10 mg total) by mouth 2 (two) times daily. 12/20/18 01/19/19  Philemon Kingdom, MD  ondansetron (ZOFRAN ODT) 4 MG disintegrating tablet Take 1 tablet (4 mg total) by mouth every 8 (eight) hours as needed. 12/23/18   Rudene Re, MD    Allergies Patient has no known allergies.  Family History  Problem Relation Age of Onset  . Heart disease Father   . Hypertension Father   . Thyroid disease Father   . Cancer Maternal Grandfather        lung  . Early death Maternal Grandfather   . Alcohol abuse Maternal Grandmother   . COPD Maternal  Grandmother   . Cancer Paternal Grandmother   . Early death Paternal Grandmother   . Kidney disease Paternal Grandmother   . Diabetes Paternal Grandfather     Social History Social History   Tobacco Use  . Smoking status: Never Smoker  . Smokeless tobacco: Never Used  Substance Use Topics  . Alcohol use: No  . Drug use: No    Review of Systems  Constitutional: Negative for fever. Eyes: Negative for visual changes. ENT: Negative for sore throat. Neck: No neck pain  Cardiovascular: + chest pain. Respiratory: Negative for shortness of breath. Gastrointestinal: Negative for abdominal pain. + vomiting and diarrhea. Genitourinary: Negative for dysuria. Musculoskeletal: + upper back pain. Skin: Negative for rash. Neurological: Negative for headaches, weakness or numbness. Psych: No SI or HI  ____________________________________________   PHYSICAL EXAM:  VITAL SIGNS: ED Triage Vitals  Enc Vitals Group     BP 12/23/18 0347 (!) 123/59     Pulse Rate 12/23/18 0337 76     Resp 12/23/18 0337 19     Temp 12/23/18 0337 97.7 F (36.5 C)     Temp Source 12/23/18 0337 Oral     SpO2 12/23/18 0337 100 %     Weight 12/23/18 0339 (!) 315 lb (142.9 kg)     Height 12/23/18 0339 5\' 7"  (1.702 m)     Head Circumference --      Peak Flow --      Pain Score 12/23/18 0339 10     Pain Loc --      Pain Edu? --      Excl. in [place]? --     Constitutional: Alert and oriented, patient looks uncomfortable. HEENT:      Head: Normocephalic and atraumatic.         Eyes: Conjunctivae are normal. Sclera is non-icteric.       Mouth/Throat: Mucous membranes are moist.       Neck: Supple with no signs of meningismus. Cardiovascular: Regular rate and rhythm. No murmurs, gallops, or rubs. 2+ symmetrical distal pulses are present in all extremities. No JVD. Respiratory: Normal respiratory effort. Lungs are clear to auscultation bilaterally. No wheezes, crackles, or rhonchi.  Gastrointestinal: Soft,  non tender, and non distended with positive bowel sounds. No rebound or guarding. Genitourinary: No CVA tenderness. Musculoskeletal: Nontender with normal range of motion in all extremities. No edema, cyanosis, or erythema of extremities. Neurologic: Normal speech and language. Face is symmetric. Moving all extremities. No gross focal neurologic deficits are appreciated. Skin: Skin is warm, dry and intact. No rash noted. Psychiatric: Mood and affect are normal. Speech and behavior are normal.  ____________________________________________   LABS (all labs ordered are listed, but only abnormal results are displayed)  Labs Reviewed  CBC WITH DIFFERENTIAL/PLATELET - Abnormal; Notable for the following components:      Result Value   Hemoglobin 11.8 (*)    All other components within normal limits  COMPREHENSIVE METABOLIC PANEL - Abnormal; Notable for the following components:   Glucose, Bld 109 (*)    ALT 50 (*)    All other components within normal limits  TSH - Abnormal; Notable for the following components:   TSH <0.010 (*)    All other components within normal limits  LIPASE, BLOOD  HCG, QUANTITATIVE, PREGNANCY  T4, FREE  FIBRIN DERIVATIVES D-DIMER ([facility] ONLY)  TROPONIN I (HIGH SENSITIVITY)  TROPONIN I (HIGH SENSITIVITY)   ____________________________________________  EKG  Normal sinus rhythm, rate of 69, normal intervals, normal axis, no ST elevations or depressions.  Normal EKG ____________________________________________  RADIOLOGY  I have personally reviewed the images performed during this visit and I agree with the Radiologist's read.  CXR: negative ____________________________________________   PROCEDURES  Procedure(s) performed: None Procedures Critical Care performed:  None ____________________________________________   INITIAL IMPRESSION / ASSESSMENT AND PLAN / ED COURSE  28 y.o. female with a history of PCOS, recently diagnosed hyperthyroidism who  presents for evaluation of chest pain, vomiting. Also had several episodes of diarrhea today.  Patient looks uncomfortable with normal vital signs, abdomen is obese, soft with no tenderness, lungs are clear to auscultation.  Ddx GERD, peptic ulcer disease, pancreatitis, gallbladder disease, gastroenteritis.  Less likely ACS based on the atypical presentation.  EKG is normal.  Will further stratify by getting troponin.  PE also possible though patient has no tachycardia, no tachypnea, no hypoxia however due to recent pregnancy and currently on an IUD will send a D-dimer.  In the meantime will give IV Pepcid and morphine for pain, check labs and chest x-ray.   Clinical Course as of Dec 23 552  Fri Dec 23, 2018  [id number] Pain resolved after GI cocktail. Patient tolerating PO. Feels back to baseline. Labs with no acute findings. Plan to dc home if repeat trop comes back negative on maalox and zofran.  Discussed follow-up with PCP and my standard return precautions with patient.   [CV]    Clinical Course User Index [CV] Alfred Levins [place], MD     As part of my medical decision making, I reviewed the following data within the [place] History obtained from family, Nursing notes reviewed and incorporated, Labs reviewed , EKG interpreted , Old chart reviewed, Radiograph reviewed , Notes from prior ED visits and [place] Controlled Substance Database   Please note:  Patient was evaluated in Emergency Department today for the symptoms described in the history of present illness. Patient was evaluated in the context of the global COVID-19 pandemic, which necessitated consideration that the patient might be at risk for infection with the SARS-CoV-2 virus that causes COVID-19. Institutional protocols and algorithms that pertain to the evaluation of patients at risk for COVID-19 are in a state of rapid change based on information released by regulatory bodies including the CDC and federal and state  organizations. These policies and algorithms were followed during the patient's care in the ED.  Some ED evaluations and interventions may be delayed as a result of limited staffing during the pandemic.   ____________________________________________   FINAL CLINICAL IMPRESSION(S) / ED DIAGNOSES   Final diagnoses:  Nausea vomiting and diarrhea  Gastroenteritis  Chest pain, unspecified type  Gastroesophageal reflux disease without esophagitis      NEW MEDICATIONS STARTED DURING THIS VISIT:  ED Discharge Orders         Ordered    alum & mag hydroxide-simeth (MAALOX MAX) 400-400-40 MG/5ML suspension  Every 6 hours PRN,   Status:  Discontinued     12/23/18 0627    ondansetron (ZOFRAN ODT) 4 MG disintegrating tablet  Every 8 hours PRN,   Status:  Discontinued     12/23/18 0627    alum & mag hydroxide-simeth (MAALOX MAX) [id number] MG/5ML suspension  Every 6 hours PRN     12/23/18 0658    ondansetron (ZOFRAN ODT) 4 MG disintegrating tablet  Every 8 hours PRN     12/23/18 0658           Note:  This document was prepared using Dragon voice recognition software and may include unintentional dictation errors.    Rudene Re, MD 12/24/18 [phone]

## 2018-12-23 NOTE — ED Notes (Signed)
Patient says medications have made her feel "a bit" better

## 2018-12-23 NOTE — ED Triage Notes (Signed)
Patient arrives from home with epigastric and back pain, lasting several days but particularly worse tonight. Patient diagnosed with hyperthyroid about a week ago, has been taking medication. Patient also endorses diarrhea and nausea, 1 episode emesis today. No known contact with COVID

## 2018-12-26 ENCOUNTER — Other Ambulatory Visit: Payer: Self-pay

## 2018-12-26 ENCOUNTER — Telehealth: Payer: Self-pay | Admitting: Cardiology

## 2018-12-26 ENCOUNTER — Telehealth: Payer: Self-pay

## 2018-12-26 ENCOUNTER — Ambulatory Visit (INDEPENDENT_AMBULATORY_CARE_PROVIDER_SITE_OTHER): Payer: BC Managed Care – PPO | Admitting: Cardiology

## 2018-12-26 ENCOUNTER — Encounter: Payer: Self-pay | Admitting: Cardiology

## 2018-12-26 VITALS — BP 110/80 | HR 60 | Temp 97.2°F | Ht 67.0 in | Wt 322.8 lb

## 2018-12-26 DIAGNOSIS — R0789 Other chest pain: Secondary | ICD-10-CM

## 2018-12-26 MED ORDER — PANTOPRAZOLE SODIUM 40 MG PO TBEC: 40.0000 mg | DELAYED_RELEASE_TABLET | Freq: Two times a day (BID) | ORAL | 5 refills | Status: DC

## 2018-12-26 MED ORDER — PANTOPRAZOLE SODIUM 40 MG PO TBEC: 40.0000 mg | DELAYED_RELEASE_TABLET | Freq: Every day | ORAL | 0 refills | Status: DC

## 2018-12-26 NOTE — Telephone Encounter (Signed)
Returned the patient's call lmtcb if assistance is still needed. 

## 2018-12-26 NOTE — Progress Notes (Signed)
Cardiology Office Note:    Date:  12/26/2018   ID:  Caitlyn Ramos, DOB 06-15-1990, MRN [id number]  PCP:  Esaw Grandchild, NP  Cardiologist:  Kate Sable, MD  Electrophysiologist:  None   Referring MD: Esaw Grandchild, NP   Chief Complaint  Patient presents with  . New Patient (Initial Visit)    Elevated blood pressure/chest pain radiating to back; Meds verbally reviewed with patient.   Caitlyn Ramos is a 28 y.o. female who is being seen today for the evaluation of chest pain at the request of Danford, Berna Spare, NP.     History of Present Illness:    Caitlyn Ramos is a 28 y.o. female with a hx of PCOS, who presents due to chest pain.  Patient states having symptoms of chest pain, hypertension, sweatiness for over 4 weeks now.  Chest pain has no relation with exertion and usually or because while she is asleep waking her up from sleep.  She rates pain as an 8 out of 10 at its worst, and typically last 2 to 3 hours, located in the epigastric area.  Pain has no relation with inspiration or palpation.  She denies any history of cardiac disease, she denies smoking.  She states having increased heart rates, sweatiness, hypertension with blood pressure of 0000000 systolic, pulse rates are about 115 resting.  She was seen in the ED for chest pain about 2 weeks ago. In the ED, high-sensitivity troponins were within normal limits, EKG showed sinus rhythm with no ST changes.  TSH and free T4 revealed hyperthyroidism and patient started on methimazole and advised to follow-up with endocrinology.  She later on follow-up with her primary care provider who started her on atenolol 25 mg twice daily.  She states her heart rate and blood pressure have since normalized.  Her chest pain was deemed secondary to GI etiology, she was prescribed Maalox which has not helped with her chest discomfort.  Past Medical History:  Diagnosis Date  . Abdominal pain   . Cough   . Fibroid   . Generalized headaches   . Nasal  congestion   . Nausea   . PCOS (polycystic ovarian syndrome)   . Sore throat     Past Surgical History:  Procedure Laterality Date  . CESAREAN SECTION N/A 08/07/2018   Procedure: CESAREAN SECTION;  Surgeon: Linda Hedges, DO;  Location: [facility];  Service: Obstetrics;  Laterality: N/A;  . WISDOM TOOTH EXTRACTION  2009 approximate    Current Medications: Current Meds  Medication Sig  . acetaminophen (TYLENOL) 500 MG tablet Take 1,000 mg by mouth every 6 (six) hours as needed for mild pain.  Marland Kitchen alum & mag hydroxide-simeth (MAALOX MAX) 400-400-40 MG/5ML suspension Take 5 mLs by mouth every 6 (six) hours as needed for indigestion.  Marland Kitchen atenolol (TENORMIN) 25 MG tablet Take by mouth 2 (two) times daily.  . butalbital-acetaminophen-caffeine (FIORICET) 50-325-40 MG tablet Take by mouth 2 (two) times daily as needed for headache (prn).  Marland Kitchen ibuprofen (ADVIL) 200 MG tablet Take 1,000 mg by mouth every 6 (six) hours as needed for moderate pain.  . Levonorgestrel (SKYLA) 13.5 MG IUD 1 each by Intrauterine route See admin instructions. Every 3 years  . methimazole (TAPAZOLE) 10 MG tablet Take 1 tablet (10 mg total) by mouth 2 (two) times daily.  . ondansetron (ZOFRAN ODT) 4 MG disintegrating tablet Take 1 tablet (4 mg total) by mouth every 8 (eight) hours as needed.  . [DISCONTINUED] atenolol (TENORMIN)  25 MG tablet Take 1 tablet (25 mg total) by mouth daily. (Patient taking differently: Take 25 mg by mouth 2 (two) times daily. )     Allergies:   Patient has no known allergies.   Social History   Socioeconomic History  . Marital status: Married    Spouse name: [name]  . Number of children: Not on file  . Years of education: Not on file  . Highest education level: Not on file  Occupational History  . Not on file  Social Needs  . Financial resource strain: Not hard at all  . Food insecurity    Worry: Never true    Inability: Never true  . Transportation needs    Medical: No    Non-medical:  No  Tobacco Use  . Smoking status: Never Smoker  . Smokeless tobacco: Never Used  Substance and Sexual Activity  . Alcohol use: No  . Drug use: No  . Sexual activity: Yes    Partners: Male    Birth control/protection: I.U.D.  Lifestyle  . Physical activity    Days per week: Patient refused    Minutes per session: Patient refused  . Stress: Only a little  Relationships  . Social Herbalist on phone: Patient refused    Gets together: Patient refused    Attends religious service: Patient refused    Active member of club or organization: Patient refused    Attends meetings of clubs or organizations: Patient refused    Relationship status: Patient refused  Other Topics Concern  . Not on file  Social History Narrative  . Not on file     Family History: The patient's family history includes Alcohol abuse in her maternal grandmother; COPD in her maternal grandmother; Cancer in her maternal grandfather and paternal grandmother; Diabetes in her paternal grandfather; Early death in her maternal grandfather and paternal grandmother; Heart disease in her father; Hypertension in her father; Kidney disease in her paternal grandmother; Thyroid disease in her father.  CAD/stent in her father age 83s.  ROS:   Please see the history of present illness.     All other systems reviewed and are negative.  EKGs/Labs/Other Studies Reviewed:    The following studies were reviewed today:   EKG:  EKG is  ordered today.  The ekg ordered today demonstrates normal sinus rhythm, normal ECG.  Recent Labs: 12/23/2018: ALT 50; BUN 17; Creatinine, Ser 0.79; Hemoglobin 11.8; Platelets 315; Potassium 3.7; Sodium 137; TSH <0.010  Recent Lipid Panel    Component Value Date/Time   CHOL 128 12/01/2018   CHOL 161 08/04/2016 0847   TRIG 93 12/01/2018   HDL 46 12/01/2018   HDL 52 08/04/2016 0847   CHOLHDL 3.1 08/04/2016 0847   LDLCALC 64 12/01/2018   LDLCALC 101 (H) 08/04/2016 0847    Physical  Exam:    VS:  BP 110/80 (BP Location: Right Arm, Patient Position: Sitting, Cuff Size: Normal)   Pulse 60   Temp (!) 97.2 F (36.2 C)   Ht 5\' 7"  (1.702 m)   Wt (!) 322 lb 12 oz (146.4 kg)   LMP 12/12/2018   SpO2 98%   BMI 50.55 kg/m     Wt Readings from Last 3 Encounters:  12/26/18 (!) 322 lb 12 oz (146.4 kg)  12/23/18 (!) 315 lb (142.9 kg)  12/16/18 (!) 316 lb (143.3 kg)     GEN:  Well nourished, well developed in no acute distress, obese HEENT: Normal NECK: No  JVD; No carotid bruits LYMPHATICS: No lymphadenopathy CARDIAC: RRR, no murmurs, rubs, gallops RESPIRATORY:  Clear to auscultation without rales, wheezing or rhonchi  ABDOMEN: Soft, non-tender, non-distended MUSCULOSKELETAL:  No edema; No deformity  SKIN: Warm and dry NEUROLOGIC:  Alert and oriented x 3 PSYCHIATRIC:  Normal affect   ASSESSMENT:   Patient symptoms are very atypical and not likely of cardiac origin.  She has no cardiac risk factors.  GI etiologies such as GERD is possible.  Her prior elevated blood pressure is likely due to hyperthyroidism.  Blood pressure is now normal with thyroid management. 1. Atypical chest pain    PLAN:    In order of problems listed above:  1. Start Protonix 40 mg twice daily. 2. Continue low calorie diet.  Follow-up in 4 weeks.   Medication Adjustments/Labs and Tests Ordered: Current medicines are reviewed at length with the patient today.  Concerns regarding medicines are outlined above.  Orders Placed This Encounter  Procedures  . EKG 12-Lead   Meds ordered this encounter  Medications  . pantoprazole (PROTONIX) 40 MG tablet    Sig: Take 1 tablet (40 mg total) by mouth 2 (two) times daily.    Dispense:  60 tablet    Refill:  5    Patient Instructions  Medication Instructions:  Your physician has recommended you make the following change in your medication:   START Protonix 40mg  twice daily. An Rx has been sent to your pharmacy.  *If you need a refill on  your cardiac medications before your next appointment, please call your pharmacy*  Lab Work: None ordered If you have labs (blood work) drawn today and your tests are completely normal, you will receive your results only by: Marland Kitchen MyChart Message (if you have MyChart) OR . A paper copy in the mail If you have any lab test that is abnormal or we need to change your treatment, we will call you to review the results.  Testing/Procedures: None ordered  Follow-Up: At [facility], you and your health needs are our priority.  As part of our continuing mission to provide you with exceptional heart care, we have created designated Provider Care Teams.  These Care Teams include your primary Cardiologist (physician) and Advanced Practice Providers (APPs -  Physician Assistants and Nurse Practitioners) who all work together to provide you with the care you need, when you need it.  Your next appointment:   4 week(s)  The format for your next appointment:   In Person  Provider:    You may see Kate Sable, MD or one of the following Advanced Practice Providers on your designated Care Team:    Murray Hodgkins, NP  Christell Faith, PA-C  Marrianne Mood, PA-C   Other Instructions N/A     Signed, Kate Sable, MD  12/26/2018 9:32 AM    [place]

## 2018-12-26 NOTE — Patient Instructions (Signed)
Medication Instructions:  Your physician has recommended you make the following change in your medication:   START Protonix 40mg  twice daily. An Rx has been sent to your pharmacy.  *If you need a refill on your cardiac medications before your next appointment, please call your pharmacy*  Lab Work: None ordered If you have labs (blood work) drawn today and your tests are completely normal, you will receive your results only by: Marland Kitchen MyChart Message (if you have MyChart) OR . A paper copy in the mail If you have any lab test that is abnormal or we need to change your treatment, we will call you to review the results.  Testing/Procedures: None ordered  Follow-Up: At [facility], you and your health needs are our priority.  As part of our continuing mission to provide you with exceptional heart care, we have created designated Provider Care Teams.  These Care Teams include your primary Cardiologist (physician) and Advanced Practice Providers (APPs -  Physician Assistants and Nurse Practitioners) who all work together to provide you with the care you need, when you need it.  Your next appointment:   4 week(s)  The format for your next appointment:   In Person  Provider:    You may see Kate Sable, MD or one of the following Advanced Practice Providers on your designated Care Team:    Murray Hodgkins, NP  Christell Faith, PA-C  Marrianne Mood, PA-C   Other Instructions N/A

## 2018-12-26 NOTE — Telephone Encounter (Signed)
Patient has also sent a message through mychart. Message addressed in [place] message.

## 2018-12-26 NOTE — Telephone Encounter (Signed)
Spoke with the patient. Patient sts that her insurance is requiring a PA for Pantoprazole 40mg  bid. She will need to pay for the medication out of pocket until the PA is approved. Her insurance will cover 40mg  qd. Patient is asking for a 90day supply of Protonix 40mg  written qd but she will take bid, until the PA is completed.  Rx sent to the patient's pharmacy for Protonix 40mg  qd #90.  Patient will take bid. Patient will need a new Rx once the PA for Protonix 40mg  bid is approved.

## 2018-12-26 NOTE — Telephone Encounter (Signed)
Please call to discuss doseage for the new medication that was prescribed today.

## 2018-12-28 ENCOUNTER — Telehealth: Payer: Self-pay | Admitting: *Deleted

## 2018-12-28 NOTE — Telephone Encounter (Signed)
Your information has been submitted to [place]. [employer] [place] will review the request and fax you a determination directly, typically within 3 business days of your submission once all necessary information is received.  If [employer] has not responded in 3 business days or if you have any questions about your submission, contact [place] at [phone].

## 2018-12-28 NOTE — Telephone Encounter (Signed)
Pt needing PA for Pantoprazole 40 mg tablet bid. PA has been submitted through [employer] awaiting approval.

## 2019-01-03 ENCOUNTER — Other Ambulatory Visit: Payer: Self-pay | Admitting: Adult Health

## 2019-01-03 ENCOUNTER — Encounter: Payer: Self-pay | Admitting: Internal Medicine

## 2019-01-03 MED ORDER — FLUCONAZOLE 150 MG PO TABS: ORAL_TABLET | ORAL | 0 refills | Status: DC

## 2019-01-03 NOTE — Telephone Encounter (Signed)
Patient called and left VM wanting an appt with [facility] for a yeast infection, talked to [place] who said she is ok with sending in [place] for patient with no visit needed. Please let Dorothea Ogle know if he needs to communicate this to the patient in response to her VM.

## 2019-01-03 NOTE — Addendum Note (Signed)
Addended by: Mickel Crow on: 01/03/2019 01:11 PM   Modules accepted: Orders

## 2019-01-03 NOTE — Telephone Encounter (Signed)
Patient is aware med will sent to pharmacy. Patient states she normally needs 2 pills to get rid of her yeast infections.   AS, CMA

## 2019-01-25 ENCOUNTER — Encounter (HOSPITAL_COMMUNITY)
Admission: RE | Admit: 2019-01-25 | Discharge: 2019-01-25 | Disposition: A | Discharge status: Home / Self Care | Payer: BC Managed Care – PPO | Source: Ambulatory Visit | Attending: Internal Medicine | Admitting: Internal Medicine

## 2019-01-25 ENCOUNTER — Other Ambulatory Visit: Payer: Self-pay

## 2019-01-25 DIAGNOSIS — E0590 Thyrotoxicosis, unspecified without thyrotoxic crisis or storm: Secondary | ICD-10-CM | POA: Insufficient documentation

## 2019-01-25 MED ORDER — SODIUM IODIDE I-123 7.4 MBQ CAPS
414.0000 | ORAL_CAPSULE | Freq: Once | ORAL | Status: AC
  Administered 2019-01-25: 414 via ORAL

## 2019-01-26 ENCOUNTER — Encounter (HOSPITAL_COMMUNITY)
Admission: RE | Admit: 2019-01-26 | Discharge: 2019-01-26 | Disposition: A | Discharge status: Home / Self Care | Payer: BC Managed Care – PPO | Source: Ambulatory Visit | Attending: Internal Medicine | Admitting: Internal Medicine

## 2019-01-26 DIAGNOSIS — E0590 Thyrotoxicosis, unspecified without thyrotoxic crisis or storm: Secondary | ICD-10-CM | POA: Insufficient documentation

## 2019-01-30 ENCOUNTER — Telehealth: Payer: Self-pay | Admitting: Internal Medicine

## 2019-01-30 ENCOUNTER — Encounter: Payer: Self-pay | Admitting: Internal Medicine

## 2019-01-30 ENCOUNTER — Other Ambulatory Visit: Payer: Self-pay | Admitting: Internal Medicine

## 2019-01-30 MED ORDER — METHIMAZOLE 10 MG PO TABS: 10.0000 mg | ORAL_TABLET | Freq: Every day | ORAL | 2 refills | Status: DC

## 2019-01-30 NOTE — Progress Notes (Deleted)
Cardiology Office Note    Date:  01/30/2019   ID:  Caitlyn Ramos, DOB [birth year], MRN [id number]  PCP:  Caitlyn Grandchild, NP  Cardiologist:  Caitlyn Sable, MD  Electrophysiologist:  None   Chief Complaint: Follow-up  History of Present Illness:   Caitlyn Ramos is a 29 y.o. female with history of recently diagnosed hyperthyroidism on methimazole, PCOS, and HTN who presents for follow-up of chest pain.  Patient was initially evaluated by Dr. Garen Ramos on 12/26/2018 at the request of her PCP for a several week history of chest pain, sweatiness, and elevated BP.  Pain was not related to exertion.  It was noted to occur while she was asleep, waking her up and was rated an 8 out of 10 and will typically last for 2 to 3 hours located in the epigastric region.  It was noted she had been seen in the ED twice in 11/2018 prior to her visit with cardiology for the symptoms with high-sensitivity troponin negative x4 with EKGs showing no acute ST-T changes.  D-dimer normal.  Thyroid studies revealed hyperthyroidism with the patient being started on methimazole and advised to follow-up with endocrinology.  She was started on atenolol by her PCP with improvement and heart rates and BP prior to seeing cardiology.  Patient's chest discomfort was felt to be not likely cardiac in etiology and she was started on Protonix for potential GI etiology.  For elevated BP and heart rate were felt to be in the setting of hyperthyroidism and were noted to be improved.  She has subsequently undergone nuclear medicine thyroid uptake scan which revealed normal radioactive iodine uptake.  ***   Labs independently reviewed: 11/2018 - TSH < 0.010, free T4 improved from 1.73-->1.05, potassium 3.7, BUN 17, serum creatinine 0.79, albumin 3.5, ALT 50, Hgb 11.8, PLT 315, TC 128, TG 93, HDL 46, LDL 64  Past Medical History:  Diagnosis Date  . Abdominal pain   . Cough   . Fibroid   . Generalized headaches   . Nasal congestion     . Nausea   . PCOS (polycystic ovarian syndrome)   . Sore throat     Past Surgical History:  Procedure Laterality Date  . CESAREAN SECTION N/A 08/07/2018   Procedure: CESAREAN SECTION;  Surgeon: Caitlyn Hedges, DO;  Location: [facility] LD ORS;  Service: Obstetrics;  Laterality: N/A;  . WISDOM TOOTH EXTRACTION  2009 approximate    Current Medications: No outpatient medications have been marked as taking for the 02/02/19 encounter (Appointment) with Rise Mu, PA-C.    Allergies:   Patient has no known allergies.   Social History   Socioeconomic History  . Marital status: Married    Spouse name: Caitlyn Ramos  . Number of children: Not on file  . Years of education: Not on file  . Highest education level: Not on file  Occupational History  . Not on file  Tobacco Use  . Smoking status: Never Smoker  . Smokeless tobacco: Never Used  Substance and Sexual Activity  . Alcohol use: No  . Drug use: No  . Sexual activity: Yes    Partners: Male    Birth control/protection: I.U.D.  Other Topics Concern  . Not on file  Social History Narrative  . Not on file   Social Determinants of Health   Financial Resource Strain: Low Risk   . Difficulty of Paying Living Expenses: Not hard at all  Food Insecurity: No Food Insecurity  . Worried About Estate manager/land agent  of Food in the Last Year: Never true  . Ran Out of Food in the Last Year: Never true  Transportation Needs: No Transportation Needs  . Lack of Transportation (Medical): No  . Lack of Transportation (Non-Medical): No  Physical Activity: Unknown  . Days of Exercise per Week: Patient refused  . Minutes of Exercise per Session: Patient refused  Stress: No Stress Concern Present  . Feeling of Stress : Only a little  Social Connections: Unknown  . Frequency of Communication with Friends and Family: Patient refused  . Frequency of Social Gatherings with Friends and Family: Patient refused  . Attends Religious Services: Patient refused  . Active  Member of Clubs or Organizations: Patient refused  . Attends Archivist Meetings: Patient refused  . Marital Status: Patient refused     Family History:  The patient's family history includes Alcohol abuse in her maternal grandmother; COPD in her maternal grandmother; Cancer in her maternal grandfather and paternal grandmother; Diabetes in her paternal grandfather; Early death in her maternal grandfather and paternal grandmother; Heart disease in her father; Hypertension in her father; Kidney disease in her paternal grandmother; Thyroid disease in her father.  ROS:   ROS   EKGs/Labs/Other Studies Reviewed:    Studies reviewed were summarized above. The additional studies were reviewed today: None on file for review.  EKG:  EKG is ordered today.  The EKG ordered today demonstrates ***  Recent Labs: 12/23/2018: ALT 50; BUN 17; Creatinine, Ser 0.79; Hemoglobin 11.8; Platelets 315; Potassium 3.7; Sodium 137; TSH <0.010  Recent Lipid Panel    Component Value Date/Time   CHOL 128 12/01/2018 0000   CHOL 161 08/04/2016 0847   TRIG 93 12/01/2018 0000   HDL 46 12/01/2018 0000   HDL 52 08/04/2016 0847   CHOLHDL 3.1 08/04/2016 0847   LDLCALC 64 12/01/2018 0000   LDLCALC 101 (H) 08/04/2016 0847    PHYSICAL EXAM:    VS:  There were no vitals taken for this visit.  BMI: There is no height or weight on file to calculate BMI.  Physical Exam  Wt Readings from Last 3 Encounters:  12/26/18 (!) 322 lb 12 oz (146.4 kg)  12/23/18 (!) 315 lb (142.9 kg)  12/16/18 (!) 316 lb (143.3 kg)     ASSESSMENT & PLAN:   1. ***  Disposition: F/u with Dr. Garen Ramos or an APP in ***.   Medication Adjustments/Labs and Tests Ordered: Current medicines are reviewed at length with the patient today.  Concerns regarding medicines are outlined above. Medication changes, Labs and Tests ordered today are summarized above and listed in the Patient Instructions accessible in Encounters.    Signed, Caitlyn Faith, PA-C 01/30/2019 11:42 AM     [facility] [address] [place] [address] [place], [place] [address] [phone]

## 2019-01-30 NOTE — Telephone Encounter (Signed)
Per patient's request, I called her to explain her thyroid uptake and scan results.  These were normal, with the uptake at the lower limit of normal.  I explained that these may be consistent with resolving thyroiditis, but it is not clear. She was off methimazole 10 mg twice a day for 7 days before the test.  She did not resume it after her test 3 days ago.  I advised her to restart it at 10 mg daily.  She is also off atenolol but tells me that she does not have palpitations and her pulse at rest is in the 80s.  For now, we will continue off atenolol.  We will plan to recheck her thyroid tests when she comes back towards the end of next month but I advised her to let me know if she has any thyrotoxic symptoms, in which case we will need to check her labs sooner and adjust her methimazole dose stopped.

## 2019-02-02 ENCOUNTER — Ambulatory Visit: Payer: BC Managed Care – PPO | Admitting: Physician Assistant

## 2019-03-08 DIAGNOSIS — S46811A Strain of other muscles, fascia and tendons at shoulder and upper arm level, right arm, initial encounter: Secondary | ICD-10-CM | POA: Diagnosis not present

## 2019-03-08 DIAGNOSIS — M9902 Segmental and somatic dysfunction of thoracic region: Secondary | ICD-10-CM | POA: Diagnosis not present

## 2019-03-08 DIAGNOSIS — M9901 Segmental and somatic dysfunction of cervical region: Secondary | ICD-10-CM | POA: Diagnosis not present

## 2019-03-08 DIAGNOSIS — M7912 Myalgia of auxiliary muscles, head and neck: Secondary | ICD-10-CM | POA: Diagnosis not present

## 2019-03-14 DIAGNOSIS — S46811A Strain of other muscles, fascia and tendons at shoulder and upper arm level, right arm, initial encounter: Secondary | ICD-10-CM | POA: Diagnosis not present

## 2019-03-14 DIAGNOSIS — M9901 Segmental and somatic dysfunction of cervical region: Secondary | ICD-10-CM | POA: Diagnosis not present

## 2019-03-14 DIAGNOSIS — M7912 Myalgia of auxiliary muscles, head and neck: Secondary | ICD-10-CM | POA: Diagnosis not present

## 2019-03-14 DIAGNOSIS — M9902 Segmental and somatic dysfunction of thoracic region: Secondary | ICD-10-CM | POA: Diagnosis not present

## 2019-03-21 ENCOUNTER — Other Ambulatory Visit: Payer: Self-pay

## 2019-03-23 ENCOUNTER — Encounter: Payer: Self-pay | Admitting: Internal Medicine

## 2019-03-23 ENCOUNTER — Ambulatory Visit: Payer: BC Managed Care – PPO | Admitting: Internal Medicine

## 2019-03-23 ENCOUNTER — Other Ambulatory Visit: Payer: Self-pay

## 2019-03-23 VITALS — BP 120/82 | HR 90 | Ht 67.0 in | Wt 323.0 lb

## 2019-03-23 DIAGNOSIS — E0590 Thyrotoxicosis, unspecified without thyrotoxic crisis or storm: Secondary | ICD-10-CM | POA: Diagnosis not present

## 2019-03-23 LAB — T4, FREE: Free T4: 1.01 ng/dL (ref 0.60–1.60)

## 2019-03-23 LAB — TSH: TSH: 0.06 u[IU]/mL — ABNORMAL LOW (ref 0.35–4.50)

## 2019-03-23 LAB — T3, FREE: T3, Free: 5.5 pg/mL — ABNORMAL HIGH (ref 2.3–4.2)

## 2019-03-23 MED ORDER — METHIMAZOLE 5 MG PO TABS: 5.0000 mg | ORAL_TABLET | Freq: Every day | ORAL | 3 refills | Status: DC

## 2019-03-23 NOTE — Progress Notes (Signed)
Patient ID: Caitlyn Ramos, female   DOB: 1990/03/23, 29 y.o.   MRN: [id number]   This visit occurred during the SARS-CoV-2 public health emergency.  Safety protocols were in place, including screening questions prior to the visit, additional usage of staff PPE, and extensive cleaning of exam room while observing appropriate contact time as indicated for disinfecting solutions.   HPI  Caitlyn Ramos is a 29 y.o.-year-old female, initially referred by her PCP, [name], NP, for evaluation and management of thyrotoxicosis.  Last visit 3 months ago.  Reviewed and addended history: Patient describes that she gave birth to premature twins (at 49 weeks) on 08/25/2018.  They were 2 months in the hospital, but now they are home and doing well.  After this, she developed heat intolerance and significant sweating >> had labs drawn >> thyrotoxicosis.  She then presented to the ED with chest pain, heat intolerance, double vision, and tachycardia on 12/12/2018 and she was found to be thyrotoxic.  She was started on methimazole 10 mg 3x a day.  She continues this today.  She was referred to endocrinology for further investigation and management.  At our first visit, in 11/2018, we checked her TFTs and they were thyrotoxic, with.  We decrease methimazole to 10 mg twice a day.  Started atenolol 25 mg daily.  She came off since then.  Her TSI's were not elevated.  I ordered a thyroid uptake and scan (01/27/2019): Low normal uptake and normal scan: Normal appearance of the thyroid gland. No hot or cold nodules identified. 4 hour I-123 uptake = 5.8% (normal 5-20%) 24 hour I-123 uptake = 16.2% (normal 10-30%) IMPRESSION: Normal radioactive iodine uptake and scan.  I explained that this means either resolved thyroiditis or mild Graves' disease.  She came off methimazole before the scan and did not restart it afterwards until we discussed about the results.  At that time, I advised her to restart 10 mg daily. She  did not do so... She is off the medication.  Reviewed patient's TFTs: Lab Results  Component Value Date   TSH <0.010 (L) 12/23/2018   TSH <0.01 (L) 12/16/2018   TSH <0.010 (L) 12/12/2018   TSH 0.01 (A) 12/01/2018   TSH 2.290 08/04/2016   FREET4 1.05 12/23/2018   FREET4 1.73 (H) 12/16/2018   FREET4 2.18 (H) 12/12/2018   T3FREE 6.6 (H) 12/16/2018   Her Graves' antibodies were not elevated: Lab Results  Component Value Date   TSI <89 12/16/2018   At this visit, she has no complaints.  Pt denies: - feeling nodules in neck - hoarseness - dysphagia - choking - SOB with lying down  Patient lost 125 pounds, intentionally, before last pregnancy by changing her diet and working out with a trainer.  She gained 80 pounds during the pregnancy.  Pt does not have a FH of thyroid ds. No FH of thyroid cancer. No h/o radiation tx to head or neck.  No seaweed or kelp. No recent contrast studies. No herbal supplements. No Biotin use. No recent steroids use.   Pt. also has a history of father, PGF, P aunts - all hypothyroidism.  ROS: Constitutional: no weight gain/no weight loss, no fatigue, No subjective hyperthermia, no subjective hypothermia Eyes: no blurry vision, no xerophthalmia ENT: no sore throat, + see HPI Cardiovascular: no CP/no SOB/no palpitations/no leg swelling Respiratory: no cough/no SOB/no wheezing Gastrointestinal: no N/no V/no D/no C/no acid reflux Musculoskeletal: no muscle aches/no joint aches Skin: no rashes, no hair loss Neurological: No  tremors/no numbness/no tingling/no dizziness  I reviewed pt's medications, allergies, PMH, social hx, family hx, and changes were documented in the history of present illness. Otherwise, unchanged from my initial visit note.  Past Medical History:  Diagnosis Date  . Abdominal pain   . Cough   . Fibroid   . Generalized headaches   . Nasal congestion   . Nausea   . PCOS (polycystic ovarian syndrome)   . Sore throat    Past  Surgical History:  Procedure Laterality Date  . CESAREAN SECTION N/A 08/07/2018   Procedure: CESAREAN SECTION;  Surgeon: Linda Hedges, DO;  Location: [facility]S;  Service: Obstetrics;  Laterality: N/A;  . WISDOM TOOTH EXTRACTION  2009 approximate   Social History   Socioeconomic History  . Marital status: Married    Spouse name: [name]  . Number of children: 2  . Years of education: Not on file  . Highest education level: Not on file  Occupational History  .  Stay-at-home mom  Social Needs  . Financial resource strain: Not hard at all  . Food insecurity    Worry: Never true    Inability: Never true  . Transportation needs    Medical: No    Non-medical: No  Tobacco Use  . Smoking status: Never Smoker  . Smokeless tobacco: Never Used  Substance and Sexual Activity  . Alcohol use: No  . Drug use: No  . Sexual activity: Yes    Partners: Male   Current Outpatient Medications on File Prior to Visit  Medication Sig Dispense Refill  . acetaminophen (TYLENOL) 500 MG tablet Take 1,000 mg by mouth every 6 (six) hours as needed for mild pain.    Marland Kitchen alum & mag hydroxide-simeth (MAALOX MAX) 400-400-40 MG/5ML suspension Take 5 mLs by mouth every 6 (six) hours as needed for indigestion. 355 mL 0  . butalbital-acetaminophen-caffeine (FIORICET) 50-325-40 MG tablet Take by mouth 2 (two) times daily as needed for headache (prn).    . fluconazole (DIFLUCAN) 150 MG tablet Take one tablet by mouth- may repeat dose in 7 days if symptoms persist 2 tablet 0  . ibuprofen (ADVIL) 200 MG tablet Take 1,000 mg by mouth every 6 (six) hours as needed for moderate pain.    . Levonorgestrel (SKYLA) 13.5 MG IUD 1 each by Intrauterine route See admin instructions. Every 3 years    . methimazole (TAPAZOLE) 10 MG tablet Take 1 tablet (10 mg total) by mouth daily. 60 tablet 2  . ondansetron (ZOFRAN ODT) 4 MG disintegrating tablet Take 1 tablet (4 mg total) by mouth every 8 (eight) hours as needed. 20 tablet 0  .  pantoprazole (PROTONIX) 40 MG tablet Take 1 tablet (40 mg total) by mouth daily. 90 tablet 0   No current facility-administered medications on file prior to visit.   No Known Allergies Family History  Problem Relation Age of Onset  . Heart disease Father   . Hypertension Father   . Thyroid disease Father   . Cancer Maternal Grandfather        lung  . Early death Maternal Grandfather   . Alcohol abuse Maternal Grandmother   . COPD Maternal Grandmother   . Cancer Paternal Grandmother   . Early death Paternal Grandmother   . Kidney disease Paternal Grandmother   . Diabetes Paternal Grandfather     PE: BP 120/82   Pulse 90   Ht 5\' 7"  (1.702 m)   Wt (!) 323 lb (146.5 kg)   SpO2  98%   BMI 50.59 kg/m  Wt Readings from Last 3 Encounters:  03/23/19 (!) 323 lb (146.5 kg)  12/26/18 (!) 322 lb 12 oz (146.4 kg)  12/23/18 (!) 315 lb (142.9 kg)   Constitutional: overweight, in NAD Eyes: PERRLA, EOMI, no exophthalmos ENT: moist mucous membranes, no thyromegaly, no cervical lymphadenopathy Cardiovascular: RRR, No MRG Respiratory: CTA B Gastrointestinal: abdomen soft, NT, ND, BS+ Musculoskeletal: no deformities, strength intact in all 4 Skin: moist, warm, no rashes Neurological: no tremor with outstretched hands, DTR normal in all 4  ASSESSMENT: 1. Thyrotoxicosis  PLAN:  1. Patient with relatively recent thyrotoxicosis, with thyrotoxic symptoms: Heat intolerance, hyper defecation, chest pain, tremors, now improved.  At last visit, we checked her TFTs and they were improved on methimazole 10 mg 3 times a day.  Therefore, I advised her to decrease the dose to 10 mg twice a day.  We also checked TSI antibodies which were not elevated, so we did not have a clear diagnosis of Graves' disease.  Therefore, I sent her for a thyroid uptake and scan.  This was performed on 01/27/2019.  This showed normal uptake and scan, consistent with probably resolving thyroiditis versus mild Graves'  disease. -At that time, I advised her to resume methimazole 10 mg daily, but she did not do so since her thyroid uptake and scan was normal and she felt well.  She continues to have no complaints today.  Her pulse at home is between 75 and 80. She is now off atenolol.  Pulse on arrival was 90, but she probably rushed over here.  I advised her to check her pulse at home and if this is higher than 90 at rest, we may need to restart atenolol 20 mg daily. Her heat intolerance resolved, as did her tremors. -No signs of Graves' ophthalmopathy: No double vision, blurry vision, eye pain, chemosis.  She had double vision when she drove to the emergency room at her diagnosis of hyperthyroidism but this resolved completely.  She does have an ophthalmologist. -At this visit, we will recheck her TFTs and we discussed that we may need to restart methimazole -I will see her back in 6 months  Patient Instructions  Please continue off Methimazole  Please stop at the lab.  Please come back for a follow-up appointment in 6 months, but labs in 3 months.  Component     Latest Ref Rng & Units 03/23/2019  TSH     0.35 - 4.50 uIU/mL 0.06 (L)  T4,Free(Direct)     0.60 - 1.60 ng/dL 1.01  Triiodothyronine,Free,Serum     2.3 - 4.2 pg/mL 5.5 (H)  Her thyroid tests are slightly better but they are still thyrotoxic.  This points more towards a diagnosis of mild Graves' disease.  We will restart a low-dose methimazole, of 5 mg daily and recheck her tests in 1.5 months.  Philemon Kingdom, MD PhD [facility] Endocrinology

## 2019-03-23 NOTE — Patient Instructions (Addendum)
Patient Instructions  Please continue off Methimazole  Please stop at the lab.  Please come back for a follow-up appointment in 6 months, but labs in 3 months.

## 2019-04-11 DIAGNOSIS — M9905 Segmental and somatic dysfunction of pelvic region: Secondary | ICD-10-CM | POA: Diagnosis not present

## 2019-04-11 DIAGNOSIS — M25561 Pain in right knee: Secondary | ICD-10-CM | POA: Diagnosis not present

## 2019-04-11 DIAGNOSIS — M9904 Segmental and somatic dysfunction of sacral region: Secondary | ICD-10-CM | POA: Diagnosis not present

## 2019-04-11 DIAGNOSIS — M9903 Segmental and somatic dysfunction of lumbar region: Secondary | ICD-10-CM | POA: Diagnosis not present

## 2019-04-17 DIAGNOSIS — M9903 Segmental and somatic dysfunction of lumbar region: Secondary | ICD-10-CM | POA: Diagnosis not present

## 2019-04-17 DIAGNOSIS — M9904 Segmental and somatic dysfunction of sacral region: Secondary | ICD-10-CM | POA: Diagnosis not present

## 2019-04-17 DIAGNOSIS — M9905 Segmental and somatic dysfunction of pelvic region: Secondary | ICD-10-CM | POA: Diagnosis not present

## 2019-04-17 DIAGNOSIS — M25561 Pain in right knee: Secondary | ICD-10-CM | POA: Diagnosis not present

## 2019-05-05 ENCOUNTER — Other Ambulatory Visit: Payer: Self-pay

## 2019-05-09 ENCOUNTER — Other Ambulatory Visit: Payer: Self-pay | Admitting: Internal Medicine

## 2019-05-09 ENCOUNTER — Ambulatory Visit: Payer: BC Managed Care – PPO | Admitting: Internal Medicine

## 2019-05-09 ENCOUNTER — Other Ambulatory Visit: Payer: Self-pay

## 2019-05-09 ENCOUNTER — Other Ambulatory Visit (INDEPENDENT_AMBULATORY_CARE_PROVIDER_SITE_OTHER): Payer: BC Managed Care – PPO

## 2019-05-09 DIAGNOSIS — E0590 Thyrotoxicosis, unspecified without thyrotoxic crisis or storm: Secondary | ICD-10-CM

## 2019-05-09 LAB — T4, FREE: Free T4: 0.69 ng/dL (ref 0.60–1.60)

## 2019-05-09 LAB — TSH: TSH: 1.7 u[IU]/mL (ref 0.35–4.50)

## 2019-05-09 LAB — T3, FREE: T3, Free: 3.9 pg/mL (ref 2.3–4.2)

## 2019-06-06 ENCOUNTER — Telehealth: Payer: Self-pay | Admitting: Physician Assistant

## 2019-06-06 NOTE — Telephone Encounter (Signed)
Patient has not been seen since 01/03/2018. Per Herb Grays we are unable to send in medication for patient without her being seen for issue first.   Patient is aware of this and verbalized understanding. I advised patient she should call OBGYN since recently treated by them. Patient states she already has a call put in for them. AS, CMA

## 2019-06-06 NOTE — Telephone Encounter (Signed)
Patient called states she thinks has a yeast infection & request Rx be called into local pharmacy--advised a Telehealth may be required but pt states just has new twins & inconvenient for her to come to office, can't an Rx be called in-- attempted to txf her to med asst was unavailable with a pt--- advised pt forwarding her request & CMA would call her back w/response@ [phone].  --glh

## 2019-06-19 DIAGNOSIS — M9901 Segmental and somatic dysfunction of cervical region: Secondary | ICD-10-CM | POA: Diagnosis not present

## 2019-06-19 DIAGNOSIS — M9902 Segmental and somatic dysfunction of thoracic region: Secondary | ICD-10-CM | POA: Diagnosis not present

## 2019-06-19 DIAGNOSIS — M25661 Stiffness of right knee, not elsewhere classified: Secondary | ICD-10-CM | POA: Diagnosis not present

## 2019-06-19 DIAGNOSIS — M7541 Impingement syndrome of right shoulder: Secondary | ICD-10-CM | POA: Diagnosis not present

## 2019-06-28 DIAGNOSIS — M7541 Impingement syndrome of right shoulder: Secondary | ICD-10-CM | POA: Diagnosis not present

## 2019-06-28 DIAGNOSIS — M25661 Stiffness of right knee, not elsewhere classified: Secondary | ICD-10-CM | POA: Diagnosis not present

## 2019-06-28 DIAGNOSIS — M9902 Segmental and somatic dysfunction of thoracic region: Secondary | ICD-10-CM | POA: Diagnosis not present

## 2019-06-28 DIAGNOSIS — M9901 Segmental and somatic dysfunction of cervical region: Secondary | ICD-10-CM | POA: Diagnosis not present

## 2019-06-30 ENCOUNTER — Other Ambulatory Visit (INDEPENDENT_AMBULATORY_CARE_PROVIDER_SITE_OTHER): Payer: BC Managed Care – PPO

## 2019-06-30 ENCOUNTER — Other Ambulatory Visit: Payer: Self-pay

## 2019-06-30 DIAGNOSIS — E0590 Thyrotoxicosis, unspecified without thyrotoxic crisis or storm: Secondary | ICD-10-CM

## 2019-06-30 LAB — TSH: TSH: 1.95 u[IU]/mL (ref 0.35–4.50)

## 2019-06-30 LAB — T3, FREE: T3, Free: 3.9 pg/mL (ref 2.3–4.2)

## 2019-06-30 LAB — T4, FREE: Free T4: 0.97 ng/dL (ref 0.60–1.60)

## 2019-08-15 DIAGNOSIS — M9901 Segmental and somatic dysfunction of cervical region: Secondary | ICD-10-CM | POA: Diagnosis not present

## 2019-08-15 DIAGNOSIS — M25661 Stiffness of right knee, not elsewhere classified: Secondary | ICD-10-CM | POA: Diagnosis not present

## 2019-08-15 DIAGNOSIS — M7541 Impingement syndrome of right shoulder: Secondary | ICD-10-CM | POA: Diagnosis not present

## 2019-08-15 DIAGNOSIS — M9902 Segmental and somatic dysfunction of thoracic region: Secondary | ICD-10-CM | POA: Diagnosis not present

## 2019-08-18 DIAGNOSIS — M7541 Impingement syndrome of right shoulder: Secondary | ICD-10-CM | POA: Diagnosis not present

## 2019-08-18 DIAGNOSIS — M9901 Segmental and somatic dysfunction of cervical region: Secondary | ICD-10-CM | POA: Diagnosis not present

## 2019-08-18 DIAGNOSIS — M25661 Stiffness of right knee, not elsewhere classified: Secondary | ICD-10-CM | POA: Diagnosis not present

## 2019-08-18 DIAGNOSIS — M9902 Segmental and somatic dysfunction of thoracic region: Secondary | ICD-10-CM | POA: Diagnosis not present

## 2019-08-24 ENCOUNTER — Encounter: Payer: Self-pay | Admitting: Emergency Medicine

## 2019-08-24 ENCOUNTER — Ambulatory Visit
Admission: EM | Admit: 2019-08-24 | Discharge: 2019-08-24 | Disposition: A | Discharge status: Home / Self Care | Payer: BC Managed Care – PPO

## 2019-08-24 ENCOUNTER — Other Ambulatory Visit: Payer: Self-pay

## 2019-08-24 DIAGNOSIS — R0981 Nasal congestion: Secondary | ICD-10-CM

## 2019-08-24 DIAGNOSIS — R0982 Postnasal drip: Secondary | ICD-10-CM

## 2019-08-24 DIAGNOSIS — J3489 Other specified disorders of nose and nasal sinuses: Secondary | ICD-10-CM

## 2019-08-24 MED ORDER — DEXAMETHASONE SODIUM PHOSPHATE 10 MG/ML IJ SOLN
10.0000 mg | Freq: Once | INTRAMUSCULAR | Status: AC
  Administered 2019-08-24: 10 mg via INTRAMUSCULAR

## 2019-08-24 MED ORDER — IPRATROPIUM BROMIDE 0.06 % NA SOLN: 2.0000 | Freq: Four times a day (QID) | NASAL | 0 refills | Status: DC

## 2019-08-24 NOTE — ED Provider Notes (Signed)
[facility]    CSN: [id number] Arrival date & time: 08/24/19  1752      History   Chief Complaint Chief Complaint  Patient presents with  . URI    HPI Caitlyn Ramos is a 29 y.o. female.   29 year old female comes in for 3 day of URI symptoms. Sinus pressure, ear pressure, cough, post nasal drip. Denies fever, chills, body aches. Denies abdominal pain, nausea, vomiting, diarrhea. Denies shortness of breath, loss of taste/smell. Not COVID vaccinated.      Past Medical History:  Diagnosis Date  . Abdominal pain   . Cough   . Fibroid   . Generalized headaches   . Nasal congestion   . Nausea   . PCOS (polycystic ovarian syndrome)   . Sore throat     Patient Active Problem List   Diagnosis Date Noted  . Thyrotoxicosis without thyroid storm 12/16/2018  . S/P cesarean section 08/08/2018  . Preterm premature rupture of membranes (PPROM) with unknown onset of labor 08/07/2018  . Preterm labor 07/18/2018  . Amenorrhea 08/25/2017  . Health care maintenance 07/14/2016  . Vaginal yeast infection 07/14/2016  . Obesity (BMI 35.0-39.9 without comorbidity) 07/14/2016    Past Surgical History:  Procedure Laterality Date  . CESAREAN SECTION N/A 08/07/2018   Procedure: CESAREAN SECTION;  Surgeon: Linda Hedges, DO;  Location: [facility] LD ORS;  Service: Obstetrics;  Laterality: N/A;  . WISDOM TOOTH EXTRACTION  2009 approximate    OB History    Gravida  2   Para  1   Term      Preterm  1   AB  1   Living  2     SAB  1   TAB      Ectopic      Multiple  1   Live Births  2            Home Medications    Prior to Admission medications   Medication Sig Start Date End Date Taking? Authorizing Provider  phentermine 30 MG capsule Take 30 mg by mouth every morning.   Yes [provider]  acetaminophen (TYLENOL) 500 MG tablet Take 1,000 mg by mouth every 6 (six) hours as needed for mild pain.    [provider]  alum & mag  hydroxide-simeth (MAALOX MAX) 400-400-40 MG/5ML suspension Take 5 mLs by mouth every 6 (six) hours as needed for indigestion. 12/23/18   Rudene Re, MD  ibuprofen (ADVIL) 200 MG tablet Take 1,000 mg by mouth every 6 (six) hours as needed for moderate pain.    [provider]  ipratropium (ATROVENT) 0.06 % nasal spray Place 2 sprays into both nostrils 4 (four) times daily. 08/24/19   Ok Edwards, PA-C  Levonorgestrel (SKYLA) 13.5 MG IUD 1 each by Intrauterine route See admin instructions. Every 3 years    [provider]  ondansetron (ZOFRAN ODT) 4 MG disintegrating tablet Take 1 tablet (4 mg total) by mouth every 8 (eight) hours as needed. 12/23/18   Rudene Re, MD  pantoprazole (PROTONIX) 40 MG tablet Take 1 tablet (40 mg total) by mouth daily. 12/26/18   Kate Sable, MD  methimazole (TAPAZOLE) 5 MG tablet Take 1 tablet (5 mg total) by mouth daily. 03/23/19 08/24/19  Philemon Kingdom, MD    Family History Family History  Problem Relation Age of Onset  . Heart disease Father   . Hypertension Father   . Thyroid disease Father   . Cancer Maternal  Grandfather        lung  . Early death Maternal Grandfather   . Alcohol abuse Maternal Grandmother   . COPD Maternal Grandmother   . Cancer Paternal Grandmother   . Early death Paternal Grandmother   . Kidney disease Paternal Grandmother   . Diabetes Paternal Grandfather     Social History Social History   Tobacco Use  . Smoking status: Never Smoker  . Smokeless tobacco: Never Used  Vaping Use  . Vaping Use: Never used  Substance Use Topics  . Alcohol use: No  . Drug use: No     Allergies   Patient has no known allergies.   Review of Systems Review of Systems  Reason unable to perform ROS: See HPI as above.     Physical Exam Triage Vital Signs ED Triage Vitals  Enc Vitals Group     BP 08/24/19 1816 (!) 135/83     Pulse Rate 08/24/19 1816 95     Resp 08/24/19 1816 16     Temp 08/24/19  1816 98.5 F (36.9 C)     Temp Source 08/24/19 1816 Oral     SpO2 08/24/19 1816 97 %     Weight --      Height --      Head Circumference --      Peak Flow --      Pain Score 08/24/19 1819 8     Pain Loc --      Pain Edu? --      Excl. in [place]? --    No data found.  Updated Vital Signs BP (!) 135/83 (BP Location: Left Arm)   Pulse 95   Temp 98.5 F (36.9 C) (Oral)   Resp 16   LMP 08/12/2019   SpO2 97%   Visual Acuity Right Eye Distance:   Left Eye Distance:   Bilateral Distance:    Right Eye Near:   Left Eye Near:    Bilateral Near:     Physical Exam Constitutional:      General: She is not in acute distress.    Appearance: Normal appearance. She is well-developed. She is not ill-appearing, toxic-appearing or diaphoretic.  HENT:     Head: Normocephalic and atraumatic.     Right Ear: Tympanic membrane, ear canal and external ear normal. Tympanic membrane is not erythematous or bulging.     Left Ear: Tympanic membrane, ear canal and external ear normal. Tympanic membrane is not erythematous or bulging.     Nose:     Right Sinus: No maxillary sinus tenderness or frontal sinus tenderness.     Left Sinus: No maxillary sinus tenderness or frontal sinus tenderness.     Mouth/Throat:     Mouth: Mucous membranes are moist.     Pharynx: Oropharynx is clear. Uvula midline.  Eyes:     Conjunctiva/sclera: Conjunctivae normal.     Pupils: Pupils are equal, round, and reactive to light.  Cardiovascular:     Rate and Rhythm: Normal rate and regular rhythm.  Pulmonary:     Effort: Pulmonary effort is normal. No accessory muscle usage, prolonged expiration, respiratory distress or retractions.     Breath sounds: No decreased air movement or transmitted upper airway sounds. No decreased breath sounds.     Comments: LCTAB Musculoskeletal:     Cervical back: Normal range of motion and neck supple.  Skin:    General: Skin is warm and dry.  Neurological:     Mental Status: She is  alert and oriented to person, place, and time.      UC Treatments / Results  Labs (all labs ordered are listed, but only abnormal results are displayed) Labs Reviewed  NOVEL CORONAVIRUS, NAA    EKG   Radiology No results found.  Procedures Procedures (including critical care time)  Medications Ordered in UC Medications  dexamethasone (DECADRON) injection 10 mg (has no administration in time range)    Initial Impression / Assessment and Plan / UC Course  I have reviewed the triage vital signs and the nursing notes.  Pertinent labs & imaging results that were available during my care of the patient were reviewed by me and considered in my medical decision making (see chart for details).    COVID PCR test ordered. Patient to quarantine until testing results return. No alarming signs on exam.  Patient speaking in full sentences without respiratory distress. LCTAB. Decadron injection for sinus pressure as patient preferred over oral corticosteroid. Other symptomatic treatment discussed.  Push fluids.  Return precautions given.  Patient expresses understanding and agrees to plan.  Final Clinical Impressions(s) / UC Diagnoses   Final diagnoses:  Sinus pressure  Nasal congestion  Post-nasal drip   ED Prescriptions    Medication Sig Dispense Auth. Provider   ipratropium (ATROVENT) 0.06 % nasal spray Place 2 sprays into both nostrils 4 (four) times daily. 15 mL Ok Edwards, PA-C     PDMP not reviewed this encounter.   Ok Edwards, PA-C 08/24/19 1835

## 2019-08-24 NOTE — ED Notes (Signed)
Patient able to ambulate independently  

## 2019-08-24 NOTE — Discharge Instructions (Signed)
COVID PCR testing ordered. I would like you to quarantine until testing results. Decadron injection in office today. You can take over the counter flonase/nasacort to help with nasal congestion/drainage. If needed, add on atrovent nasal spray. Tylenol/motrin for pain and fever. Keep hydrated, urine should be clear to pale yellow in color. If experiencing shortness of breath, trouble breathing, go to the emergency department for further evaluation needed.

## 2019-08-24 NOTE — ED Triage Notes (Signed)
Pt presents to [facility] for assessment of 3 days of sinus congestion, sinus pressure, ear pressure, cough, post-nasal drip.

## 2019-08-27 LAB — NOVEL CORONAVIRUS, NAA: SARS-CoV-2, NAA: NOT DETECTED

## 2019-08-30 ENCOUNTER — Ambulatory Visit
Admission: EM | Admit: 2019-08-30 | Discharge: 2019-08-30 | Disposition: A | Discharge status: Home / Self Care | Payer: BC Managed Care – PPO | Attending: Emergency Medicine | Admitting: Emergency Medicine

## 2019-08-30 ENCOUNTER — Other Ambulatory Visit: Payer: Self-pay

## 2019-08-30 DIAGNOSIS — J0100 Acute maxillary sinusitis, unspecified: Secondary | ICD-10-CM

## 2019-08-30 MED ORDER — AMOXICILLIN-POT CLAVULANATE 875-125 MG PO TABS: 1.0000 | ORAL_TABLET | Freq: Two times a day (BID) | ORAL | 0 refills | Status: DC

## 2019-08-30 NOTE — ED Triage Notes (Signed)
Pt c/o sinus pressure/headache and nasal congestion x9 days. States seen here 5 days ago and tx'd with no relief. States had a neg covid test

## 2019-08-30 NOTE — ED Provider Notes (Signed)
[facility]    CSN: [id number] Arrival date & time: 08/30/19  1021      History   Chief Complaint Chief Complaint  Patient presents with  . Facial Pain    HPI Caitlyn Ramos is a 29 y.o. female presenting for worsening sinus congestion and pressure.  Patient previously evaluated 7/29: Please see those records reviewed by me at time of visit.  Has been compliant with supportive care.  Now endorsing dental pain.  No fever, cough, difficulty breathing.  Covid negative.   Past Medical History:  Diagnosis Date  . Abdominal pain   . Cough   . Fibroid   . Generalized headaches   . Nasal congestion   . Nausea   . PCOS (polycystic ovarian syndrome)   . Sore throat     Patient Active Problem List   Diagnosis Date Noted  . Thyrotoxicosis without thyroid storm 12/16/2018  . S/P cesarean section 08/08/2018  . Preterm premature rupture of membranes (PPROM) with unknown onset of labor 08/07/2018  . Preterm labor 07/18/2018  . Amenorrhea 08/25/2017  . Health care maintenance 07/14/2016  . Vaginal yeast infection 07/14/2016  . Obesity (BMI 35.0-39.9 without comorbidity) 07/14/2016    Past Surgical History:  Procedure Laterality Date  . CESAREAN SECTION N/A 08/07/2018   Procedure: CESAREAN SECTION;  Surgeon: Linda Hedges, DO;  Location: [facility];  Service: Obstetrics;  Laterality: N/A;  . WISDOM TOOTH EXTRACTION  2009 approximate    OB History    Gravida  2   Para  1   Term      Preterm  1   AB  1   Living  2     SAB  1   TAB      Ectopic      Multiple  1   Live Births  2            Home Medications    Prior to Admission medications   Medication Sig Start Date End Date Taking? Authorizing Provider  acetaminophen (TYLENOL) 500 MG tablet Take 1,000 mg by mouth every 6 (six) hours as needed for mild pain.    [provider]  alum & mag hydroxide-simeth (MAALOX MAX) 400-400-40 MG/5ML suspension Take 5 mLs by mouth every 6 (six) hours  as needed for indigestion. 12/23/18   Rudene Re, MD  amoxicillin-clavulanate (AUGMENTIN) 875-125 MG tablet Take 1 tablet by mouth every 12 (twelve) hours. 08/30/19   Hall-Potvin, [place], PA-C  ibuprofen (ADVIL) 200 MG tablet Take 1,000 mg by mouth every 6 (six) hours as needed for moderate pain.    [provider]  ipratropium (ATROVENT) 0.06 % nasal spray Place 2 sprays into both nostrils 4 (four) times daily. 08/24/19   Ok Edwards, PA-C  Levonorgestrel (SKYLA) 13.5 MG IUD 1 each by Intrauterine route See admin instructions. Every 3 years    [provider]  ondansetron (ZOFRAN ODT) 4 MG disintegrating tablet Take 1 tablet (4 mg total) by mouth every 8 (eight) hours as needed. 12/23/18   Rudene Re, MD  pantoprazole (PROTONIX) 40 MG tablet Take 1 tablet (40 mg total) by mouth daily. 12/26/18   Kate Sable, MD  phentermine 30 MG capsule Take 30 mg by mouth every morning.    [provider]  methimazole (TAPAZOLE) 5 MG tablet Take 1 tablet (5 mg total) by mouth daily. 03/23/19 08/24/19  Philemon Kingdom, MD    Family History Family History  Problem Relation Age of Onset  .  Heart disease Father   . Hypertension Father   . Thyroid disease Father   . Cancer Maternal Grandfather        lung  . Early death Maternal Grandfather   . Alcohol abuse Maternal Grandmother   . COPD Maternal Grandmother   . Cancer Paternal Grandmother   . Early death Paternal Grandmother   . Kidney disease Paternal Grandmother   . Diabetes Paternal Grandfather     Social History Social History   Tobacco Use  . Smoking status: Never Smoker  . Smokeless tobacco: Never Used  Vaping Use  . Vaping Use: Never used  Substance Use Topics  . Alcohol use: No  . Drug use: No     Allergies   Patient has no known allergies.   Review of Systems As per HPI   Physical Exam Triage Vital Signs ED Triage Vitals  Enc Vitals Group     BP      Pulse      Resp       Temp      Temp src      SpO2      Weight      Height      Head Circumference      Peak Flow      Pain Score      Pain Loc      Pain Edu?      Excl. in [place]?    No data found.  Updated Vital Signs BP 108/68 (BP Location: Left Arm)   Pulse (!) 107   Temp 97.7 F (36.5 C) (Oral)   Resp 18   LMP 08/12/2019   SpO2 99%   Visual Acuity Right Eye Distance:   Left Eye Distance:   Bilateral Distance:    Right Eye Near:   Left Eye Near:    Bilateral Near:     Physical Exam Constitutional:      General: She is not in acute distress. HENT:     Head: Normocephalic and atraumatic.     Jaw: There is normal jaw occlusion. No tenderness or pain on movement.     Right Ear: Hearing, tympanic membrane, ear canal and external ear normal. No tenderness. No mastoid tenderness.     Left Ear: Hearing, tympanic membrane, ear canal and external ear normal. No tenderness. No mastoid tenderness.     Nose: No nasal deformity, septal deviation or nasal tenderness.     Right Turbinates: Not swollen or pale.     Left Turbinates: Not swollen or pale.     Right Sinus: No maxillary sinus tenderness or frontal sinus tenderness.     Left Sinus: No maxillary sinus tenderness or frontal sinus tenderness.     Comments: Bilateral maxillary sinus tenderness    Mouth/Throat:     Lips: Pink. No lesions.     Mouth: Mucous membranes are moist. No injury.     Pharynx: Oropharynx is clear. Uvula midline. No posterior oropharyngeal erythema or uvula swelling.     Comments: no tonsillar exudate or hypertrophy Cardiovascular:     Rate and Rhythm: Normal rate.  Pulmonary:     Effort: Pulmonary effort is normal.  Musculoskeletal:     Cervical back: Normal range of motion and neck supple. No muscular tenderness.  Lymphadenopathy:     Cervical: No cervical adenopathy.  Neurological:     Mental Status: She is alert and oriented to person, place, and time.      UC Treatments / Results  Labs (all labs ordered  are listed, but only abnormal results are displayed) Labs Reviewed - No data to display  EKG   Radiology No results found.  Procedures Procedures (including critical care time)  Medications Ordered in UC Medications - No data to display  Initial Impression / Assessment and Plan / UC Course  I have reviewed the triage vital signs and the nursing notes.  Pertinent labs & imaging results that were available during my care of the patient were reviewed by me and considered in my medical decision making (see chart for details).     Will cover for ABS given chronicity, worsening of symptoms.  Augmentin sent.  Return precautions discussed, pt verbalized understanding and is agreeable to plan. Final Clinical Impressions(s) / UC Diagnoses   Final diagnoses:  Subacute maxillary sinusitis     Discharge Instructions     Take antibiotic twice daily with food. Important to drink plenty of water throughout the day. Return for worsening pain, fever.    ED Prescriptions    Medication Sig Dispense Auth. Provider   amoxicillin-clavulanate (AUGMENTIN) 875-125 MG tablet Take 1 tablet by mouth every 12 (twelve) hours. 14 tablet Hall-Potvin, [place], PA-C     PDMP not reviewed this encounter.   Hall-Potvin, [place], [place] 08/30/19 1119

## 2019-08-30 NOTE — Discharge Instructions (Signed)
Take antibiotic twice daily with food. Important to drink plenty of water throughout the day. Return for worsening pain, fever.

## 2019-09-04 ENCOUNTER — Telehealth: Payer: Self-pay | Admitting: General Practice

## 2019-09-04 MED ORDER — FLUCONAZOLE 150 MG PO TABS: 150.0000 mg | ORAL_TABLET | Freq: Once | ORAL | 1 refills | Status: AC

## 2019-09-04 NOTE — Telephone Encounter (Signed)
Patient was seen at [facility] last week for acute URI as we were booked. She has 4 days left and the abx prescribed and is developing a yeast infection. She is requesting an order for diflucan ans says that typically she has to take 2 during the course of the infection. If approved please send to [place] on [address].

## 2019-09-04 NOTE — Addendum Note (Signed)
Addended by: Fonnie Mu on: 09/04/2019 03:08 PM   Modules accepted: Orders

## 2019-09-05 DIAGNOSIS — N760 Acute vaginitis: Secondary | ICD-10-CM | POA: Diagnosis not present

## 2019-09-05 DIAGNOSIS — B373 Candidiasis of vulva and vagina: Secondary | ICD-10-CM | POA: Diagnosis not present

## 2019-09-21 ENCOUNTER — Ambulatory Visit: Payer: BC Managed Care – PPO | Admitting: Internal Medicine

## 2019-10-11 DIAGNOSIS — Z6841 Body Mass Index (BMI) 40.0 and over, adult: Secondary | ICD-10-CM | POA: Diagnosis not present

## 2019-10-11 DIAGNOSIS — N760 Acute vaginitis: Secondary | ICD-10-CM | POA: Diagnosis not present

## 2019-10-11 DIAGNOSIS — Z01419 Encounter for gynecological examination (general) (routine) without abnormal findings: Secondary | ICD-10-CM | POA: Diagnosis not present

## 2019-10-12 DIAGNOSIS — Z01419 Encounter for gynecological examination (general) (routine) without abnormal findings: Secondary | ICD-10-CM | POA: Diagnosis not present

## 2019-11-07 ENCOUNTER — Encounter: Payer: BC Managed Care – PPO | Admitting: Physician Assistant

## 2019-12-04 ENCOUNTER — Other Ambulatory Visit: Payer: Self-pay

## 2019-12-04 ENCOUNTER — Encounter: Payer: Self-pay | Admitting: Physician Assistant

## 2019-12-04 ENCOUNTER — Ambulatory Visit (INDEPENDENT_AMBULATORY_CARE_PROVIDER_SITE_OTHER): Payer: BC Managed Care – PPO | Admitting: Physician Assistant

## 2019-12-04 VITALS — BP 119/78 | HR 77 | Ht 67.0 in | Wt 271.9 lb

## 2019-12-04 DIAGNOSIS — E282 Polycystic ovarian syndrome: Secondary | ICD-10-CM | POA: Insufficient documentation

## 2019-12-04 DIAGNOSIS — Z0000 Encounter for general adult medical examination without abnormal findings: Secondary | ICD-10-CM

## 2019-12-04 DIAGNOSIS — E0590 Thyrotoxicosis, unspecified without thyrotoxic crisis or storm: Secondary | ICD-10-CM

## 2019-12-04 DIAGNOSIS — Z23 Encounter for immunization: Secondary | ICD-10-CM

## 2019-12-04 NOTE — Progress Notes (Signed)
Female Physical   Impression and Recommendations:    1. Health care maintenance   2. Need for influenza vaccination   3. Thyrotoxicosis without thyroid storm, unspecified thyrotoxicosis type   4. Hyperthyroidism   5. Obesity, morbid, BMI 40.0-49.9 (HCC)      1) Anticipatory Guidance: Discussed skin CA prevention and sunscreen when outside along with skin surveillance; eating a balanced and modest diet; physical activity at least 25 minutes per day or minimum of 150 min/ week moderate to intense activity.  2) Immunizations / Screenings / Labs:   All immunizations are up-to-date per recommendations or will be updated today if pt allows.    - Patient understands with dental and vision screens they will schedule independently.  - Will obtain CBC, CMP, HgA1c, direct LDL (pt non fasting), and TSH. - UTD on Pap smear, Tdap, HIV screening - Agreeable to influenza vaccine today.  3) Weight:  Discussed goal to improve diet habits to improve overall feelings of well being and objective health data. Improve nutrient density of diet through increasing intake of fruits and vegetables and decreasing saturated fats, white flour products and refined sugars. -Per chart review, patient has lost approximately 52 pounds since last weight on record.  4) Healthcare Maintenance: -Encouraged patient to continue with weight loss efforts with dietary changes, calorie monitoring and personal trainer sessions. -Patient previously managed by endocrinology for hyperthyroidism most likely related to pregnancy.  Will add thyroid hormones to lab work. -Follow-up in 1 year for CPE and fasting blood work or sooner if needed.   No orders of the defined types were placed in this encounter.   Orders Placed This Encounter  Procedures  . Flu Vaccine QUAD 6+ mos PF IM (Fluarix Quad PF)  . CBC  . Comprehensive metabolic panel  . Hemoglobin A1c  . TSH  . Direct LDL  . T4, free  . T3     Return in about 1 year  (around 12/03/2020) for CPE and FBW.     Gross side effects, risk and benefits, and alternatives of medications discussed with patient.  Patient is aware that all medications have potential side effects and we are unable to predict every side effect or drug-drug interaction that may occur.  Expresses verbal understanding and consents to current therapy plan and treatment regimen.  F-up preventative CPE in 1 year- this is in addition to any chronic care visits.    Please see orders placed and AVS handed out to patient at the end of our visit for further patient instructions/ counseling done pertaining to today's office visit.  Note:  This note was prepared with assistance of Dragon voice recognition software. Occasional wrong-word or sound-a-like substitutions may have occurred due to the inherent limitations of voice recognition software.    Subjective:     CPE HPI: Caitlyn Ramos is a 29 y.o. female who presents to [place] [facility] today for a yearly health maintenance exam.   Health Maintenance Summary  - Reviewed and updated, unless pt declines services.   Tobacco History Reviewed:  Y, never a smoker Alcohol and/or drug use:    No concerns; no use Exercise Habits: Moderate-rigorous physical activity   Dental Home:Y Eye exams: Y, last eye exam 01/01/2019, has upcoming one Dermatology home:N  Female Health:  PAP Smear - last known results:  Followed by Ob-Gyn, 10/11/2019- will request record STD concerns:   none Birth control method: IUD Lumps or breast concerns:  none Breast Cancer Family History:  No   Additional concerns beyond health maintenance issues: none    Immunization History  Administered Date(s) Administered  . Tdap 07/15/2018     Health Maintenance  Topic Date Due  . PAP-Cervical Cytology Screening  Never done  . INFLUENZA VACCINE  Never done  . COVID-19 Vaccine (1) 12/20/2019 (Originally 06/03/2003)  . Hepatitis C Screening   12/03/2020 (Originally [birth year])  . PAP SMEAR-Modifier  10/11/2022  . TETANUS/TDAP  07/14/2028  . HIV Screening  Completed     Wt Readings from Last 3 Encounters:  12/04/19 271 lb 14.4 oz (123.3 kg)  03/23/19 (!) 323 lb (146.5 kg)  12/26/18 (!) 322 lb 12 oz (146.4 kg)   BP Readings from Last 3 Encounters:  12/04/19 119/78  08/30/19 108/68  08/24/19 (!) 135/83   Pulse Readings from Last 3 Encounters:  12/04/19 77  08/30/19 (!) 107  08/24/19 95     Past Medical History:  Diagnosis Date  . Abdominal pain   . Cough   . Fibroid   . Generalized headaches   . Nasal congestion   . Nausea   . PCOS (polycystic ovarian syndrome)   . Sore throat       Past Surgical History:  Procedure Laterality Date  . CESAREAN SECTION N/A 08/07/2018   Procedure: CESAREAN SECTION;  Surgeon: Linda Hedges, DO;  Location: [facility];  Service: Obstetrics;  Laterality: N/A;  . WISDOM TOOTH EXTRACTION  2009 approximate      Family History  Problem Relation Age of Onset  . Heart disease Father   . Hypertension Father   . Thyroid disease Father   . Cancer Maternal Grandfather        lung  . Early death Maternal Grandfather   . Alcohol abuse Maternal Grandmother   . COPD Maternal Grandmother   . Cancer Paternal Grandmother   . Early death Paternal Grandmother   . Kidney disease Paternal Grandmother   . Diabetes Paternal Grandfather       Social History   Substance and Sexual Activity  Drug Use No  ,   Social History   Substance and Sexual Activity  Alcohol Use No  ,   Social History   Tobacco Use  Smoking Status Never Smoker  Smokeless Tobacco Never Used  ,   Social History   Substance and Sexual Activity  Sexual Activity Yes  . Partners: Male  . Birth control/protection: I.U.D.    Current Outpatient Medications on File Prior to Visit  Medication Sig Dispense Refill  . ibuprofen (ADVIL) 200 MG tablet Take 1,000 mg by mouth every 6 (six) hours as needed for  moderate pain.    . Levonorgestrel (SKYLA) 13.5 MG IUD 1 each by Intrauterine route See admin instructions. Every 3 years    . pantoprazole (PROTONIX) 40 MG tablet Take 1 tablet (40 mg total) by mouth daily. 90 tablet 0  . acetaminophen (TYLENOL) 500 MG tablet Take 1,000 mg by mouth every 6 (six) hours as needed for mild pain. (Patient not taking: Reported on 12/04/2019)    . alum & mag hydroxide-simeth (MAALOX MAX) 400-400-40 MG/5ML suspension Take 5 mLs by mouth every 6 (six) hours as needed for indigestion. (Patient not taking: Reported on 12/04/2019) 355 mL 0  . amoxicillin-clavulanate (AUGMENTIN) 875-125 MG tablet Take 1 tablet by mouth every 12 (twelve) hours. (Patient not taking: Reported on 12/04/2019) 14 tablet 0  . ipratropium (ATROVENT) 0.06 % nasal spray Place 2 sprays into both nostrils 4 (four) times daily. (  Patient not taking: Reported on 12/04/2019) 15 mL 0  . ondansetron (ZOFRAN ODT) 4 MG disintegrating tablet Take 1 tablet (4 mg total) by mouth every 8 (eight) hours as needed. (Patient not taking: Reported on 12/04/2019) 20 tablet 0  . phentermine 30 MG capsule Take 30 mg by mouth every morning. (Patient not taking: Reported on 12/04/2019)    . [DISCONTINUED] methimazole (TAPAZOLE) 5 MG tablet Take 1 tablet (5 mg total) by mouth daily. 45 tablet 3   No current facility-administered medications on file prior to visit.    Allergies: Patient has no known allergies.  Review of Systems: General:   Denies fever, chills, unexplained weight loss.  Optho/Auditory:   Denies visual changes, blurred vision/LOV Respiratory:   Denies SOB, DOE more than baseline levels.   Cardiovascular:   Denies chest pain, palpitations, new onset peripheral edema  Gastrointestinal:   Denies nausea, vomiting, diarrhea.  Genitourinary: Denies dysuria, freq/ urgency, flank pain or discharge from genitals.  Endocrine:     Denies hot or cold intolerance, polyuria, polydipsia. Musculoskeletal:   Denies unexplained  myalgias, joint swelling, unexplained arthralgias, gait problems.  Skin:  Denies rash, suspicious lesions Neurological:     Denies dizziness, unexplained weakness, numbness  Psychiatric/Behavioral:   Denies mood changes, suicidal or homicidal ideations, hallucinations    Objective:    Blood pressure 119/78, pulse 77, height 5\' 7"  (1.702 m), weight 271 lb 14.4 oz (123.3 kg), SpO2 99 %, not currently breastfeeding. Body mass index is 42.59 kg/m. General Appearance:    Alert, cooperative, no distress, appears stated age  Head:    Normocephalic, without obvious abnormality, atraumatic  Eyes:    PERRL, conjunctiva/corneas clear, EOM's intact, both eyes  Ears:    Normal TM's and external ear canals, both ears  Nose:   Nares normal, septum midline, mucosa normal, no drainage    or sinus tenderness  Throat:   Lips w/o lesion, mucosa moist, and tongue normal; teeth and   gums normal  Neck:   Supple, symmetrical, trachea midline, no adenopathy;    thyroid:  no enlargement/tenderness/nodules  Back:     Symmetric, no curvature, ROM normal, no CVA tenderness  Lungs:     Clear to auscultation bilaterally, respirations unlabored, no       Wh/ R/ R  Chest Wall:    No tenderness or gross deformity; normal excursion   Heart:    Regular rate and rhythm, S1 and S2 normal, no murmur, rub   or gallop  Breast Exam:    Deferred to Ob-Gyn.  Abdomen:     Soft, non-tender, bowel sounds active all four quadrants, No  G/R/R, no masses, no organomegaly  Genitalia:    Deferred to Ob-Gyn.  Rectal:    Deferred to Ob-Gyn.  Extremities:   Extremities normal, atraumatic, no cyanosis or gross edema  Pulses:   2+ and symmetric all extremities  Skin:   Warm, dry, Skin color, texture, turgor normal, no obvious rashes or lesions Psych: No HI/SI, judgement and insight good, Euthymic mood. Full Affect.  Neurologic:   CNII-XII grossly intact, normal strength, sensation and reflexes throughout

## 2019-12-04 NOTE — Patient Instructions (Signed)
Preventive Care 21-28 Years Old, Female Preventive care refers to visits with your health care provider and lifestyle choices that can promote health and wellness. This includes:  A yearly physical exam. This may also be called an annual well check.  Regular dental visits and eye exams.  Immunizations.  Screening for certain conditions.  Healthy lifestyle choices, such as eating a healthy diet, getting regular exercise, not using drugs or products that contain nicotine and tobacco, and limiting alcohol use. What can I expect for my preventive care visit? Physical exam Your health care provider will check your:  Height and weight. This may be used to calculate body mass index (BMI), which tells if you are at a healthy weight.  Heart rate and blood pressure.  Skin for abnormal spots. Counseling Your health care provider may ask you questions about your:  Alcohol, tobacco, and drug use.  Emotional well-being.  Home and relationship well-being.  Sexual activity.  Eating habits.  Work and work environment.  Method of birth control.  Menstrual cycle.  Pregnancy history. What immunizations do I need?  Influenza (flu) vaccine  This is recommended every year. Tetanus, diphtheria, and pertussis (Tdap) vaccine  You may need a Td booster every 10 years. Varicella (chickenpox) vaccine  You may need this if you have not been vaccinated. Human papillomavirus (HPV) vaccine  If recommended by your health care provider, you may need three doses over 6 months. Measles, mumps, and rubella (MMR) vaccine  You may need at least one dose of MMR. You may also need a second dose. Meningococcal conjugate (MenACWY) vaccine  One dose is recommended if you are age 19-21 years and a first-year college student living in a residence hall, or if you have one of several medical conditions. You may also need additional booster doses. Pneumococcal conjugate (PCV13) vaccine  You may need  this if you have certain conditions and were not previously vaccinated. Pneumococcal polysaccharide (PPSV23) vaccine  You may need one or two doses if you smoke cigarettes or if you have certain conditions. Hepatitis A vaccine  You may need this if you have certain conditions or if you travel or work in places where you may be exposed to hepatitis A. Hepatitis B vaccine  You may need this if you have certain conditions or if you travel or work in places where you may be exposed to hepatitis B. Haemophilus influenzae type b (Hib) vaccine  You may need this if you have certain conditions. You may receive vaccines as individual doses or as more than one vaccine together in one shot (combination vaccines). Talk with your health care provider about the risks and benefits of combination vaccines. What tests do I need?  Blood tests  Lipid and cholesterol levels. These may be checked every 5 years starting at age 20.  Hepatitis C test.  Hepatitis B test. Screening  Diabetes screening. This is done by checking your blood sugar (glucose) after you have not eaten for a while (fasting).  Sexually transmitted disease (STD) testing.  BRCA-related cancer screening. This may be done if you have a family history of breast, ovarian, tubal, or peritoneal cancers.  Pelvic exam and Pap test. This may be done every 3 years starting at age 21. Starting at age 30, this may be done every 5 years if you have a Pap test in combination with an HPV test. Talk with your health care provider about your test results, treatment options, and if necessary, the need for more tests.   Follow these instructions at home: Eating and drinking   Eat a diet that includes fresh fruits and vegetables, whole grains, lean protein, and low-fat dairy.  Take vitamin and mineral supplements as recommended by your health care provider.  Do not drink alcohol if: ? Your health care provider tells you not to drink. ? You are  pregnant, may be pregnant, or are planning to become pregnant.  If you drink alcohol: ? Limit how much you have to 0-1 drink a day. ? Be aware of how much alcohol is in your drink. In the [place] one drink equals one 12 oz bottle of beer (355 mL), one 5 oz glass of wine (148 mL), or one 1 oz glass of hard liquor (44 mL). Lifestyle  Take daily care of your teeth and gums.  Stay active. Exercise for at least 30 minutes on 5 or more days each week.  Do not use any products that contain nicotine or tobacco, such as cigarettes, e-cigarettes, and chewing tobacco. If you need help quitting, ask your health care provider.  If you are sexually active, practice safe sex. Use a condom or other form of birth control (contraception) in order to prevent pregnancy and STIs (sexually transmitted infections). If you plan to become pregnant, see your health care provider for a preconception visit. What's next?  Visit your health care provider once a year for a well check visit.  Ask your health care provider how often you should have your eyes and teeth checked.  Stay up to date on all vaccines. This information is not intended to replace advice given to you by your health care provider. Make sure you discuss any questions you have with your health care provider. Document Revised: 09/23/2017 Document Reviewed: 09/23/2017 Elsevier Patient Education  2020 [employer].

## 2019-12-05 LAB — HEMOGLOBIN A1C
Est. average glucose Bld gHb Est-mCnc: 105 mg/dL
Hgb A1c MFr Bld: 5.3 % (ref 4.8–5.6)

## 2019-12-05 LAB — COMPREHENSIVE METABOLIC PANEL
ALT: 74 IU/L — ABNORMAL HIGH (ref 0–32)
AST: 35 IU/L (ref 0–40)
Albumin/Globulin Ratio: 1.9 (ref 1.2–2.2)
Albumin: 5 g/dL (ref 3.9–5.0)
Alkaline Phosphatase: 83 IU/L (ref 44–121)
BUN/Creatinine Ratio: 17 (ref 9–23)
BUN: 15 mg/dL (ref 6–20)
Bilirubin Total: 0.5 mg/dL (ref 0.0–1.2)
CO2: 25 mmol/L (ref 20–29)
Calcium: 9.6 mg/dL (ref 8.7–10.2)
Chloride: 100 mmol/L (ref 96–106)
Creatinine, Ser: 0.9 mg/dL (ref 0.57–1.00)
GFR calc Af Amer: 101 mL/min/{1.73_m2} (ref >59)
GFR calc non Af Amer: 87 mL/min/{1.73_m2} (ref >59)
Globulin, Total: 2.6 g/dL (ref 1.5–4.5)
Glucose: 88 mg/dL (ref 65–99)
Potassium: 4.3 mmol/L (ref 3.5–5.2)
Sodium: 139 mmol/L (ref 134–144)
Total Protein: 7.6 g/dL (ref 6.0–8.5)

## 2019-12-05 LAB — TSH: TSH: 1.67 u[IU]/mL (ref 0.450–4.500)

## 2019-12-05 LAB — CBC
Hematocrit: 41.1 % (ref 34.0–46.6)
Hemoglobin: 13.5 g/dL (ref 11.1–15.9)
MCH: 30.8 pg (ref 26.6–33.0)
MCHC: 32.8 g/dL (ref 31.5–35.7)
MCV: 94 fL (ref 79–97)
Platelets: 326 10*3/uL (ref 150–450)
RBC: 4.39 x10E6/uL (ref 3.77–5.28)
RDW: 11.9 % (ref 11.7–15.4)
WBC: 6.5 10*3/uL (ref 3.4–10.8)

## 2019-12-05 LAB — T3: T3, Total: 99 ng/dL (ref 71–180)

## 2019-12-05 LAB — LDL CHOLESTEROL, DIRECT: LDL Direct: 107 mg/dL — ABNORMAL HIGH (ref 0–99)

## 2019-12-05 LAB — T4, FREE: Free T4: 1.35 ng/dL (ref 0.82–1.77)

## 2020-01-14 ENCOUNTER — Encounter: Payer: Self-pay | Admitting: Physician Assistant

## 2020-01-15 MED ORDER — PANTOPRAZOLE SODIUM 40 MG PO TBEC
40.0000 mg | DELAYED_RELEASE_TABLET | Freq: Every day | ORAL | 0 refills | Status: DC
Start: 2020-01-15 — End: 2020-08-12

## 2020-02-20 DIAGNOSIS — M9903 Segmental and somatic dysfunction of lumbar region: Secondary | ICD-10-CM | POA: Diagnosis not present

## 2020-02-20 DIAGNOSIS — M9905 Segmental and somatic dysfunction of pelvic region: Secondary | ICD-10-CM | POA: Diagnosis not present

## 2020-02-20 DIAGNOSIS — M9904 Segmental and somatic dysfunction of sacral region: Secondary | ICD-10-CM | POA: Diagnosis not present

## 2020-02-20 DIAGNOSIS — M7918 Myalgia, other site: Secondary | ICD-10-CM | POA: Diagnosis not present

## 2020-02-23 DIAGNOSIS — M7918 Myalgia, other site: Secondary | ICD-10-CM | POA: Diagnosis not present

## 2020-02-23 DIAGNOSIS — M9904 Segmental and somatic dysfunction of sacral region: Secondary | ICD-10-CM | POA: Diagnosis not present

## 2020-02-23 DIAGNOSIS — M9903 Segmental and somatic dysfunction of lumbar region: Secondary | ICD-10-CM | POA: Diagnosis not present

## 2020-02-23 DIAGNOSIS — M9905 Segmental and somatic dysfunction of pelvic region: Secondary | ICD-10-CM | POA: Diagnosis not present

## 2020-03-10 ENCOUNTER — Other Ambulatory Visit: Payer: Self-pay

## 2020-03-10 ENCOUNTER — Ambulatory Visit
Admission: EM | Admit: 2020-03-10 | Discharge: 2020-03-10 | Disposition: A | Discharge status: Home / Self Care | Payer: Self-pay | Attending: Internal Medicine | Admitting: Internal Medicine

## 2020-03-10 DIAGNOSIS — N390 Urinary tract infection, site not specified: Secondary | ICD-10-CM | POA: Insufficient documentation

## 2020-03-10 DIAGNOSIS — N760 Acute vaginitis: Secondary | ICD-10-CM | POA: Insufficient documentation

## 2020-03-10 LAB — POCT URINALYSIS DIP (MANUAL ENTRY)
Bilirubin, UA: NEGATIVE
Glucose, UA: NEGATIVE mg/dL
Ketones, POC UA: NEGATIVE mg/dL
Nitrite, UA: NEGATIVE
Protein Ur, POC: NEGATIVE mg/dL
Spec Grav, UA: 1.025 (ref 1.010–1.025)
Urobilinogen, UA: 0.2 E.U./dL
pH, UA: 6 (ref 5.0–8.0)

## 2020-03-10 LAB — POCT URINE PREGNANCY: Preg Test, Ur: NEGATIVE

## 2020-03-10 MED ORDER — FLUCONAZOLE 150 MG PO TABS: 150.0000 mg | ORAL_TABLET | Freq: Every day | ORAL | 0 refills | Status: DC

## 2020-03-10 MED ORDER — CEFTRIAXONE SODIUM 500 MG IJ SOLR
500.0000 mg | Freq: Once | INTRAMUSCULAR | Status: AC
  Administered 2020-03-10: 500 mg via INTRAMUSCULAR

## 2020-03-10 NOTE — ED Triage Notes (Signed)
Patient has had Uti type symptoms since this morning. Pt used to have frequent UTI's. Pt is aox4 and ambulatory.

## 2020-03-12 LAB — CERVICOVAGINAL ANCILLARY ONLY
Bacterial Vaginitis (gardnerella): NEGATIVE
Candida Glabrata: NEGATIVE
Candida Vaginitis: NEGATIVE
Chlamydia: NEGATIVE
Comment: NEGATIVE
Comment: NEGATIVE
Comment: NEGATIVE
Comment: NEGATIVE
Comment: NEGATIVE
Comment: NORMAL
Neisseria Gonorrhea: NEGATIVE
Trichomonas: NEGATIVE

## 2020-03-13 LAB — URINE CULTURE: Culture: <10000 — AB

## 2020-03-14 NOTE — ED Provider Notes (Signed)
Caitlyn Ramos    CSN: [id number] Arrival date & time: 03/10/20  1515      History   Chief Complaint Chief Complaint  Patient presents with  . Urinary Frequency  . Dysuria    Since today    HPI Caitlyn Ramos is a 30 y.o. female.   Here today with 1 day history of dysuria, hematuria, urgency. Denies fever, chills, flank pain, N/V/D, vaginal sxs, concern for STIs or pregnancy. Has not been trying anything OTC for sxs. Hx of UTIs that have felt similar.      Past Medical History:  Diagnosis Date  . Abdominal pain   . Cough   . Fibroid   . Generalized headaches   . Nasal congestion   . Nausea   . PCOS (polycystic ovarian syndrome)   . Sore throat     Patient Active Problem List   Diagnosis Date Noted  . Polycystic ovary syndrome 12/04/2019  . Thyrotoxicosis without thyroid storm 12/16/2018  . S/P cesarean section 08/08/2018  . Preterm premature rupture of membranes (PPROM) with unknown onset of labor 08/07/2018  . Preterm labor 07/18/2018  . Amenorrhea 08/25/2017  . Health care maintenance 07/14/2016  . Vaginal yeast infection 07/14/2016  . Obesity (BMI 35.0-39.9 without comorbidity) 07/14/2016    Past Surgical History:  Procedure Laterality Date  . CESAREAN SECTION N/A 08/07/2018   Procedure: CESAREAN SECTION;  Surgeon: Linda Hedges, DO;  Location: [facility];  Service: Obstetrics;  Laterality: N/A;  . WISDOM TOOTH EXTRACTION  2009 approximate    OB History    Gravida  2   Para  1   Term      Preterm  1   AB  1   Living  2     SAB  1   IAB      Ectopic      Multiple  1   Live Births  2            Home Medications    Prior to Admission medications   Medication Sig Start Date End Date Taking? Authorizing Provider  fluconazole (DIFLUCAN) 150 MG tablet Take 1 tablet (150 mg total) by mouth daily. 03/10/20  Yes Volney American, PA-C  ibuprofen (ADVIL) 200 MG tablet Take 1,000 mg by mouth every 6 (six) hours as needed for  moderate pain.   Yes [provider]  Levonorgestrel (SKYLA) 13.5 MG IUD 1 each by Intrauterine route See admin instructions. Every 3 years   Yes [provider]  pantoprazole (PROTONIX) 40 MG tablet Take 1 tablet (40 mg total) by mouth daily. 01/15/20  Yes Abonza, Maritza, PA-C  methimazole (TAPAZOLE) 5 MG tablet Take 1 tablet (5 mg total) by mouth daily. 03/23/19 08/24/19  Philemon Kingdom, MD    Family History Family History  Problem Relation Age of Onset  . Heart disease Father   . Hypertension Father   . Thyroid disease Father   . Cancer Maternal Grandfather        lung  . Early death Maternal Grandfather   . Alcohol abuse Maternal Grandmother   . COPD Maternal Grandmother   . Cancer Paternal Grandmother   . Early death Paternal Grandmother   . Kidney disease Paternal Grandmother   . Diabetes Paternal Grandfather     Social History Social History   Tobacco Use  . Smoking status: Never Smoker  . Smokeless tobacco: Never Used  Vaping Use  . Vaping Use: Never used  Substance Use Topics  .  Alcohol use: No  . Drug use: No     Allergies   Patient has no known allergies.   Review of Systems Review of Systems PER HPI    Physical Exam Triage Vital Signs ED Triage Vitals  Enc Vitals Group     BP 03/10/20 1544 108/71     Pulse Rate 03/10/20 1544 63     Resp 03/10/20 1544 20     Temp 03/10/20 1544 (!) 97.4 F (36.3 C)     Temp Source 03/10/20 1544 Oral     SpO2 03/10/20 1544 99 %     Weight --      Height --      Head Circumference --      Peak Flow --      Pain Score 03/10/20 1559 0     Pain Loc --      Pain Edu? --      Excl. in [place]? --    No data found.  Updated Vital Signs BP 108/71 (BP Location: Left Arm)   Pulse 63   Temp (!) 97.4 F (36.3 C) (Oral)   Resp 20   SpO2 99%   Visual Acuity Right Eye Distance:   Left Eye Distance:   Bilateral Distance:    Right Eye Near:   Left Eye Near:    Bilateral Near:     Physical  Exam Vitals and nursing note reviewed.  Constitutional:      Appearance: Normal appearance. She is not ill-appearing.  HENT:     Head: Atraumatic.  Eyes:     Extraocular Movements: Extraocular movements intact.     Conjunctiva/sclera: Conjunctivae normal.  Cardiovascular:     Rate and Rhythm: Normal rate and regular rhythm.     Heart sounds: Normal heart sounds.  Pulmonary:     Effort: Pulmonary effort is normal.     Breath sounds: Normal breath sounds.  Abdominal:     General: Bowel sounds are normal. There is no distension.     Palpations: Abdomen is soft.     Tenderness: There is no abdominal tenderness. There is no right CVA tenderness, left CVA tenderness or guarding.  Genitourinary:    Comments: GU exam deferred Musculoskeletal:        General: Normal range of motion.     Cervical back: Normal range of motion and neck supple.  Skin:    General: Skin is warm and dry.  Neurological:     Mental Status: She is alert and oriented to person, place, and time.  Psychiatric:        Mood and Affect: Mood normal.        Thought Content: Thought content normal.        Judgment: Judgment normal.     UC Treatments / Results  Labs (all labs ordered are listed, but only abnormal results are displayed) Labs Reviewed  URINE CULTURE - Abnormal; Notable for the following components:      Result Value   Culture   (*)    Value: <10,000 COLONIES/mL INSIGNIFICANT GROWTH Performed at [facility], [address] [place], [place] [address]    All other components within normal limits  POCT URINALYSIS DIP (MANUAL ENTRY) - Abnormal; Notable for the following components:   Blood, UA trace-lysed (*)    Leukocytes, UA Trace (*)    All other components within normal limits  POCT URINE PREGNANCY  CERVICOVAGINAL ANCILLARY ONLY    EKG   Radiology No results found.  Procedures Procedures (including critical care time)  Medications Ordered in UC Medications  cefTRIAXone  (ROCEPHIN) injection 500 mg (500 mg Intramuscular Given 03/10/20 1621)    Initial Impression / Assessment and Plan / UC Course  I have reviewed the triage vital signs and the nursing notes.  Pertinent labs & imaging results that were available during my care of the patient were reviewed by me and considered in my medical decision making (see chart for details).     U/A with trace leuks and hgb, declines vaginal swab today. Requesting IM abx rather than oral as she states these work best for her. IM rocephin given, urine culture pending. Return if sxs not improving or worsening.   Final Clinical Impressions(s) / UC Diagnoses   Final diagnoses:  Acute lower UTI  Acute vaginitis   Discharge Instructions   None    ED Prescriptions    Medication Sig Dispense Auth. Provider   fluconazole (DIFLUCAN) 150 MG tablet Take 1 tablet (150 mg total) by mouth daily. 3 tablet Volney American, [place]     PDMP not reviewed this encounter.   Merrie Roof S[place], [place] 03/14/20 [phone]

## 2020-03-31 ENCOUNTER — Encounter: Payer: Self-pay | Admitting: Physician Assistant

## 2020-05-14 ENCOUNTER — Emergency Department (HOSPITAL_BASED_OUTPATIENT_CLINIC_OR_DEPARTMENT_OTHER)
Admission: EM | Admit: 2020-05-14 | Discharge: 2020-05-14 | Disposition: A | Discharge status: Home / Self Care | Payer: BC Managed Care – PPO | Attending: Emergency Medicine | Admitting: Emergency Medicine

## 2020-05-14 ENCOUNTER — Other Ambulatory Visit: Payer: Self-pay

## 2020-05-14 ENCOUNTER — Encounter (HOSPITAL_BASED_OUTPATIENT_CLINIC_OR_DEPARTMENT_OTHER): Payer: Self-pay | Admitting: *Deleted

## 2020-05-14 ENCOUNTER — Emergency Department (HOSPITAL_BASED_OUTPATIENT_CLINIC_OR_DEPARTMENT_OTHER): Payer: BC Managed Care – PPO

## 2020-05-14 DIAGNOSIS — R079 Chest pain, unspecified: Secondary | ICD-10-CM | POA: Diagnosis not present

## 2020-05-14 DIAGNOSIS — R42 Dizziness and giddiness: Secondary | ICD-10-CM | POA: Diagnosis not present

## 2020-05-14 DIAGNOSIS — R072 Precordial pain: Secondary | ICD-10-CM | POA: Insufficient documentation

## 2020-05-14 DIAGNOSIS — R0789 Other chest pain: Secondary | ICD-10-CM

## 2020-05-14 LAB — CBC WITH DIFFERENTIAL/PLATELET
Abs Immature Granulocytes: 0.03 10*3/uL (ref 0.00–0.07)
Basophils Absolute: 0 10*3/uL (ref 0.0–0.1)
Basophils Relative: 0 %
Eosinophils Absolute: 0.1 10*3/uL (ref 0.0–0.5)
Eosinophils Relative: 1 %
HCT: 38.8 % (ref 36.0–46.0)
Hemoglobin: 12.8 g/dL (ref 12.0–15.0)
Immature Granulocytes: 0 %
Lymphocytes Relative: 18 %
Lymphs Abs: 1.8 10*3/uL (ref 0.7–4.0)
MCH: 31.4 pg (ref 26.0–34.0)
MCHC: 33 g/dL (ref 30.0–36.0)
MCV: 95.1 fL (ref 80.0–100.0)
Monocytes Absolute: 0.4 10*3/uL (ref 0.1–1.0)
Monocytes Relative: 4 %
Neutro Abs: 7.8 10*3/uL — ABNORMAL HIGH (ref 1.7–7.7)
Neutrophils Relative %: 77 %
Platelets: 273 10*3/uL (ref 150–400)
RBC: 4.08 MIL/uL (ref 3.87–5.11)
RDW: 13.5 % (ref 11.5–15.5)
WBC: 10.1 10*3/uL (ref 4.0–10.5)
nRBC: 0 % (ref 0.0–0.2)

## 2020-05-14 LAB — COMPREHENSIVE METABOLIC PANEL
ALT: 24 U/L (ref 0–44)
AST: 22 U/L (ref 15–41)
Albumin: 4.3 g/dL (ref 3.5–5.0)
Alkaline Phosphatase: 55 U/L (ref 38–126)
Anion gap: 9 (ref 5–15)
BUN: 18 mg/dL (ref 6–20)
CO2: 25 mmol/L (ref 22–32)
Calcium: 9.2 mg/dL (ref 8.9–10.3)
Chloride: 101 mmol/L (ref 98–111)
Creatinine, Ser: 0.83 mg/dL (ref 0.44–1.00)
GFR, Estimated: >60 mL/min (ref >60)
Glucose, Bld: 93 mg/dL (ref 70–99)
Potassium: 4.1 mmol/L (ref 3.5–5.1)
Sodium: 135 mmol/L (ref 135–145)
Total Bilirubin: 0.6 mg/dL (ref 0.3–1.2)
Total Protein: 7.5 g/dL (ref 6.5–8.1)

## 2020-05-14 LAB — LIPASE, BLOOD: Lipase: 30 U/L (ref 11–51)

## 2020-05-14 LAB — TROPONIN I (HIGH SENSITIVITY): Troponin I (High Sensitivity): 5 ng/L (ref <18)

## 2020-05-14 MED ORDER — SODIUM CHLORIDE 0.9 % IV BOLUS: 500.0000 mL | Freq: Once | INTRAVENOUS | Status: DC

## 2020-05-14 NOTE — ED Triage Notes (Signed)
Chest pain and dizziness tonight during a work out class. Tightness in the center of her chest that felt like GERD.

## 2020-05-14 NOTE — ED Provider Notes (Addendum)
[facility] EMERGENCY DEPARTMENT Provider Note   CSN: [id number] Arrival date & time: 05/14/20  2036     History No chief complaint on file.   Caitlyn Ramos is a 30 y.o. female presenting for evaluation of chest pain.   Patient states she was exercising when she developed substernal chest discomfort which she describes as a burning sensation.  She had associated dizziness and discomfort.  When she stopped working out, symptoms improved.Marland Kitchen  She was evaluated at urgent care, recommended ER for further evaluation.  She reports symptoms are significantly improved right now, but not completely resolved.  Dizziness was described as feeling off balance, no room spinning or lightheaded.  She has not taken anything for her symptoms.  She states the pain is a burning pain, similar to heartburn but slightly different.  She has been taking her medication as prescribed, and has not eaten any triggering foods.  She reports no medical problems currently, takes no medications besides heartburn medication as needed.  Additional history obtained from chart review.  Patient with a history of PCOS, previous thyroid issues postdelivery  HPI     Past Medical History:  Diagnosis Date  . Abdominal pain   . Cough   . Fibroid   . Generalized headaches   . Nasal congestion   . Nausea   . PCOS (polycystic ovarian syndrome)   . Sore throat     Patient Active Problem List   Diagnosis Date Noted  . Polycystic ovary syndrome 12/04/2019  . Thyrotoxicosis without thyroid storm 12/16/2018  . S/P cesarean section 08/08/2018  . Preterm premature rupture of membranes (PPROM) with unknown onset of labor 08/07/2018  . Preterm labor 07/18/2018  . Amenorrhea 08/25/2017  . Health care maintenance 07/14/2016  . Vaginal yeast infection 07/14/2016  . Obesity (BMI 35.0-39.9 without comorbidity) 07/14/2016    Past Surgical History:  Procedure Laterality Date  . CESAREAN SECTION N/A 08/07/2018   Procedure:  CESAREAN SECTION;  Surgeon: Linda Hedges, DO;  Location: [facility];  Service: Obstetrics;  Laterality: N/A;  . WISDOM TOOTH EXTRACTION  2009 approximate     OB History    Gravida  2   Para  1   Term      Preterm  1   AB  1   Living  2     SAB  1   IAB      Ectopic      Multiple  1   Live Births  2           Family History  Problem Relation Age of Onset  . Heart disease Father   . Hypertension Father   . Thyroid disease Father   . Cancer Maternal Grandfather        lung  . Early death Maternal Grandfather   . Alcohol abuse Maternal Grandmother   . COPD Maternal Grandmother   . Cancer Paternal Grandmother   . Early death Paternal Grandmother   . Kidney disease Paternal Grandmother   . Diabetes Paternal Grandfather     Social History   Tobacco Use  . Smoking status: Never Smoker  . Smokeless tobacco: Never Used  Vaping Use  . Vaping Use: Never used  Substance Use Topics  . Alcohol use: No  . Drug use: No    Home Medications Prior to Admission medications   Medication Sig Start Date End Date Taking? Authorizing Provider  fluconazole (DIFLUCAN) 150 MG tablet Take 1 tablet (150 mg total) by mouth  daily. 03/10/20   Volney American, PA-C  ibuprofen (ADVIL) 200 MG tablet Take 1,000 mg by mouth every 6 (six) hours as needed for moderate pain.    [provider]  Levonorgestrel (SKYLA) 13.5 MG IUD 1 each by Intrauterine route See admin instructions. Every 3 years    [provider]  pantoprazole (PROTONIX) 40 MG tablet Take 1 tablet (40 mg total) by mouth daily. 01/15/20   Lorrene Reid, PA-C  methimazole (TAPAZOLE) 5 MG tablet Take 1 tablet (5 mg total) by mouth daily. 03/23/19 08/24/19  Philemon Kingdom, MD    Allergies    Patient has no known allergies.  Review of Systems   Review of Systems  Cardiovascular: Positive for chest pain.  Neurological: Positive for dizziness.  All other systems reviewed and are  negative.   Physical Exam Updated Vital Signs BP 117/68 (BP Location: Right Arm)   Pulse (!) 56   Temp 98.9 F (37.2 C) (Oral)   Resp 18   Ht 5\' 7"  (1.702 m)   Wt 112.5 kg   SpO2 100%   BMI 38.84 kg/m   Physical Exam Vitals and nursing note reviewed.  Constitutional:      General: She is not in acute distress.    Appearance: She is well-developed.     Comments: Resting in the bed in NAD  HENT:     Head: Normocephalic and atraumatic.  Eyes:     Extraocular Movements: Extraocular movements intact.     Conjunctiva/sclera: Conjunctivae normal.     Pupils: Pupils are equal, round, and reactive to light.  Cardiovascular:     Rate and Rhythm: Normal rate and regular rhythm.     Pulses: Normal pulses.  Pulmonary:     Effort: Pulmonary effort is normal. No respiratory distress.     Breath sounds: Normal breath sounds. No wheezing.  Abdominal:     General: There is no distension.     Palpations: Abdomen is soft. There is no mass.     Tenderness: There is no abdominal tenderness. There is no guarding or rebound.  Musculoskeletal:        General: Normal range of motion.     Cervical back: Normal range of motion and neck supple.  Skin:    General: Skin is warm and dry.     Capillary Refill: Capillary refill takes less than 2 seconds.  Neurological:     Mental Status: She is alert and oriented to person, place, and time.     ED Results / Procedures / Treatments   Labs (all labs ordered are listed, but only abnormal results are displayed) Labs Reviewed  CBC WITH DIFFERENTIAL/PLATELET - Abnormal; Notable for the following components:      Result Value   Neutro Abs 7.8 (*)    All other components within normal limits  COMPREHENSIVE METABOLIC PANEL  LIPASE, BLOOD  TROPONIN I (HIGH SENSITIVITY)    EKG EKG Interpretation  Date/Time:  Tuesday May 14 2020 20:47:10 EDT Ventricular Rate:  94 PR Interval:  158 QRS Duration: 80 QT Interval:  340 QTC Calculation: 425 R  Axis:   26 Text Interpretation: Sinus rhythm with marked sinus arrhythmia , new Nonspecific T wave abnormality inaVL Confirmed by Blanchie Dessert [phone]) on 05/14/2020 8:52:20 PM   Radiology No results found.  Procedures Procedures   Medications Ordered in ED Medications - No data to display  ED Course  I have reviewed the triage vital signs and the nursing notes.  Pertinent labs &  imaging results that were available during my care of the patient were reviewed by me and considered in my medical decision making (see chart for details).    MDM Rules/Calculators/A&P                           Patient presented for evaluation of chest pain and dizziness.  On exam, patient appears nontoxic.  Symptoms have mostly resolved.  Describes it as a burning pain, states it is similar to heartburn.  In the setting of exercise, this could be happening symptoms.  Also considered dehydration, the patient states she is drinking lots of water.  Less likely ACS as patient is without significant risk factors.  However in setting of chest pain and exercise, will obtain cardiac work-up.  Labs interpreted by me, overall reassuring.  Initial troponin negative.  Electrolytes stable.  No acidosis.  Hemoglobin stable.  EKG shows nonspecific T wave abnormality.  Patient declined chest x-ray.  Patient declined symptomatic treatment including fluids.  Orthostatics are positive, however patient continues to maintain that she is hydrated enough and does not need further hydration.  Exam is not consistent with dissection, PE, pneumothorax.  I discussed findings with patient.  Encouraged her to follow-up closely with primary care for recheck of symptoms if not improved.  Encouraged her to take heartburn medication as prescribed.  At this time, patient appears safe for discharge.  Return precautions given.  Patient states she understands and agrees to plan.   Final Clinical Impression(s) / ED Diagnoses Final diagnoses:   Atypical chest pain  Dizziness    Rx / DC Orders ED Discharge Orders    None       Franchot Heidelberg, PA-C 05/14/20 2348    Blanchie Dessert, MD 05/17/20 0700

## 2020-05-14 NOTE — Discharge Instructions (Addendum)
Follow-up with your primary care doctor as needed for recheck of your symptoms if you continue to feel poorly. Return to the emergency room with any new, symptom or concerning symptoms.

## 2020-05-31 DIAGNOSIS — M9902 Segmental and somatic dysfunction of thoracic region: Secondary | ICD-10-CM | POA: Diagnosis not present

## 2020-05-31 DIAGNOSIS — M9901 Segmental and somatic dysfunction of cervical region: Secondary | ICD-10-CM | POA: Diagnosis not present

## 2020-05-31 DIAGNOSIS — M9908 Segmental and somatic dysfunction of rib cage: Secondary | ICD-10-CM | POA: Diagnosis not present

## 2020-05-31 DIAGNOSIS — M7918 Myalgia, other site: Secondary | ICD-10-CM | POA: Diagnosis not present

## 2020-06-07 DIAGNOSIS — M9902 Segmental and somatic dysfunction of thoracic region: Secondary | ICD-10-CM | POA: Diagnosis not present

## 2020-06-07 DIAGNOSIS — M9908 Segmental and somatic dysfunction of rib cage: Secondary | ICD-10-CM | POA: Diagnosis not present

## 2020-06-07 DIAGNOSIS — M9901 Segmental and somatic dysfunction of cervical region: Secondary | ICD-10-CM | POA: Diagnosis not present

## 2020-06-07 DIAGNOSIS — M7918 Myalgia, other site: Secondary | ICD-10-CM | POA: Diagnosis not present

## 2020-06-14 DIAGNOSIS — M9908 Segmental and somatic dysfunction of rib cage: Secondary | ICD-10-CM | POA: Diagnosis not present

## 2020-06-14 DIAGNOSIS — M9902 Segmental and somatic dysfunction of thoracic region: Secondary | ICD-10-CM | POA: Diagnosis not present

## 2020-06-14 DIAGNOSIS — M9901 Segmental and somatic dysfunction of cervical region: Secondary | ICD-10-CM | POA: Diagnosis not present

## 2020-06-14 DIAGNOSIS — M7918 Myalgia, other site: Secondary | ICD-10-CM | POA: Diagnosis not present

## 2020-07-08 ENCOUNTER — Telehealth: Payer: Self-pay | Admitting: Physician Assistant

## 2020-07-08 DIAGNOSIS — X17XXXA Contact with hot engines, machinery and tools, initial encounter: Secondary | ICD-10-CM | POA: Diagnosis not present

## 2020-07-08 DIAGNOSIS — T24031A Burn of unspecified degree of right lower leg, initial encounter: Secondary | ICD-10-CM | POA: Diagnosis not present

## 2020-07-08 DIAGNOSIS — Z8639 Personal history of other endocrine, nutritional and metabolic disease: Secondary | ICD-10-CM | POA: Diagnosis not present
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
## 2020-07-10 DIAGNOSIS — M9908 Segmental and somatic dysfunction of rib cage: Secondary | ICD-10-CM | POA: Diagnosis not present

## 2020-07-10 DIAGNOSIS — M9901 Segmental and somatic dysfunction of cervical region: Secondary | ICD-10-CM | POA: Diagnosis not present

## 2020-07-10 DIAGNOSIS — M9902 Segmental and somatic dysfunction of thoracic region: Secondary | ICD-10-CM | POA: Diagnosis not present

## 2020-07-10 DIAGNOSIS — M7918 Myalgia, other site: Secondary | ICD-10-CM | POA: Diagnosis not present

## 2020-07-11 ENCOUNTER — Other Ambulatory Visit: Payer: Self-pay | Admitting: Chiropractic Medicine

## 2020-07-11 DIAGNOSIS — G8929 Other chronic pain: Secondary | ICD-10-CM

## 2020-07-15 ENCOUNTER — Other Ambulatory Visit: Payer: Self-pay | Admitting: Chiropractic Medicine

## 2020-07-15 DIAGNOSIS — G8929 Other chronic pain: Secondary | ICD-10-CM

## 2020-07-17 DIAGNOSIS — M7918 Myalgia, other site: Secondary | ICD-10-CM | POA: Diagnosis not present

## 2020-07-17 DIAGNOSIS — M9902 Segmental and somatic dysfunction of thoracic region: Secondary | ICD-10-CM | POA: Diagnosis not present

## 2020-07-17 DIAGNOSIS — M9901 Segmental and somatic dysfunction of cervical region: Secondary | ICD-10-CM | POA: Diagnosis not present

## 2020-07-17 DIAGNOSIS — M9908 Segmental and somatic dysfunction of rib cage: Secondary | ICD-10-CM | POA: Diagnosis not present

## 2020-07-22 ENCOUNTER — Ambulatory Visit
Admission: RE | Admit: 2020-07-22 | Discharge: 2020-07-22 | Disposition: A | Discharge status: Home / Self Care | Payer: BC Managed Care – PPO | Source: Ambulatory Visit | Attending: Chiropractic Medicine | Admitting: Chiropractic Medicine

## 2020-07-22 DIAGNOSIS — M67813 Other specified disorders of tendon, right shoulder: Secondary | ICD-10-CM | POA: Diagnosis not present

## 2020-07-22 DIAGNOSIS — G8929 Other chronic pain: Secondary | ICD-10-CM

## 2020-07-22 DIAGNOSIS — M25511 Pain in right shoulder: Secondary | ICD-10-CM | POA: Diagnosis not present

## 2020-07-22 MED ORDER — IOPAMIDOL (ISOVUE-M 200) INJECTION 41%
15.0000 mL | Freq: Once | INTRAMUSCULAR | Status: AC
  Administered 2020-07-22: 16:00:00 15 mL via INTRA_ARTICULAR

## 2020-07-24 ENCOUNTER — Ambulatory Visit: Payer: BC Managed Care – PPO | Admitting: Family Medicine

## 2020-07-24 ENCOUNTER — Encounter: Payer: Self-pay | Admitting: Family Medicine

## 2020-07-24 ENCOUNTER — Other Ambulatory Visit: Payer: Self-pay

## 2020-07-24 VITALS — Ht 67.0 in | Wt 248.0 lb

## 2020-07-24 DIAGNOSIS — M67911 Unspecified disorder of synovium and tendon, right shoulder: Secondary | ICD-10-CM

## 2020-07-24 MED ORDER — METHYLPREDNISOLONE ACETATE 40 MG/ML IJ SUSP
40.0000 mg | Freq: Once | INTRAMUSCULAR | Status: AC
  Administered 2020-07-24: 14:00:00 40 mg via INTRA_ARTICULAR

## 2020-07-24 NOTE — Progress Notes (Signed)
   PCP: Lorrene Reid, PA-C  Subjective:   HPI: Patient is a 30 y.o. female here for evaluation of right shoulder pain and consideration of subacromial injection.  She reports that she has had ongoing right shoulder pain for quite some time now.  She initially started having pain when she was doing a lot of lifting and overhead movements in the gym and also has twin children who she was lifting a lot.  She presented to her chiropractor, Dr. Johnston Ebbs, who did a fair amount of work including home exercise program and manipulation therapy.  Eventually, he obtained an MRI of her right shoulder on 6/27 which showed mild tendinosis of the supraspinatus tendon without tear.  He then referred her here for consideration of subacromial injection.  Currently, patient states that the pain is mostly with overhead movements, reaching above her head, and especially with overhead presses in the gym.  She generally feels like she is trending in the right direction but would like to see if CSI would be helpful to get her over the hump.  No weakness, no numbness or tingling, no significant neck pain.  Review of Systems:  Per HPI.   [place], medications and smoking status reviewed.      Objective:  Physical Exam:  No flowsheet data found.   Gen: awake, alert, NAD, comfortable in exam room Pulm: breathing unlabored  Right shoulder: -Inspection: no obvious deformity, atrophy, or asymmetry. No bruising. No swelling -Palpation: no TTP over [facility] joint or bicipital groove. -ROM: Full ROM in abduction, flexion, internal/external rotation both passively and actively.  Mild pain with end abduction and internal rotation. NV intact distally Normal scapular function observed. Special Tests:  - Impingement: Neg Hawkins, Neg neers, mildly positive empty can sign for pain. - Supraspinatus: Mildly positive negative empty can for pain.  5/5 strength with resisted flexion at 20 degrees - Infraspinatus/Teres Minor:  5/5 strength with ER - Subscapularis: 5/5 strength with IR - Biceps tendon: Neg Speeds, Neg Yerrgason's  - Labrum: Negative Obriens, good stability - No painful arc and no drop arm sign  Reviewed MRI performed on 07/22/2020 which shows supraspinatus tendinopathy without any significant tear.    Assessment & Plan:  1.  Right rotator cuff tendinopathy Patient has not tried physical therapy, manipulation therapy, and many other modalities.  She is currently under the care of her chiropractor Dr. Hardin Negus, who recommended being seen here today for CSI.  I think that is very reasonable and may help her to finally get the last bit of this to calm down.  To that end, subacromial injection was performed today.  She will follow-up with Dr. Hardin Negus for further care, and she can follow-up here as needed.  Procedure note: Prior to the procedure, risks and benefits were discussed, and patient provided both verbal and written consent.  Timeout was called prior the procedure and correct location was identified.  Patient was seated on the examination table.  Her right posterior shoulder was identified using palpation guidance.  She was then cleaned with alcohol swab, anesthetized ethyl chloride spray, and injected with 3 cc of 1% lidocaine and 40 mg of Depo-Medrol.  She tolerated procedure well.    Caitlyn Ligas, MD [facility] 07/24/2020 1:24 PM  Addendum:  I was the preceptor for this visit and available for immediate consultation.  Caitlyn Lemon MD Kirt Boys

## 2020-08-12 ENCOUNTER — Other Ambulatory Visit: Payer: Self-pay | Admitting: Physician Assistant

## 2020-08-12 NOTE — Telephone Encounter (Signed)
Please call patient to schedule her annual physical for November this year.

## 2020-10-20 ENCOUNTER — Ambulatory Visit
Admission: RE | Admit: 2020-10-20 | Discharge: 2020-10-20 | Disposition: A | Discharge status: Home / Self Care | Payer: BC Managed Care – PPO | Source: Ambulatory Visit | Attending: Internal Medicine | Admitting: Internal Medicine

## 2020-10-20 ENCOUNTER — Other Ambulatory Visit: Payer: Self-pay

## 2020-10-20 VITALS — BP 121/81 | HR 82 | Temp 97.4°F | Resp 20

## 2020-10-20 DIAGNOSIS — Z20822 Contact with and (suspected) exposure to covid-19: Secondary | ICD-10-CM | POA: Diagnosis not present

## 2020-10-20 DIAGNOSIS — J029 Acute pharyngitis, unspecified: Secondary | ICD-10-CM | POA: Diagnosis not present

## 2020-10-20 DIAGNOSIS — H1031 Unspecified acute conjunctivitis, right eye: Secondary | ICD-10-CM | POA: Diagnosis not present

## 2020-10-20 DIAGNOSIS — J069 Acute upper respiratory infection, unspecified: Secondary | ICD-10-CM | POA: Insufficient documentation

## 2020-10-20 LAB — POCT RAPID STREP A (OFFICE): Rapid Strep A Screen: NEGATIVE

## 2020-10-20 MED ORDER — ERYTHROMYCIN 5 MG/GM OP OINT: TOPICAL_OINTMENT | OPHTHALMIC | 0 refills | Status: DC

## 2020-10-20 MED ORDER — FLUTICASONE PROPIONATE 50 MCG/ACT NA SUSP: 1.0000 | Freq: Every day | NASAL | 0 refills | Status: DC

## 2020-10-20 MED ORDER — PROMETHAZINE-DM 6.25-15 MG/5ML PO SYRP: 5.0000 mL | ORAL_SOLUTION | Freq: Four times a day (QID) | ORAL | 0 refills | Status: DC | PRN

## 2020-10-20 NOTE — ED Triage Notes (Signed)
Children were recently dx with ear infections and placed on antibiotics. Now patient is experiencing similar symptoms, started on Wednesday. C/o ear pain with some eye drainage, nasal congestion, sore throat.

## 2020-10-20 NOTE — Discharge Instructions (Addendum)
Your rapid strep test was negative.  Throat culture and COVID-19 viral swab are pending.  Your symptoms seem related to a viral respiratory infection.  Suspect the symptoms resolve in the next few days with medications that have been prescribed for you.  Please pick up cetirizine over-the-counter.  Flonase, erythromycin eye ointment, and cough medication have been prescribed for you.  Please be advised that this cough medication can cause drowsiness.  Follow-up with primary care or urgent care if symptoms persist.

## 2020-10-20 NOTE — ED Provider Notes (Signed)
[facility]    CSN: [id number] Arrival date & time: 10/20/20  1339      History   Chief Complaint Chief Complaint  Patient presents with   Otalgia    HPI Caitlyn Ramos is a 30 y.o. female.   Patient presents with 5-day history of cough, nasal congestion, bilateral ear pain, sore throat, right eye drainage.  Right eye purulent drainage started this morning.  Denies any blurry vision.  Denies any known fevers.  Cough is productive with yellow sputum.  Children have similar symptoms and were diagnosed with viral respiratory infections.  Patient has taken several over-the-counter cough and cold medications with minimal improvement in symptoms.  Denies any chest pain or shortness of breath.   Otalgia  Past Medical History:  Diagnosis Date   Abdominal pain    Cough    Fibroid    Generalized headaches    Nasal congestion    Nausea    PCOS (polycystic ovarian syndrome)    Sore throat     Patient Active Problem List   Diagnosis Date Noted   Polycystic ovary syndrome 12/04/2019   Thyrotoxicosis without thyroid storm 12/16/2018   S/P cesarean section 08/08/2018   Preterm premature rupture of membranes (PPROM) with unknown onset of labor 08/07/2018   Preterm labor 07/18/2018   Amenorrhea 08/25/2017   Health care maintenance 07/14/2016   Vaginal yeast infection 07/14/2016   Obesity (BMI 35.0-39.9 without comorbidity) 07/14/2016    Past Surgical History:  Procedure Laterality Date   CESAREAN SECTION N/A 08/07/2018   Procedure: CESAREAN SECTION;  Surgeon: Linda Hedges, DO;  Location: [facility];  Service: Obstetrics;  Laterality: N/A;   WISDOM TOOTH EXTRACTION  2009 approximate    OB History     Gravida  2   Para  1   Term      Preterm  1   AB  1   Living  2      SAB  1   IAB      Ectopic      Multiple  1   Live Births  2            Home Medications    Prior to Admission medications   Medication Sig Start Date End Date Taking?  Authorizing Provider  erythromycin ophthalmic ointment Place a 1/2 inch ribbon of ointment into the lower eyelid 4 times for 7 days. 10/20/20  Yes Odis Luster, FNP  fluticasone (FLONASE) 50 MCG/ACT nasal spray Place 1 spray into both nostrils daily. 10/20/20  Yes Odis Luster, FNP  promethazine-dextromethorphan (PROMETHAZINE-DM) 6.25-15 MG/5ML syrup Take 5 mLs by mouth 4 (four) times daily as needed for cough. 10/20/20  Yes Odis Luster, FNP  fluconazole (DIFLUCAN) 150 MG tablet Take 1 tablet (150 mg total) by mouth daily. 03/10/20   Volney American, PA-C  ibuprofen (ADVIL) 200 MG tablet Take 1,000 mg by mouth every 6 (six) hours as needed for moderate pain.    [provider]  Levonorgestrel (SKYLA) 13.5 MG IUD 1 each by Intrauterine route See admin instructions. Every 3 years    [provider]  pantoprazole (PROTONIX) 40 MG tablet Take 1 tablet by mouth once daily 08/12/20   Abonza, Maritza, PA-C  methimazole (TAPAZOLE) 5 MG tablet Take 1 tablet (5 mg total) by mouth daily. 03/23/19 08/24/19  Philemon Kingdom, MD    Family History Family History  Problem Relation Age of Onset   Heart disease Father  Hypertension Father    Thyroid disease Father    Cancer Maternal Grandfather        lung   Early death Maternal Grandfather    Alcohol abuse Maternal Grandmother    COPD Maternal Grandmother    Cancer Paternal Grandmother    Early death Paternal Grandmother    Kidney disease Paternal Grandmother    Diabetes Paternal Grandfather     Social History Social History   Tobacco Use   Smoking status: Never   Smokeless tobacco: Never  Vaping Use   Vaping Use: Never used  Substance Use Topics   Alcohol use: No   Drug use: No     Allergies   Patient has no known allergies.   Review of Systems Review of Systems Per HPI  Physical Exam Triage Vital Signs ED Triage Vitals  Enc Vitals Group     BP 10/20/20 1347 121/81     Pulse Rate 10/20/20 1347 82      Resp 10/20/20 1347 20     Temp 10/20/20 1347 (!) 97.4 F (36.3 C)     Temp Source 10/20/20 1347 Oral     SpO2 10/20/20 1347 98 %     Weight --      Height --      Head Circumference --      Peak Flow --      Pain Score 10/20/20 1348 2     Pain Loc --      Pain Edu? --      Excl. in [place]? --    No data found.  Updated Vital Signs BP 121/81 (BP Location: Left Arm)   Pulse 82   Temp (!) 97.4 F (36.3 C) (Oral)   Resp 20   SpO2 98%   Visual Acuity Right Eye Distance: 20/13 Left Eye Distance: 20/13 Bilateral Distance: 20/13  Right Eye Near:   Left Eye Near:    Bilateral Near:     Physical Exam Constitutional:      General: She is not in acute distress.    Appearance: Normal appearance. She is not toxic-appearing or diaphoretic.  HENT:     Head: Normocephalic and atraumatic.     Right Ear: Ear canal normal. A middle ear effusion is present. Tympanic membrane is not erythematous or bulging.     Left Ear: Ear canal normal. A middle ear effusion is present. Tympanic membrane is not erythematous or bulging.     Nose: Congestion present.     Mouth/Throat:     Mouth: Mucous membranes are moist.     Pharynx: Posterior oropharyngeal erythema present.  Eyes:     General: Lids are normal.     Extraocular Movements: Extraocular movements intact.     Conjunctiva/sclera:     Right eye: Right conjunctiva is injected. Exudate present.     Pupils: Pupils are equal, round, and reactive to light.     Comments: Purulent exudate from right eye.   Cardiovascular:     Rate and Rhythm: Normal rate and regular rhythm.     Pulses: Normal pulses.     Heart sounds: Normal heart sounds.  Pulmonary:     Effort: Pulmonary effort is normal. No respiratory distress.     Breath sounds: Normal breath sounds. No wheezing.  Abdominal:     General: Abdomen is flat. Bowel sounds are normal.     Palpations: Abdomen is soft.  Musculoskeletal:        General: Normal range of motion.  Cervical  back: Normal range of motion.  Skin:    General: Skin is warm and dry.  Neurological:     General: No focal deficit present.     Mental Status: She is alert and oriented to person, place, and time. Mental status is at baseline.  Psychiatric:        Mood and Affect: Mood normal.        Behavior: Behavior normal.     UC Treatments / Results  Labs (all labs ordered are listed, but only abnormal results are displayed) Labs Reviewed  CULTURE, GROUP A STREP ([place])  NOVEL CORONAVIRUS, NAA  POCT RAPID STREP A (OFFICE)    EKG   Radiology No results found.  Procedures Procedures (including critical care time)  Medications Ordered in UC Medications - No data to display  Initial Impression / Assessment and Plan / UC Course  I have reviewed the triage vital signs and the nursing notes.  Pertinent labs & imaging results that were available during my care of the patient were reviewed by me and considered in my medical decision making (see chart for details).     Patient presents with symptoms likely from a viral upper respiratory infection. Differential includes bacterial pneumonia, sinusitis, allergic rhinitis, Covid 19. Do not suspect underlying cardiopulmonary process. Symptoms seem unlikely related to ACS, CHF or COPD exacerbations, pneumonia, pneumothorax. Patient is nontoxic appearing and not in need of emergent medical intervention.  Will treat right bacterial conjunctivitis with erythromycin eye ointment.  No blurry vision.  Visual acuity normal today.  Recommended symptom control with over the counter medications: Daily oral anti-histamine, Oral decongestant or IN corticosteroid, saline irrigations, cepacol lozenges,  honey tea.  Patient was prescribed Flonase for bilateral middle ear effusion and was offered cetirizine, but patient wishes to pick this up over-the-counter.  Cough medication prescribed to take as needed.  Advised patient this cough medication can cause  drowsiness.  Rapid strep test was negative today.  Throat culture and COVID-19 viral swab pending.  Return if symptoms fail to improve in 1-2 weeks or you develop shortness of breath, chest pain, severe headache. Patient states understanding and is agreeable.  Discharged with PCP followup.  Final Clinical Impressions(s) / UC Diagnoses   Final diagnoses:  Viral upper respiratory tract infection with cough  Acute bacterial conjunctivitis of right eye  Sore throat  Encounter for laboratory testing for COVID-19 virus     Discharge Instructions      Your rapid strep test was negative.  Throat culture and COVID-19 viral swab are pending.  Your symptoms seem related to a viral respiratory infection.  Suspect the symptoms resolve in the next few days with medications that have been prescribed for you.  Please pick up cetirizine over-the-counter.  Flonase, erythromycin eye ointment, and cough medication have been prescribed for you.  Please be advised that this cough medication can cause drowsiness.  Follow-up with primary care or urgent care if symptoms persist.     ED Prescriptions     Medication Sig Dispense Auth. Provider   fluticasone (FLONASE) 50 MCG/ACT nasal spray Place 1 spray into both nostrils daily. 16 g Odis Luster, FNP   erythromycin ophthalmic ointment Place a 1/2 inch ribbon of ointment into the lower eyelid 4 times for 7 days. 3.5 g Odis Luster, FNP   promethazine-dextromethorphan (PROMETHAZINE-DM) 6.25-15 MG/5ML syrup Take 5 mLs by mouth 4 (four) times daily as needed for cough. 118 mL Odis Luster, FNP  PDMP not reviewed this encounter.   Odis Luster, FNP 10/20/20 1434

## 2020-10-21 LAB — SARS-COV-2, NAA 2 DAY TAT

## 2020-10-21 LAB — NOVEL CORONAVIRUS, NAA: SARS-CoV-2, NAA: NOT DETECTED

## 2020-10-23 LAB — CULTURE, GROUP A STREP (THRC)

## 2020-11-20 DIAGNOSIS — Z6841 Body Mass Index (BMI) 40.0 and over, adult: Secondary | ICD-10-CM | POA: Diagnosis not present

## 2020-11-20 DIAGNOSIS — Z01419 Encounter for gynecological examination (general) (routine) without abnormal findings: Secondary | ICD-10-CM | POA: Diagnosis not present

## 2020-12-23 ENCOUNTER — Other Ambulatory Visit: Payer: Self-pay | Admitting: Physician Assistant

## 2020-12-24 ENCOUNTER — Telehealth: Payer: Self-pay | Admitting: Physician Assistant

## 2020-12-24 DIAGNOSIS — K219 Gastro-esophageal reflux disease without esophagitis: Secondary | ICD-10-CM

## 2020-12-24 NOTE — Telephone Encounter (Signed)
Can you send over her acid reflux medication until she gets in here for her appointment on 12/8?

## 2020-12-25 ENCOUNTER — Telehealth: Payer: Self-pay | Admitting: Physician Assistant

## 2020-12-25 ENCOUNTER — Other Ambulatory Visit: Payer: Self-pay | Admitting: Nurse Practitioner

## 2020-12-25 DIAGNOSIS — K219 Gastro-esophageal reflux disease without esophagitis: Secondary | ICD-10-CM

## 2020-12-25 MED ORDER — PANTOPRAZOLE SODIUM 40 MG PO TBEC: 40.0000 mg | DELAYED_RELEASE_TABLET | Freq: Every day | ORAL | 0 refills | Status: DC

## 2020-12-25 MED ORDER — PANTOPRAZOLE SODIUM 40 MG PO TBEC: 40.0000 mg | DELAYED_RELEASE_TABLET | Freq: Every day | ORAL | 1 refills | Status: DC

## 2020-12-25 NOTE — Telephone Encounter (Signed)
Pt is notified of medication sent in.

## 2020-12-25 NOTE — Telephone Encounter (Signed)
15 tabs have been sent to pharmacy. AS, CMA

## 2020-12-25 NOTE — Telephone Encounter (Signed)
Patient is out of her acid reflux medication and she does have an appointment coming up can you please send in enough to get her through until she comes in? Also she is leaving to go out of town today at 1 pm and would like to get if before because she is starting to be in pain.  [phone]

## 2020-12-25 NOTE — Telephone Encounter (Signed)
Lease let her know that I sent new prescription for her pantoprazole to [facility] on [address]. Thanks so much.   -HB

## 2021-01-02 ENCOUNTER — Other Ambulatory Visit: Payer: Self-pay

## 2021-01-02 ENCOUNTER — Encounter: Payer: Self-pay | Admitting: Physician Assistant

## 2021-01-02 ENCOUNTER — Ambulatory Visit (INDEPENDENT_AMBULATORY_CARE_PROVIDER_SITE_OTHER): Payer: BC Managed Care – PPO | Admitting: Physician Assistant

## 2021-01-02 VITALS — BP 96/62 | HR 60 | Temp 98.1°F | Ht 67.0 in | Wt 259.5 lb

## 2021-01-02 DIAGNOSIS — E0590 Thyrotoxicosis, unspecified without thyrotoxic crisis or storm: Secondary | ICD-10-CM

## 2021-01-02 DIAGNOSIS — E282 Polycystic ovarian syndrome: Secondary | ICD-10-CM

## 2021-01-02 DIAGNOSIS — Z23 Encounter for immunization: Secondary | ICD-10-CM

## 2021-01-02 DIAGNOSIS — Z0000 Encounter for general adult medical examination without abnormal findings: Secondary | ICD-10-CM

## 2021-01-02 DIAGNOSIS — K219 Gastro-esophageal reflux disease without esophagitis: Secondary | ICD-10-CM | POA: Diagnosis not present

## 2021-01-02 MED ORDER — PANTOPRAZOLE SODIUM 40 MG PO TBEC
40.0000 mg | DELAYED_RELEASE_TABLET | Freq: Every day | ORAL | 3 refills | Status: DC
Start: 2021-01-02 — End: 2021-01-14

## 2021-01-02 NOTE — Progress Notes (Signed)
Subjective:     Caitlyn Ramos is a 30 y.o. female and is here for a comprehensive physical exam. The patient reports no problems.  Social History   Socioeconomic History   Marital status: Married    Spouse name: [name]   Number of children: Not on file   Years of education: Not on file   Highest education level: Not on file  Occupational History   Not on file  Tobacco Use   Smoking status: Never   Smokeless tobacco: Never  Vaping Use   Vaping Use: Never used  Substance and Sexual Activity   Alcohol use: No   Drug use: No   Sexual activity: Yes    Partners: Male    Birth control/protection: I.U.D.  Other Topics Concern   Not on file  Social History Narrative   Not on file   Social Determinants of Health   Financial Resource Strain: Not on file  Food Insecurity: Not on file  Transportation Needs: Not on file  Physical Activity: Not on file  Stress: Not on file  Social Connections: Not on file  Intimate Partner Violence: Not on file   Health Maintenance  Topic Date Due   COVID-19 Vaccine (1) Never done   Hepatitis C Screening  Never done   PAP-Cervical Cytology Screening  Never done   PAP SMEAR-Modifier  10/11/2022   TETANUS/TDAP  07/14/2028   INFLUENZA VACCINE  Completed   HIV Screening  Completed   Pneumococcal Vaccine 60-41 Years old  Aged Out   HPV VACCINES  Aged Out    The following portions of the patient's history were reviewed and updated as appropriate: allergies, current medications, past family history, past medical history, past social history, past surgical history, and problem list.  Review of Systems Pertinent items noted in HPI and remainder of comprehensive ROS otherwise negative.   Objective:    BP 96/62   Pulse 60   Temp 98.1 F (36.7 C)   Ht 5\' 7"  (1.702 m)   Wt 259 lb 8 oz (117.7 kg)   SpO2 99%   BMI 40.64 kg/m  General appearance: alert, cooperative, and no distress Head: Normocephalic, without obvious abnormality,  atraumatic Eyes: conjunctivae/corneas clear. PERRL, EOM's intact. Fundi benign. Ears: normal TM's and external ear canals both ears Nose: Nares normal. Septum midline. Mucosa normal. No drainage or sinus tenderness. Throat: lips, mucosa, and tongue normal; teeth and gums normal Neck: no adenopathy, no JVD, and supple, symmetrical, trachea midline Back: symmetric, no curvature. ROM normal. No CVA tenderness. Lungs: clear to auscultation bilaterally Heart: regular rate and rhythm and S1, S2 normal Abdomen: soft, non-tender; bowel sounds normal; no masses,  no organomegaly Extremities: extremities normal, atraumatic, no cyanosis or edema Pulses: 2+ and symmetric Skin: Skin color, texture, turgor normal. No rashes or lesions Lymph nodes: Cervical adenopathy: normal and Supraclavicular adenopathy: normal Neurologic: Grossly normal    Assessment:    Healthy female exam.     Plan:  -Followed by OB/GYN ([facility]) for female exam/pap. Will request records. -Agreeable to influenza vaccine. -Recommend to continue with exercise regimen, calorie count and heart healthy diet. -GERD controlled. Continue current medication regimen. -Schedule lab visit for FBW including thyroid panel and hormone tests.  -Follow up in 1 year for CPE and FBW.  See After Visit Summary for Counseling Recommendations

## 2021-01-02 NOTE — Patient Instructions (Signed)

## 2021-01-06 ENCOUNTER — Other Ambulatory Visit: Payer: Self-pay | Admitting: Physician Assistant

## 2021-01-06 DIAGNOSIS — E0590 Thyrotoxicosis, unspecified without thyrotoxic crisis or storm: Secondary | ICD-10-CM

## 2021-01-06 DIAGNOSIS — Z0000 Encounter for general adult medical examination without abnormal findings: Secondary | ICD-10-CM

## 2021-01-09 ENCOUNTER — Other Ambulatory Visit: Payer: Self-pay

## 2021-01-09 ENCOUNTER — Other Ambulatory Visit: Payer: BC Managed Care – PPO

## 2021-01-09 DIAGNOSIS — Z0000 Encounter for general adult medical examination without abnormal findings: Secondary | ICD-10-CM | POA: Diagnosis not present

## 2021-01-09 DIAGNOSIS — E0590 Thyrotoxicosis, unspecified without thyrotoxic crisis or storm: Secondary | ICD-10-CM

## 2021-01-12 LAB — CBC
Hematocrit: 37.8 % (ref 34.0–46.6)
Hemoglobin: 12.6 g/dL (ref 11.1–15.9)
MCH: 31 pg (ref 26.6–33.0)
MCHC: 33.3 g/dL (ref 31.5–35.7)
MCV: 93 fL (ref 79–97)
Platelets: 271 10*3/uL (ref 150–450)
RBC: 4.07 x10E6/uL (ref 3.77–5.28)
RDW: 12.5 % (ref 11.7–15.4)
WBC: 4.4 10*3/uL (ref 3.4–10.8)

## 2021-01-12 LAB — T3: T3, Total: 84 ng/dL (ref 71–180)

## 2021-01-12 LAB — COMPREHENSIVE METABOLIC PANEL
ALT: 21 IU/L (ref 0–32)
AST: 23 IU/L (ref 0–40)
Albumin/Globulin Ratio: 2 (ref 1.2–2.2)
Albumin: 4.7 g/dL (ref 3.9–5.0)
Alkaline Phosphatase: 62 IU/L (ref 44–121)
BUN/Creatinine Ratio: 20 (ref 9–23)
BUN: 21 mg/dL — ABNORMAL HIGH (ref 6–20)
Bilirubin Total: 0.6 mg/dL (ref 0.0–1.2)
CO2: 23 mmol/L (ref 20–29)
Calcium: 9.5 mg/dL (ref 8.7–10.2)
Chloride: 103 mmol/L (ref 96–106)
Creatinine, Ser: 1.04 mg/dL — ABNORMAL HIGH (ref 0.57–1.00)
Globulin, Total: 2.4 g/dL (ref 1.5–4.5)
Glucose: 82 mg/dL (ref 70–99)
Potassium: 4.2 mmol/L (ref 3.5–5.2)
Sodium: 139 mmol/L (ref 134–144)
Total Protein: 7.1 g/dL (ref 6.0–8.5)
eGFR: 75 mL/min/{1.73_m2} (ref >59)

## 2021-01-12 LAB — LIPID PANEL
Chol/HDL Ratio: 3.2 ratio (ref 0.0–4.4)
Cholesterol, Total: 188 mg/dL (ref 100–199)
HDL: 59 mg/dL (ref >39)
LDL Chol Calc (NIH): 121 mg/dL — ABNORMAL HIGH (ref 0–99)
Triglycerides: 41 mg/dL (ref 0–149)
VLDL Cholesterol Cal: 8 mg/dL (ref 5–40)

## 2021-01-12 LAB — T4, FREE: Free T4: 1.26 ng/dL (ref 0.82–1.77)

## 2021-01-12 LAB — HEMOGLOBIN A1C
Est. average glucose Bld gHb Est-mCnc: 103 mg/dL
Hgb A1c MFr Bld: 5.2 % (ref 4.8–5.6)

## 2021-01-12 LAB — FOLLICLE STIMULATING HORMONE: FSH: 4.6 m[IU]/mL

## 2021-01-12 LAB — TESTOSTERONE: Testosterone: 46 ng/dL (ref 13–71)

## 2021-01-12 LAB — PROGESTERONE: Progesterone: 0.3 ng/mL

## 2021-01-12 LAB — TSH: TSH: 2.16 u[IU]/mL (ref 0.450–4.500)

## 2021-01-12 LAB — ESTROGENS, TOTAL: Estrogen: 470 pg/mL

## 2021-01-14 ENCOUNTER — Other Ambulatory Visit: Payer: Self-pay

## 2021-01-14 ENCOUNTER — Telehealth: Payer: Self-pay | Admitting: Physician Assistant

## 2021-01-14 DIAGNOSIS — K219 Gastro-esophageal reflux disease without esophagitis: Secondary | ICD-10-CM

## 2021-01-14 MED ORDER — PANTOPRAZOLE SODIUM 40 MG PO TBEC: 40.0000 mg | DELAYED_RELEASE_TABLET | Freq: Every day | ORAL | 3 refills | Status: DC

## 2021-01-14 NOTE — Telephone Encounter (Signed)
Patient called requesting refill of acid reflux medication and would like it to go to [facility] on [address]. Please advise [phone]

## 2021-01-14 NOTE — Telephone Encounter (Signed)
Rx sent to correct pharmacy.

## 2021-02-22 DIAGNOSIS — K8020 Calculus of gallbladder without cholecystitis without obstruction: Secondary | ICD-10-CM | POA: Insufficient documentation

## 2021-02-23 ENCOUNTER — Other Ambulatory Visit: Payer: Self-pay

## 2021-02-23 ENCOUNTER — Emergency Department: Payer: BC Managed Care – PPO

## 2021-02-23 ENCOUNTER — Emergency Department
Admission: EM | Admit: 2021-02-23 | Discharge: 2021-02-23 | Disposition: A | Discharge status: Home / Self Care | Payer: BC Managed Care – PPO | Attending: Emergency Medicine | Admitting: Emergency Medicine

## 2021-02-23 ENCOUNTER — Encounter: Payer: Self-pay | Admitting: Emergency Medicine

## 2021-02-23 DIAGNOSIS — N9489 Other specified conditions associated with female genital organs and menstrual cycle: Secondary | ICD-10-CM | POA: Insufficient documentation

## 2021-02-23 DIAGNOSIS — R1011 Right upper quadrant pain: Secondary | ICD-10-CM | POA: Diagnosis not present

## 2021-02-23 DIAGNOSIS — K8020 Calculus of gallbladder without cholecystitis without obstruction: Secondary | ICD-10-CM | POA: Diagnosis not present

## 2021-02-23 DIAGNOSIS — E039 Hypothyroidism, unspecified: Secondary | ICD-10-CM | POA: Insufficient documentation

## 2021-02-23 DIAGNOSIS — R1010 Upper abdominal pain, unspecified: Secondary | ICD-10-CM

## 2021-02-23 DIAGNOSIS — R0789 Other chest pain: Secondary | ICD-10-CM | POA: Diagnosis not present

## 2021-02-23 DIAGNOSIS — Z79899 Other long term (current) drug therapy: Secondary | ICD-10-CM | POA: Diagnosis not present

## 2021-02-23 DIAGNOSIS — K760 Fatty (change of) liver, not elsewhere classified: Secondary | ICD-10-CM | POA: Diagnosis not present

## 2021-02-23 DIAGNOSIS — R079 Chest pain, unspecified: Secondary | ICD-10-CM | POA: Diagnosis not present

## 2021-02-23 LAB — COMPREHENSIVE METABOLIC PANEL
ALT: 18 U/L (ref 0–44)
AST: 17 U/L (ref 15–41)
Albumin: 4.3 g/dL (ref 3.5–5.0)
Alkaline Phosphatase: 39 U/L (ref 38–126)
Anion gap: 9 (ref 5–15)
BUN: 22 mg/dL — ABNORMAL HIGH (ref 6–20)
CO2: 25 mmol/L (ref 22–32)
Calcium: 9.4 mg/dL (ref 8.9–10.3)
Chloride: 102 mmol/L (ref 98–111)
Creatinine, Ser: 0.94 mg/dL (ref 0.44–1.00)
GFR, Estimated: >60 mL/min (ref >60)
Glucose, Bld: 127 mg/dL — ABNORMAL HIGH (ref 70–99)
Potassium: 3.5 mmol/L (ref 3.5–5.1)
Sodium: 136 mmol/L (ref 135–145)
Total Bilirubin: 0.6 mg/dL (ref 0.3–1.2)
Total Protein: 7.2 g/dL (ref 6.5–8.1)

## 2021-02-23 LAB — CBC
HCT: 37.4 % (ref 36.0–46.0)
Hemoglobin: 12.2 g/dL (ref 12.0–15.0)
MCH: 30.7 pg (ref 26.0–34.0)
MCHC: 32.6 g/dL (ref 30.0–36.0)
MCV: 94.2 fL (ref 80.0–100.0)
Platelets: 234 10*3/uL (ref 150–400)
RBC: 3.97 MIL/uL (ref 3.87–5.11)
RDW: 13.2 % (ref 11.5–15.5)
WBC: 7.4 10*3/uL (ref 4.0–10.5)
nRBC: 0 % (ref 0.0–0.2)

## 2021-02-23 LAB — LIPASE, BLOOD: Lipase: 38 U/L (ref 11–51)

## 2021-02-23 LAB — HCG, QUANTITATIVE, PREGNANCY: hCG, Beta Chain, Quant, S: <1 m[IU]/mL (ref <5)

## 2021-02-23 LAB — TROPONIN I (HIGH SENSITIVITY): Troponin I (High Sensitivity): 3 ng/L (ref <18)

## 2021-02-23 MED ORDER — ONDANSETRON 4 MG PO TBDP
4.0000 mg | ORAL_TABLET | Freq: Once | ORAL | Status: AC
  Administered 2021-02-23: 4 mg via ORAL
  Filled 2021-02-23: qty 1

## 2021-02-23 MED ORDER — ONDANSETRON 4 MG PO TBDP
4.0000 mg | ORAL_TABLET | Freq: Once | ORAL | Status: AC | PRN
  Administered 2021-02-23: 4 mg via ORAL
  Filled 2021-02-23: qty 1

## 2021-02-23 MED ORDER — OXYCODONE HCL 5 MG PO CAPS: 5.0000 mg | ORAL_CAPSULE | ORAL | 0 refills | Status: DC | PRN

## 2021-02-23 MED ORDER — ONDANSETRON 4 MG PO TBDP: 4.0000 mg | ORAL_TABLET | Freq: Four times a day (QID) | ORAL | 0 refills | Status: DC | PRN

## 2021-02-23 MED ORDER — OXYCODONE HCL 5 MG PO TABS
5.0000 mg | ORAL_TABLET | Freq: Once | ORAL | Status: AC
  Administered 2021-02-23: 5 mg via ORAL
  Filled 2021-02-23: qty 1

## 2021-02-23 NOTE — ED Notes (Addendum)
Provided pt with water and grape juice to take medication and for fluid challenge. Pt reports pain level 7.5/10 in epigastric area that radiates to back. Pt denies nausea at this time and reports nausea and vomiting flares up when pain intensifies in chest.

## 2021-02-23 NOTE — ED Notes (Signed)
Refused cxr

## 2021-02-23 NOTE — ED Provider Notes (Signed)
[facility] Provider Note    Event Date/Time   First MD Initiated Contact with Patient 02/23/21 [phone]     (approximate)   History   Chest Pain and Emesis   HPI  Caitlyn Ramos is a 31 y.o. female with history of PCOS, hypothyroidism who presents to the emergency department with complaints of intermittent upper abdominal pain for the past 3 months.  States that she thought this was initially acid reflux but has been taking medications and watching her diet without any relief.  States tonight pain worsened and she had multiple episodes of nonbloody, nonbilious emesis.  No fevers.  No diarrhea, dysuria, hematuria, vaginal bleeding or discharge.  She has had previous C-section.  States pain is improved since arriving to the ED but still feeling uncomfortable.  She reports she has lost about 85 pounds over the past 2 years.   History provided by patient.    Past Medical History:  Diagnosis Date   Abdominal pain    Cough    Fibroid    Generalized headaches    Nasal congestion    Nausea    PCOS (polycystic ovarian syndrome)    Sore throat     Past Surgical History:  Procedure Laterality Date   CESAREAN SECTION N/A 08/07/2018   Procedure: CESAREAN SECTION;  Surgeon: Linda Hedges, DO;  Location: [facility] LD ORS;  Service: Obstetrics;  Laterality: N/A;   WISDOM TOOTH EXTRACTION  2009 approximate    MEDICATIONS:  Prior to Admission medications   Medication Sig Start Date End Date Taking? Authorizing Provider  Levonorgestrel (SKYLA) 13.5 MG IUD 1 each by Intrauterine route See admin instructions. Every 3 years    [provider]  pantoprazole (PROTONIX) 40 MG tablet Take 1 tablet (40 mg total) by mouth daily. 01/14/21   Lorrene Reid, PA-C  methimazole (TAPAZOLE) 5 MG tablet Take 1 tablet (5 mg total) by mouth daily. 03/23/19 08/24/19  Philemon Kingdom, MD    Physical Exam   Triage Vital Signs: ED Triage Vitals  Enc Vitals Group     BP 02/23/21 0052  116/76     Pulse Rate 02/23/21 0052 73     Resp 02/23/21 0052 (!) 22     Temp 02/23/21 0052 97.6 F (36.4 C)     Temp Source 02/23/21 0052 Oral     SpO2 02/23/21 0052 100 %     Weight 02/23/21 0052 250 lb (113.4 kg)     Height 02/23/21 0052 5\' 7"  (1.702 m)     Head Circumference --      Peak Flow --      Pain Score 02/23/21 0058 10     Pain Loc --      Pain Edu? --      Excl. in [place]? --     Most recent vital signs: Vitals:   02/23/21 0052 02/23/21 0513  BP: 116/76 107/62  Pulse: 73 70  Resp: (!) 22 20  Temp: 97.6 F (36.4 C)   SpO2: 100% 99%    CONSTITUTIONAL: Alert and oriented and responds appropriately to questions. Well-appearing; well-nourished, afebrile, nontoxic, in no distress HEAD: Normocephalic, atraumatic EYES: Conjunctivae clear, pupils appear equal, sclera nonicteric ENT: normal nose; moist mucous membranes NECK: Supple, normal ROM CARD: RRR; S1 and S2 appreciated; no murmurs, no clicks, no rubs, no gallops RESP: Normal chest excursion without splinting or tachypnea; breath sounds clear and equal bilaterally; no wheezes, no rhonchi, no rales, no hypoxia or respiratory distress, speaking full  sentences ABD/GI: Normal bowel sounds; non-distended; soft, tender in the upper abdomen, right upper quadrant with negative Murphy sign.  No significant tenderness at McBurney's point.  No guarding or rebound. BACK: The back appears normal EXT: Normal ROM in all joints; no deformity noted, no edema; no cyanosis SKIN: Normal color for age and race; warm; no rash on exposed skin NEURO: Moves all extremities equally, normal speech PSYCH: The patient's mood and manner are appropriate.   ED Results / Procedures / Treatments   LABS: (all labs ordered are listed, but only abnormal results are displayed) Labs Reviewed  COMPREHENSIVE METABOLIC PANEL - Abnormal; Notable for the following components:      Result Value   Glucose, Bld 127 (*)    BUN 22 (*)    All other  components within normal limits  CBC  LIPASE, BLOOD  HCG, QUANTITATIVE, PREGNANCY  TROPONIN I (HIGH SENSITIVITY)     EKG:  EKG Interpretation  Date/Time:  Sunday February 23 2021 00:58:50 EST Ventricular Rate:  63 PR Interval:  176 QRS Duration: 98 QT Interval:  424 QTC Calculation: 433 R Axis:   59 Text Interpretation: Normal sinus rhythm Septal infarct , age undetermined Abnormal ECG When compared with ECG of 14-May-2020 20:47, Vent. rate has decreased BY  31 BPM Nonspecific T wave abnormality no longer evident in Lateral leads Confirmed by Pryor Curia [phone]) on 02/23/2021 4:36:31 AM         RADIOLOGY: My personal review and interpretation of imaging: Ultrasound shows cholelithiasis without cholecystitis.  I have personally reviewed all radiology reports.   US ABDOMEN LIMITED RUQ (LIVER/GB)  Result Date: 02/23/2021 CLINICAL DATA:  Right upper quadrant pain EXAM: ULTRASOUND ABDOMEN LIMITED RIGHT UPPER QUADRANT COMPARISON:  None. FINDINGS: Gallbladder: Gallbladder is well distended with gallbladder stone in the region of the neck. No wall thickening or pericholecystic fluid is noted. Mild positive sonographic Murphy's sign is elicited. Common bile duct: Diameter: 4 mm. Liver: Mild fatty infiltration of the liver is noted. Portal vein is patent on color Doppler imaging with normal direction of blood flow towards the liver. Other: None. IMPRESSION: Fatty liver. Cholelithiasis with mild gallbladder tenderness. These changes may represent early cholecystitis although no wall thickening or pericholecystic fluid is noted. HIDA scan may be helpful for further evaluation. Electronically Signed   By: Inez Catalina M.D.   On: 02/23/2021 02:26     PROCEDURES:  Critical Care performed: No     Procedures    IMPRESSION / MDM / ASSESSMENT AND PLAN / ED COURSE  I reviewed the triage vital signs and the nursing notes.    Patient here with complaints of abdominal pain with nausea and  vomiting.     DIFFERENTIAL DIAGNOSIS (includes but not limited to):   Cholelithiasis, cholecystitis, cholangitis, choledocholithiasis, pancreatitis, gastritis, GERD, peptic ulcer disease, H. pylori.  Less likely perforation, colitis, diverticulitis, appendicitis, bowel obstruction.   PLAN: We will obtain CBC, CMP, lipase, hCG, right upper quadrant ultrasound.  Will give pain medication, nausea medication.   MEDICATIONS GIVEN IN ED: Medications  ondansetron (ZOFRAN-ODT) disintegrating tablet 4 mg (4 mg Oral Given 02/23/21 0105)  oxyCODONE (Oxy IR/ROXICODONE) immediate release tablet 5 mg (5 mg Oral Given 02/23/21 0515)  ondansetron (ZOFRAN-ODT) disintegrating tablet 4 mg (4 mg Oral Given 02/23/21 0515)     ED COURSE: Patient's labs show no leukocytosis.  Normal LFTs and lipase.  Right upper quadrant ultrasound shows cholelithiasis without choledocholithiasis, pericholecystic fluid, wall thickening.  She has a negative Murphy sign  on my exam.  Clinically my suspicion for cholecystitis is very low.  She reports she is feeling better.  We will give oral oxycodone, Zofran and reassess.   6:06 AM  Pt reports feeling much better and states pain is now a 4/10.  Have offered further pain medication here which she declines.  She feels comfortable with plan for discharge home with close outpatient follow-up with general surgery.  Again my suspicion for acute cholecystitis is very low given she is afebrile without leukocytosis, and no positive Murphy sign on my exam and no pericholecystic fluid or wall thickening.  Do not feel clinically she needs an emergent HIDA scan or admission to the hospital.  Discussed return precautions at length.  She is comfortable with this plan.  Recommended continued low-fat diet.  Will discharge with pain and nausea medicine.  Given outpatient general surgery follow-up information.   At this time, I do not feel there is any life-threatening condition present. I reviewed all  nursing notes, vitals, pertinent previous records.  All lab and urine results, EKGs, imaging ordered have been independently reviewed and interpreted by myself.  I reviewed all available radiology reports from any imaging ordered this visit.  Based on my assessment, I feel the patient is safe to be discharged home without further emergent workup and can continue workup as an outpatient as needed. Discussed all findings, treatment plan as well as usual and customary return precautions with patient.  They verbalize understanding and are comfortable with this plan.  Outpatient follow-up has been provided as needed.  All questions have been answered.   CONSULTS: No general surgical consultation needed at this time given low suspicion for acute cholecystitis.   OUTSIDE RECORDS REVIEWED: I have reviewed recent records from patient's primary care physician in December 2022.  Patient is on Protonix 40 mg daily for her symptoms.         FINAL CLINICAL IMPRESSION(S) / ED DIAGNOSES   Final diagnoses:  Upper abdominal pain  Gallstones     Rx / DC Orders   ED Discharge Orders          Ordered    oxycodone (OXY-IR) 5 MG capsule  Every 4 hours PRN        02/23/21 0606    ondansetron (ZOFRAN-ODT) 4 MG disintegrating tablet  Every 6 hours PRN        02/23/21 0606             Note:  This document was prepared using Dragon voice recognition software and may include unintentional dictation errors.   Camya Haydon, Delice Bison, DO 02/23/21 [phone]

## 2021-02-23 NOTE — ED Notes (Signed)
Patient transported to Ultrasound 

## 2021-02-23 NOTE — ED Triage Notes (Addendum)
Pt arrived via POV with c/o epigastric pain that started around 8pm. Pt had eaten about 1.5 hours prior to pain starting. Pt has hx of reflux, took Protonix, Mylanta, Tums meds at 830 with no relief.  Pt states the pain goes through to her back.

## 2021-02-23 NOTE — ED Notes (Signed)
Pt reports tolerating grape juice and water well- no nausea or vomiting at this time

## 2021-02-23 NOTE — Discharge Instructions (Addendum)

## 2021-02-24 ENCOUNTER — Other Ambulatory Visit (HOSPITAL_BASED_OUTPATIENT_CLINIC_OR_DEPARTMENT_OTHER): Payer: Self-pay

## 2021-02-24 ENCOUNTER — Other Ambulatory Visit: Payer: Self-pay

## 2021-02-24 ENCOUNTER — Emergency Department (HOSPITAL_BASED_OUTPATIENT_CLINIC_OR_DEPARTMENT_OTHER)
Admission: EM | Admit: 2021-02-24 | Discharge: 2021-02-24 | Disposition: A | Discharge status: Home / Self Care | Payer: BC Managed Care – PPO | Attending: Emergency Medicine | Admitting: Emergency Medicine

## 2021-02-24 ENCOUNTER — Telehealth: Payer: Self-pay | Admitting: Physician Assistant

## 2021-02-24 ENCOUNTER — Encounter (HOSPITAL_BASED_OUTPATIENT_CLINIC_OR_DEPARTMENT_OTHER): Payer: Self-pay

## 2021-02-24 DIAGNOSIS — R1011 Right upper quadrant pain: Secondary | ICD-10-CM

## 2021-02-24 DIAGNOSIS — K8020 Calculus of gallbladder without cholecystitis without obstruction: Secondary | ICD-10-CM | POA: Diagnosis not present

## 2021-02-24 LAB — COMPREHENSIVE METABOLIC PANEL
ALT: 23 U/L (ref 0–44)
AST: 20 U/L (ref 15–41)
Albumin: 4.1 g/dL (ref 3.5–5.0)
Alkaline Phosphatase: 44 U/L (ref 38–126)
Anion gap: 8 (ref 5–15)
BUN: 16 mg/dL (ref 6–20)
CO2: 26 mmol/L (ref 22–32)
Calcium: 8.8 mg/dL — ABNORMAL LOW (ref 8.9–10.3)
Chloride: 101 mmol/L (ref 98–111)
Creatinine, Ser: 0.94 mg/dL (ref 0.44–1.00)
GFR, Estimated: >60 mL/min (ref >60)
Glucose, Bld: 84 mg/dL (ref 70–99)
Potassium: 3.8 mmol/L (ref 3.5–5.1)
Sodium: 135 mmol/L (ref 135–145)
Total Bilirubin: 0.7 mg/dL (ref 0.3–1.2)
Total Protein: 6.9 g/dL (ref 6.5–8.1)

## 2021-02-24 LAB — CBC WITH DIFFERENTIAL/PLATELET
Abs Immature Granulocytes: 0.03 10*3/uL (ref 0.00–0.07)
Basophils Absolute: 0 10*3/uL (ref 0.0–0.1)
Basophils Relative: 0 %
Eosinophils Absolute: 0.1 10*3/uL (ref 0.0–0.5)
Eosinophils Relative: 1 %
HCT: 38.2 % (ref 36.0–46.0)
Hemoglobin: 12.5 g/dL (ref 12.0–15.0)
Immature Granulocytes: 0 %
Lymphocytes Relative: 19 %
Lymphs Abs: 1.7 10*3/uL (ref 0.7–4.0)
MCH: 31.3 pg (ref 26.0–34.0)
MCHC: 32.7 g/dL (ref 30.0–36.0)
MCV: 95.5 fL (ref 80.0–100.0)
Monocytes Absolute: 0.5 10*3/uL (ref 0.1–1.0)
Monocytes Relative: 6 %
Neutro Abs: 6.4 10*3/uL (ref 1.7–7.7)
Neutrophils Relative %: 74 %
Platelets: 233 10*3/uL (ref 150–400)
RBC: 4 MIL/uL (ref 3.87–5.11)
RDW: 13.3 % (ref 11.5–15.5)
WBC: 8.7 10*3/uL (ref 4.0–10.5)
nRBC: 0 % (ref 0.0–0.2)

## 2021-02-24 LAB — LIPASE, BLOOD: Lipase: 32 U/L (ref 11–51)

## 2021-02-24 MED ORDER — ONDANSETRON HCL 4 MG/2ML IJ SOLN
4.0000 mg | Freq: Once | INTRAMUSCULAR | Status: AC
  Administered 2021-02-24: 4 mg via INTRAVENOUS
  Filled 2021-02-24: qty 2

## 2021-02-24 MED ORDER — MORPHINE SULFATE (PF) 4 MG/ML IV SOLN
4.0000 mg | Freq: Once | INTRAVENOUS | Status: AC
  Administered 2021-02-24: 4 mg via INTRAVENOUS
  Filled 2021-02-24: qty 1

## 2021-02-24 MED ORDER — PROCHLORPERAZINE EDISYLATE 10 MG/2ML IJ SOLN
10.0000 mg | Freq: Once | INTRAMUSCULAR | Status: AC
  Administered 2021-02-24: 10 mg via INTRAVENOUS
  Filled 2021-02-24: qty 2

## 2021-02-24 MED ORDER — OXYCODONE HCL 5 MG PO TABS
5.0000 mg | ORAL_TABLET | ORAL | 0 refills | Status: DC | PRN
  Filled 2021-02-24: qty 20, 4d supply, fill #0

## 2021-02-24 MED ORDER — PROMETHAZINE HCL 25 MG PO TABS
25.0000 mg | ORAL_TABLET | Freq: Four times a day (QID) | ORAL | 0 refills | Status: DC | PRN
  Filled 2021-02-24 (×2): qty 28, 7d supply, fill #0

## 2021-02-24 NOTE — ED Triage Notes (Signed)
Pt c/o fever started yesterday-cont'd RUQ/back pain-states she was seen for same at [facility] 2 days ago-dx with gallstones-has appt with surgeon 2/2-states she is concerned with new fever-last tylenol ~830am-states she does not need "another EKG or scan maybe just some blood work"-NAD-steady gait

## 2021-02-24 NOTE — ED Provider Notes (Signed)
[facility] EMERGENCY DEPARTMENT Provider Note   CSN: [id number] Arrival date & time: 02/24/21  1213     History  Chief Complaint  Patient presents with   Abdominal Pain    Caitlyn Ramos is a 31 y.o. female with a pmh of PCOS resenting today with right upper quadrant pain.  Patient was seen at [facility] yesterday and diagnosed with gallstones.  She was prescribed oxycodone however her pharmacy did not have any.  Today she continues to have pain and would like to have the gallstones taken out.  Appointment with surgeon on Thursday.  Reports some subjective fevers at home.  Has been taking Tylenol for this.  Tylenol last taken at 9 AM.  Says that her pain is worse while she is eating, and she has trouble getting comfortable.   Home Medications Prior to Admission medications   Medication Sig Start Date End Date Taking? Authorizing Provider  Levonorgestrel (SKYLA) 13.5 MG IUD 1 each by Intrauterine route See admin instructions. Every 3 years    [provider]  ondansetron (ZOFRAN-ODT) 4 MG disintegrating tablet Take 1 tablet (4 mg total) by mouth every 6 (six) hours as needed for nausea or vomiting. 02/23/21   Ward, Delice Bison, DO  oxycodone (OXY-IR) 5 MG capsule Take 1 capsule (5 mg total) by mouth every 4 (four) hours as needed. 02/23/21   Ward, Delice Bison, DO  pantoprazole (PROTONIX) 40 MG tablet Take 1 tablet (40 mg total) by mouth daily. 01/14/21   Lorrene Reid, PA-C  methimazole (TAPAZOLE) 5 MG tablet Take 1 tablet (5 mg total) by mouth daily. 03/23/19 08/24/19  Philemon Kingdom, MD      Allergies    Patient has no known allergies.    Review of Systems   Review of Systems  Gastrointestinal:  Positive for abdominal pain.   Physical Exam Updated Vital Signs BP 116/60    Pulse 73    Temp 98.5 F (36.9 C) (Oral)    Resp 18    Ht 5\' 7"  (1.702 m)    Wt 115.7 kg    SpO2 100%    BMI 39.94 kg/m  Physical Exam Vitals and nursing note reviewed.  Constitutional:       General: She is not in acute distress.    Appearance: Normal appearance. She is well-developed. She is not ill-appearing.  HENT:     Head: Normocephalic and atraumatic.  Eyes:     General: No scleral icterus.    Conjunctiva/sclera: Conjunctivae normal.  Cardiovascular:     Rate and Rhythm: Normal rate and regular rhythm.  Pulmonary:     Effort: Pulmonary effort is normal. No respiratory distress.     Breath sounds: No wheezing or rales.  Abdominal:     General: Abdomen is flat.     Tenderness: There is abdominal tenderness in the right upper quadrant. Positive signs include Murphy's sign.  Skin:    General: Skin is warm and dry.     Findings: No rash.  Neurological:     Mental Status: She is alert.  Psychiatric:        Mood and Affect: Mood normal.    ED Results / Procedures / Treatments   Labs (all labs ordered are listed, but only abnormal results are displayed) Labs Reviewed - No data to display  EKG None  Radiology US ABDOMEN LIMITED RUQ (LIVER/GB)  Result Date: 02/23/2021 CLINICAL DATA:  Right upper quadrant pain EXAM: ULTRASOUND ABDOMEN LIMITED RIGHT UPPER QUADRANT COMPARISON:  None.  FINDINGS: Gallbladder: Gallbladder is well distended with gallbladder stone in the region of the neck. No wall thickening or pericholecystic fluid is noted. Mild positive sonographic Murphy's sign is elicited. Common bile duct: Diameter: 4 mm. Liver: Mild fatty infiltration of the liver is noted. Portal vein is patent on color Doppler imaging with normal direction of blood flow towards the liver. Other: None. IMPRESSION: Fatty liver. Cholelithiasis with mild gallbladder tenderness. These changes may represent early cholecystitis although no wall thickening or pericholecystic fluid is noted. HIDA scan may be helpful for further evaluation. Electronically Signed   By: Inez Catalina M.D.   On: 02/23/2021 02:26    Procedures Procedures    Medications Ordered in ED Medications - No data to  display  ED Course/ Medical Decision Making/ A&P                           Medical Decision Making Amount and/or Complexity of Data Reviewed Labs: ordered.  Risk Prescription drug management.   Patient presents to the ED for concern of abdominal pain from her diagnosed gallstones yesterday.  Able to get her oxycodone and reports that she has been having increased pain and fevers.   Co morbidities that complicate the patient evaluation  Elevated BMI  Additional history obtained:  Additional history obtained from chart review.  I was able to review patient's visit in the ED yesterday as well as her ultrasound that showed gallstones.   Lab Tests:  I Ordered, and personally interpreted labs.  There are no pertinent results   Medicines ordered and prescription drug management:  I ordered medication including morphine for pain and Zofran for nausea.  She reports that this decreased her pain level to about a 3 however she continued to be nauseous.  Given promethazine.  This somewhat improved her symptoms.  -She was unable to pick up her oxycodone after discharge yesterday.  This has been sent to our outpatient pharmacy.  Phenergan suppositories also sent.   Test Considered:  Right upper quadrant ultrasound.  I decided that because the patient recently had 1, there is not likely to be changed due to her normal white count. Afebrile   Problem List / ED Course:  Pain from gallstones  Reevaluation:  Patient reports that medications helped her symptoms.  She is requesting discharge because she feels as though we are going in circles.  She is afebrile, without a white count and scheduled for outpatient follow-up with surgery on Thursday.  I believe it is reasonable to discharge her home with return precautions per her request.  She left prior to p.o. challenge.  Final Clinical Impression(s) / ED Diagnoses Final diagnoses:  RUQ abdominal pain  Gallstones    Rx / DC  Orders Results and diagnoses were explained to the patient. Return precautions discussed in full. Patient had no additional questions and expressed complete understanding.   This chart was dictated using voice recognition software.  Despite best efforts to proofread,  errors can occur which can change the documentation meaning.      Darliss Ridgel 02/24/21 1740    Gareth Morgan, MD 02/27/21 2204

## 2021-02-24 NOTE — Telephone Encounter (Signed)
Patient called and stated that Saturday night she went to ER and they diagnosed her with gallstones, pt does has appt scheduled with surgeon on Thursday, since Saturday night has developed fever and now is concerned.Is this something she needs to come in here for or go to ER she doesn't want to sit there for 8 hours if unnecessary. One of her children had a fever so she's on the fence if it is serious. Can you please advise. [phone]

## 2021-02-24 NOTE — Discharge Instructions (Addendum)
Your prescription medications are at the outpatient pharmacy.  Please keep your appointment with general surgery on Thursday.  You may use the Zofran and Phenergan suppositories at the same time.  Read the information about gallstones attached to these papers.

## 2021-02-24 NOTE — Telephone Encounter (Signed)
Called patient.  Unable to reach her. Left a voicemail for her to callback

## 2021-02-26 DIAGNOSIS — K8010 Calculus of gallbladder with chronic cholecystitis without obstruction: Secondary | ICD-10-CM

## 2021-02-26 HISTORY — DX: Calculus of gallbladder with chronic cholecystitis without obstruction: K80.10

## 2021-02-27 ENCOUNTER — Telehealth: Payer: Self-pay | Admitting: Surgery

## 2021-02-27 ENCOUNTER — Ambulatory Visit: Payer: Self-pay | Admitting: Surgery

## 2021-02-27 ENCOUNTER — Other Ambulatory Visit: Payer: Self-pay

## 2021-02-27 ENCOUNTER — Encounter: Payer: Self-pay | Admitting: Surgery

## 2021-02-27 ENCOUNTER — Ambulatory Visit: Payer: BC Managed Care – PPO | Admitting: Surgery

## 2021-02-27 VITALS — BP 132/84 | HR 76 | Temp 97.9°F | Ht 67.0 in | Wt 252.4 lb

## 2021-02-27 DIAGNOSIS — K8010 Calculus of gallbladder with chronic cholecystitis without obstruction: Secondary | ICD-10-CM

## 2021-02-27 NOTE — H&P (View-Only) (Signed)
Patient ID: Caitlyn Ramos, female   DOB: 03/03/1990, 31 y.o.   MRN: [id number]  Chief Complaint: Gallstones  History of Present Illness Caitlyn Ramos is a 31 y.o. female with with a history of progressive gallbladder attacks.  Unfortunately was previously diagnosed as reflux.  She has a fatty food intolerance, her most severe attack involves vomiting to retching right upper quadrant pain which radiates to the midline epigastrium back and right shoulder.  She reports waiting in the ER over 8 hours to the point of resolution of her acute pain prior to being seen.  She denies any history of jaundice or acholic stools.  She has had no fevers or chills.  She is at a point where even a minimalist of fatty foods will cause her to have discomfort.  She has had whole-wheat toast with egg whites and still will provoke her right upper quadrant pain.  Past Medical History Past Medical History:  Diagnosis Date   Abdominal pain    Cough    Fibroid    Generalized headaches    Nasal congestion    Nausea    PCOS (polycystic ovarian syndrome)    Sore throat       Past Surgical History:  Procedure Laterality Date   CESAREAN SECTION N/A 08/07/2018   Procedure: CESAREAN SECTION;  Surgeon: Linda Hedges, DO;  Location: [facility] LD ORS;  Service: Obstetrics;  Laterality: N/A;   WISDOM TOOTH EXTRACTION  2009 approximate    No Known Allergies  Current Outpatient Medications  Medication Sig Dispense Refill   Levonorgestrel (SKYLA) 13.5 MG IUD 1 each by Intrauterine route See admin instructions. Every 3 years     ondansetron (ZOFRAN-ODT) 4 MG disintegrating tablet Take 1 tablet (4 mg total) by mouth every 6 (six) hours as needed for nausea or vomiting. 20 tablet 0   oxyCODONE (OXY IR/ROXICODONE) 5 MG immediate release tablet Take 1 tablet (5 mg total) by mouth every 4 (four) hours as needed. 20 tablet 0   pantoprazole (PROTONIX) 40 MG tablet Take 1 tablet (40 mg total) by mouth daily. 90 tablet 3   promethazine  (PHENERGAN) 25 MG tablet Take 1 tablet (25 mg total) by mouth every 6 (six) hours as needed for up to 7 days for nausea or vomiting. 28 tablet 0   No current facility-administered medications for this visit.    Family History Family History  Problem Relation Age of Onset   Heart disease Father    Hypertension Father    Thyroid disease Father    Cancer Maternal Grandfather        lung   Early death Maternal Grandfather    Alcohol abuse Maternal Grandmother    COPD Maternal Grandmother    Cancer Paternal Grandmother    Early death Paternal Grandmother    Kidney disease Paternal Grandmother    Diabetes Paternal Grandfather       Social History Social History   Tobacco Use   Smoking status: Never   Smokeless tobacco: Never  Vaping Use   Vaping Use: Never used  Substance Use Topics   Alcohol use: No   Drug use: No        Review of Systems  Constitutional:  Positive for fever and weight loss.  HENT: Negative.    Eyes: Negative.   Respiratory: Negative.    Cardiovascular: Negative.   Gastrointestinal:  Positive for abdominal pain, constipation, heartburn, nausea and vomiting.  Genitourinary: Negative.   Skin: Negative.   Neurological: Negative.   Psychiatric/Behavioral:  Negative.       Physical Exam Blood pressure 132/84, pulse 76, temperature 97.9 F (36.6 C), temperature source Oral, height 5\' 7"  (1.702 m), weight 252 lb 6.4 oz (114.5 kg), SpO2 95 %. Last Weight  Most recent update: 02/27/2021  1:52 PM    Weight  114.5 kg (252 lb 6.4 oz)             CONSTITUTIONAL: Well developed, and nourished, appropriately responsive and aware without distress.   EYES: Sclera non-icteric.   EARS, NOSE, MOUTH AND THROAT: Mask worn.   The oropharynx is clear. Oral mucosa is pink and moist.    Hearing is intact to voice.  NECK: Trachea is midline, and there is no jugular venous distension.  LYMPH NODES:  Lymph nodes in the neck are not enlarged. RESPIRATORY:  Lungs are  clear, and breath sounds are equal bilaterally. Normal respiratory effort without pathologic use of accessory muscles. CARDIOVASCULAR: Heart is regular in rate and rhythm. GI: The abdomen is soft, nontender, and nondistended. There were no palpable masses. I did not appreciate hepatosplenomegaly. There were normal bowel sounds. MUSCULOSKELETAL:  Symmetrical muscle tone appreciated in all four extremities.    SKIN: Skin turgor is normal. No pathologic skin lesions appreciated.  NEUROLOGIC:  Motor and sensation appear grossly normal.  Cranial nerves are grossly without defect. PSYCH:  Alert and oriented to person, place and time. Affect is appropriate for situation.  Data Reviewed I have personally reviewed what is currently available of the patient's imaging, recent labs and medical records.   Labs:  CBC Latest Ref Rng & Units 02/24/2021 02/23/2021 01/09/2021  WBC 4.0 - 10.5 K/uL 8.7 7.4 4.4  Hemoglobin 12.0 - 15.0 g/dL 12.5 12.2 12.6  Hematocrit 36.0 - 46.0 % 38.2 37.4 37.8  Platelets 150 - 400 K/uL 233 234 271   CMP Latest Ref Rng & Units 02/24/2021 02/23/2021 01/09/2021  Glucose 70 - 99 mg/dL 84 127(H) 82  BUN 6 - 20 mg/dL 16 22(H) 21(H)  Creatinine 0.44 - 1.00 mg/dL 0.94 0.94 1.04(H)  Sodium 135 - 145 mmol/L 135 136 139  Potassium 3.5 - 5.1 mmol/L 3.8 3.5 4.2  Chloride 98 - 111 mmol/L 101 102 103  CO2 22 - 32 mmol/L 26 25 23   Calcium 8.9 - 10.3 mg/dL 8.8(L) 9.4 9.5  Total Protein 6.5 - 8.1 g/dL 6.9 7.2 7.1  Total Bilirubin 0.3 - 1.2 mg/dL 0.7 0.6 0.6  Alkaline Phos 38 - 126 U/L 44 39 62  AST 15 - 41 U/L 20 17 23   ALT 0 - 44 U/L 23 18 21     Imaging: Radiology review:  CLINICAL DATA:  Right upper quadrant pain   EXAM: ULTRASOUND ABDOMEN LIMITED RIGHT UPPER QUADRANT   COMPARISON:  None.   FINDINGS: Gallbladder:   Gallbladder is well distended with gallbladder stone in the region of the neck. No wall thickening or pericholecystic fluid is noted. Mild positive sonographic  Murphy's sign is elicited.   Common bile duct:   Diameter: 4 mm.   Liver:   Mild fatty infiltration of the liver is noted. Portal vein is patent on color Doppler imaging with normal direction of blood flow towards the liver.   Other: None.   IMPRESSION: Fatty liver.   Cholelithiasis with mild gallbladder tenderness. These changes may represent early cholecystitis although no wall thickening or pericholecystic fluid is noted. HIDA scan may be helpful for further evaluation.     Electronically Signed   By: Linus Mako.D.  On: 02/23/2021 02:26 Within last 24 hrs: No results found.  Assessment     Patient Active Problem List   Diagnosis Date Noted   CCC (chronic calculous cholecystitis) 02/27/2021   Polycystic ovary syndrome 12/04/2019   Thyrotoxicosis without thyroid storm 12/16/2018   S/P cesarean section 08/08/2018   Preterm premature rupture of membranes (PPROM) with unknown onset of labor 08/07/2018   Preterm labor 07/18/2018   Amenorrhea 08/25/2017   Health care maintenance 07/14/2016   Vaginal yeast infection 07/14/2016   Obesity (BMI 35.0-39.9 without comorbidity) 07/14/2016    Plan    Robotic cholecystectomy with ICG imaging. This was discussed thoroughly.  Optimal plan is for robotic cholecystectomy.  Risks and benefits have been discussed with the patient which include but are not limited to anesthesia, bleeding, infection, biliary ductal injury or stenosis, other associated unanticipated injuries affiliated with laparoscopic surgery.  I believe there is the desire to proceed, interpreter utilized as needed.  Questions elicited and answered to satisfaction.  No guarantees ever expressed or implied.   Face-to-face time spent with the patient and accompanying care providers(if present) was 30 minutes, with more than 50% of the time spent counseling, educating, and coordinating care of the patient.    These notes generated with voice recognition software.  I apologize for typographical errors.  Ronny Bacon M.D., FACS 02/27/2021, 2:55 PM

## 2021-02-27 NOTE — Telephone Encounter (Signed)
Patient has been advised of Pre-Admission date/time, COVID Testing date and Surgery date.  Surgery Date: 03/07/21 Preadmission Testing Date: 03/05/21 (phone 1p-5p) Covid Testing Date: Not needed.     Patient has been made aware to call [phone], between 1-3:00pm the day before surgery, to find out what time to arrive for surgery.

## 2021-02-27 NOTE — Patient Instructions (Signed)
Our surgery scheduler Pamala Hurry will call you within 24-48 hours to get you scheduled. If you have not heard from her after 48 hours, please call our office. You will not need to get Covid tested before surgery and have the blue sheet available when she calls to write down important information.   If you have any concerns or questions, please feel free to call our office.  Minimally Invasive Cholecystectomy Minimally invasive cholecystectomy is surgery to remove the gallbladder. The gallbladder is a pear-shaped organ that lies beneath the liver on the right side of the body. The gallbladder stores bile, which is a fluid that helps the body digest fats. Cholecystectomy is often done to treat inflammation (irritation and swelling) of the gallbladder (cholecystitis). This condition is usually caused by a buildup of gallstones (cholelithiasis) in the gallbladder or when the fluid in the gall bladder becomes stagnant because gallstones get stuck in the ducts (tubes) and block the flow of bile. This can result in inflammation and pain. In severe cases, emergency surgery may be required. This procedure is done through small incisions in the abdomen, instead of one large incision. It is also called laparoscopic surgery. A thin scope with a camera (laparoscope) is inserted through one incision. Then surgical instruments are inserted through the other incisions. In some cases, a minimally invasive surgery may need to be changed to a surgery that is done through a larger incision. This is called open surgery. Tell a health care provider about: Any allergies you have. All medicines you are taking, including vitamins, herbs, eye drops, creams, and over-the-counter medicines. Any problems you or family members have had with anesthetic medicines. Any bleeding problems you have. Any surgeries you have had. Any medical conditions you have. Whether you are pregnant or may be pregnant. What are the risks? Generally,  this is a safe procedure. However, problems may occur, including: Infection. Bleeding. Allergic reactions to medicines. Damage to nearby structures or organs. A gallstone remaining in the common bile duct. The common bile duct carries bile from the gallbladder to the small intestine. A bile leak from the liver or cystic duct after your gallbladder is removed. What happens before the procedure? When to stop eating and drinking Follow instructions from your health care provider about what you may eat and drink before your procedure. These may include: 8 hours before the procedure Stop eating most foods. Do not eat meat, fried foods, or fatty foods. Eat only light foods, such as toast or crackers. All liquids are okay except energy drinks and alcohol. 6 hours before the procedure Stop eating. Drink only clear liquids, such as water, clear fruit juice, black coffee, plain tea, and sports drinks. Do not drink energy drinks or alcohol. 2 hours before the procedure Stop drinking all liquids. You may be allowed to take medicines with small sips of water. If you do not follow your health care provider's instructions, your procedure may be delayed or canceled. Medicines Ask your health care provider about: Changing or stopping your regular medicines. This is especially important if you are taking diabetes medicines or blood thinners. Taking medicines such as aspirin and ibuprofen. These medicines can thin your blood. Do not take these medicines unless your health care provider tells you to take them. Taking over-the-counter medicines, vitamins, herbs, and supplements. General instructions If you will be going home right after the procedure, plan to have a responsible adult: Take you home from the hospital or clinic. You will not be allowed to drive.  Care for you for the time you are told. Do not use any products that contain nicotine or tobacco for at least 4 weeks before the procedure. These  products include cigarettes, chewing tobacco, and vaping devices, such as e-cigarettes. If you need help quitting, ask your health care provider. Ask your health care provider: How your surgery site will be marked. What steps will be taken to help prevent infection. These may include: Removing hair at the surgery site. Washing skin with a germ-killing soap. Taking antibiotic medicine. What happens during the procedure?  An IV will be inserted into one of your veins. You will be given one or both of the following: A medicine to help you relax (sedative). A medicine to make you fall asleep (general anesthetic). Your surgeon will make several small incisions in your abdomen. The laparoscope will be inserted through one of the small incisions. The camera on the laparoscope will send images to a monitor in the operating room. This lets your surgeon see inside your abdomen. A gas will be pumped into your abdomen. This will expand your abdomen to give the surgeon more room to perform the surgery. Other tools that are needed for the procedure will be inserted through the other incisions. The gallbladder will be removed through one of the incisions. Your common bile duct may be examined. If stones are found in the common bile duct, they may be removed. After your gallbladder has been removed, the incisions will be closed with stitches (sutures), staples, or skin glue. Your incisions will be covered with a bandage (dressing). The procedure may vary among health care providers and hospitals. What happens after the procedure? Your blood pressure, heart rate, breathing rate, and blood oxygen level will be monitored until you leave the hospital or clinic. You will be given medicines as needed to control your pain. You may have a drain placed in the incision. The drain will be removed a day or two after the procedure. Summary Minimally invasive cholecystectomy, also called laparoscopic cholecystectomy, is  surgery to remove the gallbladder using small incisions. Tell your health care provider about all the medical conditions you have and all the medicines you are taking for those conditions. Before the procedure, follow instructions about when to stop eating and drinking and changing or stopping medicines. Plan to have a responsible adult care for you for the time you are told after you leave the hospital or clinic. This information is not intended to replace advice given to you by your health care provider. Make sure you discuss any questions you have with your health care provider.  Cholelithiasis Cholelithiasis happens when gallstones form in the gallbladder. The gallbladder stores bile. Bile is a fluid that helps digest fats. Bile can harden and form into gallstones. If they cause a blockage, they can cause pain (gallbladder attack). What are the causes? This condition may be caused by: Some blood diseases, such as sickle cell anemia. Too much of a fat-like substance (cholesterol) in your bile. Not enough bile salts in your bile. These salts help the body absorb and digest fats. The gallbladder not emptying fully or often enough. This is common in pregnant women. What increases the risk? The following factors may make you more likely to develop this condition: Being female. Being pregnant many times. Eating a lot of fried foods, fat, and refined carbs (refined carbohydrates). Being very overweight (obese). Being older than age 57. Using medicines with female hormones in them for a long time. Losing weight  fast. Having gallstones in your family. Having some health problems, such as diabetes, Crohn's disease, or liver disease. What are the signs or symptoms? Often, there may be gallstones but no symptoms. These gallstones are called silent gallstones. If a gallstone causes a blockage, you may get sudden pain. The pain: Can be in the upper right part of your belly (abdomen). Normally comes  at night or after you eat. Can last an hour or more. Can spread to your right shoulder, back, or chest. Can feel like discomfort, burning, or fullness in the upper part of your belly (indigestion). If the blockage lasts more than a few hours, you can get an infection or swelling. You may: Feel like you may vomit. Vomit. Feel bloated. Have belly pain for 5 hours or more. Feel tender in your belly, often in the upper right part and under your ribs. Have fever or chills. Have skin or the white parts of your eyes turn yellow (jaundice). Have dark pee (urine) or pale poop (stool). How is this treated? Treatment for this condition depends on how bad you feel. If you have symptoms, you may need: Home care, if symptoms are not very bad. Do not eat for 12-24 hours. Drink only water and clear liquids. Start to eat simple or clear foods after 1 or 2 days. Try broths and crackers. You may need medicines for pain or stomach upset or both. If you have an infection, you will need antibiotics. A hospital stay, if you have very bad pain or a very bad infection. Surgery to remove your gallbladder. You may need this if: Gallstones keep coming back. You have very bad symptoms. Medicines to break up gallstones. Medicines: Are best for small gallstones. May be used for up to 6-12 months. A procedure to find and take out gallstones or to break up gallstones. Follow these instructions at home: Medicines Take over-the-counter and prescription medicines only as told by your doctor. If you were prescribed an antibiotic medicine, take it as told by your doctor. Do not stop taking the antibiotic even if you start to feel better. Ask your doctor if the medicine prescribed to you requires you to avoid driving or using machinery. Eating and drinking Drink enough fluid to keep your urine pale yellow. Drink water or clear fluids. This is important when you have pain. Eat healthy foods. Choose: Fewer fatty foods,  such as fried foods. Fewer refined carbs. Avoid breads and grains that are highly processed, such as white bread and white rice. Choose whole grains, such as whole-wheat bread and brown rice. More fiber. Almonds, fresh fruit, and beans are healthy sources. General instructions Keep a healthy weight. Keep all follow-up visits as told by your doctor. This is important. Where to find more information [employer] of Diabetes and Digestive and Kidney Diseases: [email or web address] Contact a doctor if: You have sudden pain in the upper right part of your belly. Pain might spread to your right shoulder, back, or chest. You have been diagnosed with gallstones that have no symptoms and you get: Belly pain. Discomfort, burning, or fullness in the upper part of your abdomen. You have dark urine or pale stools. Get help right away if: You have sudden pain in the upper right part of your abdomen, and the pain lasts more than 2 hours. You have pain in your abdomen, and: It lasts more than 5 hours. It keeps getting worse. You have a fever or chills. You keep feeling like you may vomit. You keep  vomiting. Your skin or the white parts of your eyes turn yellow. Summary Cholelithiasis happens when gallstones form in the gallbladder. This condition may be caused by a blood disease, too much of a fat-like substance in the bile, or not enough bile salts in bile. Treatment for this condition depends on how bad you feel. If you have symptoms, do not eat or drink. You may need medicines. You may need a hospital stay for very bad pain or a very bad infection. You may need surgery if gallstones keep coming back or if you have very bad symptoms. This information is not intended to replace advice given to you by your health care provider. Make sure you discuss any questions you have with your health care provider. Document Revised: 03/03/2019 Document Reviewed: 12/05/2018 Elsevier Patient Education  2022  [employer].

## 2021-02-27 NOTE — Progress Notes (Signed)
Patient ID: Caitlyn Ramos, female   DOB: 02/01/1990, 31 y.o.   MRN: [id number]  Chief Complaint: Gallstones  History of Present Illness Caitlyn Ramos is a 31 y.o. female with with a history of progressive gallbladder attacks.  Unfortunately was previously diagnosed as reflux.  She has a fatty food intolerance, her most severe attack involves vomiting to retching right upper quadrant pain which radiates to the midline epigastrium back and right shoulder.  She reports waiting in the ER over 8 hours to the point of resolution of her acute pain prior to being seen.  She denies any history of jaundice or acholic stools.  She has had no fevers or chills.  She is at a point where even a minimalist of fatty foods will cause her to have discomfort.  She has had whole-wheat toast with egg whites and still will provoke her right upper quadrant pain.  Past Medical History Past Medical History:  Diagnosis Date   Abdominal pain    Cough    Fibroid    Generalized headaches    Nasal congestion    Nausea    PCOS (polycystic ovarian syndrome)    Sore throat       Past Surgical History:  Procedure Laterality Date   CESAREAN SECTION N/A 08/07/2018   Procedure: CESAREAN SECTION;  Surgeon: Linda Hedges, DO;  Location: [facility];  Service: Obstetrics;  Laterality: N/A;   WISDOM TOOTH EXTRACTION  2009 approximate    No Known Allergies  Current Outpatient Medications  Medication Sig Dispense Refill   Levonorgestrel (SKYLA) 13.5 MG IUD 1 each by Intrauterine route See admin instructions. Every 3 years     ondansetron (ZOFRAN-ODT) 4 MG disintegrating tablet Take 1 tablet (4 mg total) by mouth every 6 (six) hours as needed for nausea or vomiting. 20 tablet 0   oxyCODONE (OXY IR/ROXICODONE) 5 MG immediate release tablet Take 1 tablet (5 mg total) by mouth every 4 (four) hours as needed. 20 tablet 0   pantoprazole (PROTONIX) 40 MG tablet Take 1 tablet (40 mg total) by mouth daily. 90 tablet 3   promethazine  (PHENERGAN) 25 MG tablet Take 1 tablet (25 mg total) by mouth every 6 (six) hours as needed for up to 7 days for nausea or vomiting. 28 tablet 0   No current facility-administered medications for this visit.    Family History Family History  Problem Relation Age of Onset   Heart disease Father    Hypertension Father    Thyroid disease Father    Cancer Maternal Grandfather        lung   Early death Maternal Grandfather    Alcohol abuse Maternal Grandmother    COPD Maternal Grandmother    Cancer Paternal Grandmother    Early death Paternal Grandmother    Kidney disease Paternal Grandmother    Diabetes Paternal Grandfather       Social History Social History   Tobacco Use   Smoking status: Never   Smokeless tobacco: Never  Vaping Use   Vaping Use: Never used  Substance Use Topics   Alcohol use: No   Drug use: No        Review of Systems  Constitutional:  Positive for fever and weight loss.  HENT: Negative.    Eyes: Negative.   Respiratory: Negative.    Cardiovascular: Negative.   Gastrointestinal:  Positive for abdominal pain, constipation, heartburn, nausea and vomiting.  Genitourinary: Negative.   Skin: Negative.   Neurological: Negative.   Psychiatric/Behavioral:  Negative.       Physical Exam Blood pressure 132/84, pulse 76, temperature 97.9 F (36.6 C), temperature source Oral, height 5\' 7"  (1.702 m), weight 252 lb 6.4 oz (114.5 kg), SpO2 95 %. Last Weight  Most recent update: 02/27/2021  1:52 PM    Weight  114.5 kg (252 lb 6.4 oz)             CONSTITUTIONAL: Well developed, and nourished, appropriately responsive and aware without distress.   EYES: Sclera non-icteric.   EARS, NOSE, MOUTH AND THROAT: Mask worn.   The oropharynx is clear. Oral mucosa is pink and moist.    Hearing is intact to voice.  NECK: Trachea is midline, and there is no jugular venous distension.  LYMPH NODES:  Lymph nodes in the neck are not enlarged. RESPIRATORY:  Lungs are  clear, and breath sounds are equal bilaterally. Normal respiratory effort without pathologic use of accessory muscles. CARDIOVASCULAR: Heart is regular in rate and rhythm. GI: The abdomen is soft, nontender, and nondistended. There were no palpable masses. I did not appreciate hepatosplenomegaly. There were normal bowel sounds. MUSCULOSKELETAL:  Symmetrical muscle tone appreciated in all four extremities.    SKIN: Skin turgor is normal. No pathologic skin lesions appreciated.  NEUROLOGIC:  Motor and sensation appear grossly normal.  Cranial nerves are grossly without defect. PSYCH:  Alert and oriented to person, place and time. Affect is appropriate for situation.  Data Reviewed I have personally reviewed what is currently available of the patient's imaging, recent labs and medical records.   Labs:  CBC Latest Ref Rng & Units 02/24/2021 02/23/2021 01/09/2021  WBC 4.0 - 10.5 K/uL 8.7 7.4 4.4  Hemoglobin 12.0 - 15.0 g/dL 12.5 12.2 12.6  Hematocrit 36.0 - 46.0 % 38.2 37.4 37.8  Platelets 150 - 400 K/uL 233 234 271   CMP Latest Ref Rng & Units 02/24/2021 02/23/2021 01/09/2021  Glucose 70 - 99 mg/dL 84 127(H) 82  BUN 6 - 20 mg/dL 16 22(H) 21(H)  Creatinine 0.44 - 1.00 mg/dL 0.94 0.94 1.04(H)  Sodium 135 - 145 mmol/L 135 136 139  Potassium 3.5 - 5.1 mmol/L 3.8 3.5 4.2  Chloride 98 - 111 mmol/L 101 102 103  CO2 22 - 32 mmol/L 26 25 23   Calcium 8.9 - 10.3 mg/dL 8.8(L) 9.4 9.5  Total Protein 6.5 - 8.1 g/dL 6.9 7.2 7.1  Total Bilirubin 0.3 - 1.2 mg/dL 0.7 0.6 0.6  Alkaline Phos 38 - 126 U/L 44 39 62  AST 15 - 41 U/L 20 17 23   ALT 0 - 44 U/L 23 18 21     Imaging: Radiology review:  CLINICAL DATA:  Right upper quadrant pain   EXAM: ULTRASOUND ABDOMEN LIMITED RIGHT UPPER QUADRANT   COMPARISON:  None.   FINDINGS: Gallbladder:   Gallbladder is well distended with gallbladder stone in the region of the neck. No wall thickening or pericholecystic fluid is noted. Mild positive sonographic  Murphy's sign is elicited.   Common bile duct:   Diameter: 4 mm.   Liver:   Mild fatty infiltration of the liver is noted. Portal vein is patent on color Doppler imaging with normal direction of blood flow towards the liver.   Other: None.   IMPRESSION: Fatty liver.   Cholelithiasis with mild gallbladder tenderness. These changes may represent early cholecystitis although no wall thickening or pericholecystic fluid is noted. HIDA scan may be helpful for further evaluation.     Electronically Signed   By: Linus Mako.D.  On: 02/23/2021 02:26 Within last 24 hrs: No results found.  Assessment     Patient Active Problem List   Diagnosis Date Noted   CCC (chronic calculous cholecystitis) 02/27/2021   Polycystic ovary syndrome 12/04/2019   Thyrotoxicosis without thyroid storm 12/16/2018   S/P cesarean section 08/08/2018   Preterm premature rupture of membranes (PPROM) with unknown onset of labor 08/07/2018   Preterm labor 07/18/2018   Amenorrhea 08/25/2017   Health care maintenance 07/14/2016   Vaginal yeast infection 07/14/2016   Obesity (BMI 35.0-39.9 without comorbidity) 07/14/2016    Plan    Robotic cholecystectomy with ICG imaging. This was discussed thoroughly.  Optimal plan is for robotic cholecystectomy.  Risks and benefits have been discussed with the patient which include but are not limited to anesthesia, bleeding, infection, biliary ductal injury or stenosis, other associated unanticipated injuries affiliated with laparoscopic surgery.  I believe there is the desire to proceed, interpreter utilized as needed.  Questions elicited and answered to satisfaction.  No guarantees ever expressed or implied.   Face-to-face time spent with the patient and accompanying care providers(if present) was 30 minutes, with more than 50% of the time spent counseling, educating, and coordinating care of the patient.    These notes generated with voice recognition software.  I apologize for typographical errors.  Ronny Bacon M.D., FACS 02/27/2021, 2:55 PM

## 2021-03-05 ENCOUNTER — Encounter
Admission: RE | Admit: 2021-03-05 | Discharge: 2021-03-05 | Disposition: A | Discharge status: Home / Self Care | Payer: BC Managed Care – PPO | Source: Ambulatory Visit | Attending: Surgery | Admitting: Surgery

## 2021-03-05 ENCOUNTER — Other Ambulatory Visit: Payer: Self-pay

## 2021-03-05 NOTE — Patient Instructions (Addendum)
Your procedure is scheduled on: Friday, February 10 Report to the Registration Desk on the 1st floor of the [facility]. To find out your arrival time, please call [phone] between 1PM - 3PM on: Thursday, February 9  REMEMBER: Instructions that are not followed completely may result in serious medical risk, up to and including death; or upon the discretion of your surgeon and anesthesiologist your surgery may need to be rescheduled.  Do not eat food after midnight the night before surgery.  No gum chewing, lozengers or hard candies.  You may however, drink CLEAR liquids up to 2 hours before you are scheduled to arrive for your surgery. Do not drink anything within 2 hours of your scheduled arrival time.  Clear liquids include: - water  - apple juice without pulp - gatorade (not RED, PURPLE, OR BLUE) - black coffee or tea (Do NOT add milk or creamers to the coffee or tea) Do NOT drink anything that is not on this list.  DO NOT TAKE ANY MEDICATIONS THE MORNING OF SURGERY   One week prior to surgery: Stop Anti-inflammatories (NSAIDS) such as Advil, Aleve, Ibuprofen, Motrin, Naproxen, Naprosyn and Aspirin based products such as Excedrin, Goodys Powder, BC Powder. Stop ANY OVER THE COUNTER supplements until after surgery. You may however, continue to take Tylenol if needed for pain up until the day of surgery.  No Alcohol for 24 hours before or after surgery.  No Smoking including e-cigarettes for 24 hours prior to surgery.  No chewable tobacco products for at least 6 hours prior to surgery.  No nicotine patches on the day of surgery.  Do not use any "recreational" drugs for at least a week prior to your surgery.  Please be advised that the combination of cocaine and anesthesia may have negative outcomes, up to and including death. If you test positive for cocaine, your surgery will be cancelled.  On the morning of surgery brush your teeth with toothpaste and water, you may  rinse your mouth with mouthwash if you wish. Do not swallow any toothpaste or mouthwash.  Use CHG Soap as directed on instruction sheet.  Do not wear jewelry, make-up, hairpins, clips or nail polish.  Do not wear lotions, powders, or perfumes.   Do not shave body from the neck down 48 hours prior to surgery just in case you cut yourself which could leave a site for infection.  Also, freshly shaved skin may become irritated if using the CHG soap.  Contact lenses, hearing aids and dentures may not be worn into surgery.  Do not bring valuables to the hospital. [facility] is not responsible for any missing/lost belongings or valuables.   Notify your doctor if there is any change in your medical condition (cold, fever, infection).  Wear comfortable clothing (specific to your surgery type) to the hospital.  After surgery, you can help prevent lung complications by doing breathing exercises.  Take deep breaths and cough every 1-2 hours. Your doctor may order a device called an Incentive Spirometer to help you take deep breaths. When coughing or sneezing, hold a pillow firmly against your incision with both hands. This is called splinting. Doing this helps protect your incision. It also decreases belly discomfort.  If you are being discharged the day of surgery, you will not be allowed to drive home. You will need a responsible adult (18 years or older) to drive you home and stay with you that night.   If you are taking public transportation, you  will need to have a responsible adult (18 years or older) with you. Please confirm with your physician that it is acceptable to use public transportation.   Please call the [place] Dept[facility] at [phone] if you have any questions about these instructions.  Surgery Visitation Policy:  Patients undergoing a surgery or procedure may have one family member or support person with them as long as that person is not COVID-19 positive or  experiencing its symptoms.  That person may remain in the waiting area during the procedure and may rotate out with other people.

## 2021-03-07 ENCOUNTER — Other Ambulatory Visit: Payer: Self-pay

## 2021-03-07 ENCOUNTER — Ambulatory Visit: Payer: BC Managed Care – PPO | Admitting: Anesthesiology

## 2021-03-07 ENCOUNTER — Encounter: Payer: Self-pay | Admitting: Surgery

## 2021-03-07 ENCOUNTER — Encounter
Admission: RE | Disposition: A | Discharge status: Home / Self Care | Payer: Self-pay | Source: Home / Self Care | Attending: Surgery

## 2021-03-07 ENCOUNTER — Ambulatory Visit
Admission: RE | Admit: 2021-03-07 | Discharge: 2021-03-07 | Disposition: A | Discharge status: Home / Self Care | Payer: BC Managed Care – PPO | Attending: Surgery | Admitting: Surgery

## 2021-03-07 DIAGNOSIS — E282 Polycystic ovarian syndrome: Secondary | ICD-10-CM | POA: Diagnosis not present

## 2021-03-07 DIAGNOSIS — K760 Fatty (change of) liver, not elsewhere classified: Secondary | ICD-10-CM | POA: Insufficient documentation

## 2021-03-07 DIAGNOSIS — Z79899 Other long term (current) drug therapy: Secondary | ICD-10-CM | POA: Diagnosis not present

## 2021-03-07 DIAGNOSIS — Z793 Long term (current) use of hormonal contraceptives: Secondary | ICD-10-CM | POA: Diagnosis not present

## 2021-03-07 DIAGNOSIS — K8012 Calculus of gallbladder with acute and chronic cholecystitis without obstruction: Secondary | ICD-10-CM | POA: Diagnosis not present

## 2021-03-07 DIAGNOSIS — K8010 Calculus of gallbladder with chronic cholecystitis without obstruction: Secondary | ICD-10-CM | POA: Diagnosis not present

## 2021-03-07 LAB — POCT PREGNANCY, URINE: Preg Test, Ur: NEGATIVE

## 2021-03-07 SURGERY — CHOLECYSTECTOMY, ROBOT-ASSISTED, LAPAROSCOPIC
Anesthesia: General | Site: Abdomen

## 2021-03-07 MED ORDER — OXYCODONE HCL 5 MG PO TABS
ORAL_TABLET | ORAL | Status: AC
  Filled 2021-03-07: qty 1

## 2021-03-07 MED ORDER — LIDOCAINE HCL (PF) 2 % IJ SOLN
INTRAMUSCULAR | Status: AC
  Filled 2021-03-07: qty 5

## 2021-03-07 MED ORDER — PROPOFOL 10 MG/ML IV BOLUS
INTRAVENOUS | Status: DC | PRN
  Administered 2021-03-07: 200 mg via INTRAVENOUS
  Administered 2021-03-07: 50 mg via INTRAVENOUS

## 2021-03-07 MED ORDER — HYDROMORPHONE HCL 1 MG/ML IJ SOLN
INTRAMUSCULAR | Status: AC
  Filled 2021-03-07: qty 1

## 2021-03-07 MED ORDER — GLYCOPYRROLATE 0.2 MG/ML IJ SOLN
0.2000 mg | Freq: Once | INTRAMUSCULAR | Status: AC
Start: 2021-03-07 — End: 2021-03-07

## 2021-03-07 MED ORDER — OXYCODONE HCL 5 MG PO TABS: 5.0000 mg | ORAL_TABLET | Freq: Four times a day (QID) | ORAL | 0 refills | Status: DC | PRN

## 2021-03-07 MED ORDER — ONDANSETRON HCL 4 MG/2ML IJ SOLN
4.0000 mg | Freq: Once | INTRAMUSCULAR | Status: AC | PRN
  Administered 2021-03-07: 4 mg via INTRAVENOUS

## 2021-03-07 MED ORDER — OXYCODONE HCL 5 MG PO TABS
5.0000 mg | ORAL_TABLET | Freq: Once | ORAL | Status: AC
  Administered 2021-03-07: 5 mg via ORAL

## 2021-03-07 MED ORDER — CHLORHEXIDINE GLUCONATE 0.12 % MT SOLN
15.0000 mL | Freq: Once | OROMUCOSAL | Status: AC
  Administered 2021-03-07: 15 mL via OROMUCOSAL

## 2021-03-07 MED ORDER — PROPOFOL 10 MG/ML IV BOLUS
INTRAVENOUS | Status: AC
  Filled 2021-03-07: qty 20

## 2021-03-07 MED ORDER — CELECOXIB 200 MG PO CAPS
200.0000 mg | ORAL_CAPSULE | ORAL | Status: AC
  Administered 2021-03-07: 200 mg via ORAL

## 2021-03-07 MED ORDER — DEXAMETHASONE SODIUM PHOSPHATE 10 MG/ML IJ SOLN
INTRAMUSCULAR | Status: AC
  Filled 2021-03-07: qty 1

## 2021-03-07 MED ORDER — FENTANYL CITRATE (PF) 100 MCG/2ML IJ SOLN
INTRAMUSCULAR | Status: AC
  Filled 2021-03-07: qty 2

## 2021-03-07 MED ORDER — HYDROMORPHONE HCL 1 MG/ML IJ SOLN
INTRAMUSCULAR | Status: DC | PRN
  Administered 2021-03-07 (×2): .5 mg via INTRAVENOUS

## 2021-03-07 MED ORDER — BUPIVACAINE LIPOSOME 1.3 % IJ SUSP: 20.0000 mL | Freq: Once | INTRAMUSCULAR | Status: DC

## 2021-03-07 MED ORDER — BUPIVACAINE-EPINEPHRINE (PF) 0.25% -1:200000 IJ SOLN
INTRAMUSCULAR | Status: AC
  Filled 2021-03-07: qty 30

## 2021-03-07 MED ORDER — BUPIVACAINE-EPINEPHRINE (PF) 0.25% -1:200000 IJ SOLN
INTRAMUSCULAR | Status: DC | PRN
  Administered 2021-03-07: 30 mL

## 2021-03-07 MED ORDER — DEXAMETHASONE SODIUM PHOSPHATE 10 MG/ML IJ SOLN
INTRAMUSCULAR | Status: DC | PRN
  Administered 2021-03-07: 10 mg via INTRAVENOUS

## 2021-03-07 MED ORDER — FENTANYL CITRATE (PF) 100 MCG/2ML IJ SOLN
25.0000 ug | INTRAMUSCULAR | Status: DC | PRN
  Administered 2021-03-07 (×2): 25 ug via INTRAVENOUS

## 2021-03-07 MED ORDER — ONDANSETRON HCL 4 MG/2ML IJ SOLN
INTRAMUSCULAR | Status: AC
  Filled 2021-03-07: qty 2

## 2021-03-07 MED ORDER — PHENYLEPHRINE 40 MCG/ML (10ML) SYRINGE FOR IV PUSH (FOR BLOOD PRESSURE SUPPORT)
PREFILLED_SYRINGE | INTRAVENOUS | Status: DC | PRN
  Administered 2021-03-07 (×2): 160 ug via INTRAVENOUS

## 2021-03-07 MED ORDER — 0.9 % SODIUM CHLORIDE (POUR BTL) OPTIME
TOPICAL | Status: DC | PRN
  Administered 2021-03-07: 500 mL

## 2021-03-07 MED ORDER — KETAMINE HCL 50 MG/5ML IJ SOSY
PREFILLED_SYRINGE | INTRAMUSCULAR | Status: AC
  Filled 2021-03-07: qty 5

## 2021-03-07 MED ORDER — BUPIVACAINE LIPOSOME 1.3 % IJ SUSP
INTRAMUSCULAR | Status: AC
  Filled 2021-03-07: qty 20

## 2021-03-07 MED ORDER — BUPIVACAINE LIPOSOME 1.3 % IJ SUSP
INTRAMUSCULAR | Status: DC | PRN
  Administered 2021-03-07: 20 mL

## 2021-03-07 MED ORDER — LIDOCAINE HCL (CARDIAC) PF 100 MG/5ML IV SOSY
PREFILLED_SYRINGE | INTRAVENOUS | Status: DC | PRN
Start: 2021-03-07 — End: 2021-03-07
  Administered 2021-03-07: 80 mg via INTRAVENOUS

## 2021-03-07 MED ORDER — SUGAMMADEX SODIUM 200 MG/2ML IV SOLN
INTRAVENOUS | Status: DC | PRN
  Administered 2021-03-07: 200 mg via INTRAVENOUS

## 2021-03-07 MED ORDER — ORAL CARE MOUTH RINSE: 15.0000 mL | Freq: Once | OROMUCOSAL | Status: AC

## 2021-03-07 MED ORDER — MIDAZOLAM HCL 2 MG/2ML IJ SOLN
INTRAMUSCULAR | Status: DC | PRN
  Administered 2021-03-07 (×2): 1 mg via INTRAVENOUS
  Administered 2021-03-07: 2 mg via INTRAVENOUS

## 2021-03-07 MED ORDER — PROPOFOL 500 MG/50ML IV EMUL
INTRAVENOUS | Status: DC | PRN
  Administered 2021-03-07: 200 ug/kg/min via INTRAVENOUS

## 2021-03-07 MED ORDER — GLYCOPYRROLATE 0.2 MG/ML IJ SOLN
INTRAMUSCULAR | Status: AC
  Administered 2021-03-07: 0.2 mg via INTRAVENOUS
  Filled 2021-03-07: qty 1

## 2021-03-07 MED ORDER — CHLORHEXIDINE GLUCONATE CLOTH 2 % EX PADS
6.0000 | MEDICATED_PAD | Freq: Once | CUTANEOUS | Status: AC
  Administered 2021-03-07: 6 via TOPICAL

## 2021-03-07 MED ORDER — ROCURONIUM BROMIDE 10 MG/ML (PF) SYRINGE
PREFILLED_SYRINGE | INTRAVENOUS | Status: AC
  Filled 2021-03-07: qty 10

## 2021-03-07 MED ORDER — LACTATED RINGERS IV SOLN: INTRAVENOUS | Status: DC

## 2021-03-07 MED ORDER — DEXMEDETOMIDINE (PRECEDEX) IN NS 20 MCG/5ML (4 MCG/ML) IV SYRINGE
PREFILLED_SYRINGE | INTRAVENOUS | Status: DC | PRN
  Administered 2021-03-07 (×2): 10 ug via INTRAVENOUS

## 2021-03-07 MED ORDER — MIDAZOLAM HCL 2 MG/2ML IJ SOLN
INTRAMUSCULAR | Status: AC
  Filled 2021-03-07: qty 2

## 2021-03-07 MED ORDER — INDOCYANINE GREEN 25 MG IV SOLR
2.5000 mg | Freq: Once | INTRAVENOUS | Status: AC
  Administered 2021-03-07: 2.5 mg via INTRAVENOUS
  Filled 2021-03-07: qty 1

## 2021-03-07 MED ORDER — ACETAMINOPHEN 500 MG PO TABS
1000.0000 mg | ORAL_TABLET | ORAL | Status: AC
  Administered 2021-03-07: 1000 mg via ORAL

## 2021-03-07 MED ORDER — KETAMINE HCL 10 MG/ML IJ SOLN
INTRAMUSCULAR | Status: DC | PRN
Start: 2021-03-07 — End: 2021-03-07
  Administered 2021-03-07: 20 mg via INTRAVENOUS

## 2021-03-07 MED ORDER — FENTANYL CITRATE (PF) 100 MCG/2ML IJ SOLN
INTRAMUSCULAR | Status: DC | PRN
  Administered 2021-03-07 (×2): 50 ug via INTRAVENOUS

## 2021-03-07 MED ORDER — PROPOFOL 500 MG/50ML IV EMUL
INTRAVENOUS | Status: AC
  Filled 2021-03-07: qty 50

## 2021-03-07 MED ORDER — DEXMEDETOMIDINE (PRECEDEX) IN NS 20 MCG/5ML (4 MCG/ML) IV SYRINGE
PREFILLED_SYRINGE | INTRAVENOUS | Status: AC
  Filled 2021-03-07: qty 5

## 2021-03-07 MED ORDER — CEFAZOLIN SODIUM-DEXTROSE 2-4 GM/100ML-% IV SOLN
2.0000 g | INTRAVENOUS | Status: AC
  Administered 2021-03-07: 2 g via INTRAVENOUS

## 2021-03-07 MED ORDER — FENTANYL CITRATE (PF) 100 MCG/2ML IJ SOLN
INTRAMUSCULAR | Status: AC
  Administered 2021-03-07: 25 ug via INTRAVENOUS
  Filled 2021-03-07: qty 2

## 2021-03-07 MED ORDER — FAMOTIDINE 20 MG PO TABS
20.0000 mg | ORAL_TABLET | Freq: Once | ORAL | Status: AC
  Administered 2021-03-07: 20 mg via ORAL

## 2021-03-07 MED ORDER — APREPITANT 40 MG PO CAPS
ORAL_CAPSULE | ORAL | Status: AC
  Filled 2021-03-07: qty 1

## 2021-03-07 MED ORDER — ONDANSETRON HCL 4 MG/2ML IJ SOLN
INTRAMUSCULAR | Status: DC | PRN
Start: 2021-03-07 — End: 2021-03-07
  Administered 2021-03-07: 4 mg via INTRAVENOUS

## 2021-03-07 MED ORDER — PROPOFOL 1000 MG/100ML IV EMUL
INTRAVENOUS | Status: AC
  Filled 2021-03-07: qty 300

## 2021-03-07 MED ORDER — GABAPENTIN 300 MG PO CAPS
300.0000 mg | ORAL_CAPSULE | ORAL | Status: AC
  Administered 2021-03-07: 300 mg via ORAL

## 2021-03-07 MED ORDER — ROCURONIUM BROMIDE 100 MG/10ML IV SOLN
INTRAVENOUS | Status: DC | PRN
  Administered 2021-03-07: 10 mg via INTRAVENOUS
  Administered 2021-03-07: 50 mg via INTRAVENOUS

## 2021-03-07 SURGICAL SUPPLY — 44 items
"PENCIL ELECTRO HAND CTR " (MISCELLANEOUS) ×1 IMPLANT
CANNULA CAP OBTURATR AIRSEAL 8 (CAP) ×2 IMPLANT
CLIP LIGATING HEM O LOK PURPLE (MISCELLANEOUS) ×2 IMPLANT
COVER TIP SHEARS 8 DVNC (MISCELLANEOUS) ×1 IMPLANT
COVER TIP SHEARS 8MM DA VINCI (MISCELLANEOUS) ×1
DERMABOND ADVANCED (GAUZE/BANDAGES/DRESSINGS) ×1
DERMABOND ADVANCED .7 DNX12 (GAUZE/BANDAGES/DRESSINGS) ×1 IMPLANT
DRAPE ARM DVNC X/XI (DISPOSABLE) ×4 IMPLANT
DRAPE COLUMN DVNC XI (DISPOSABLE) ×1 IMPLANT
DRAPE DA VINCI XI ARM (DISPOSABLE) ×4
DRAPE DA VINCI XI COLUMN (DISPOSABLE) ×1
ELECT CAUTERY BLADE 6.4 (BLADE) ×2 IMPLANT
GLOVE SURG ORTHO LTX SZ7.5 (GLOVE) ×4 IMPLANT
GOWN STRL REUS W/ TWL LRG LVL3 (GOWN DISPOSABLE) ×4 IMPLANT
GOWN STRL REUS W/TWL LRG LVL3 (GOWN DISPOSABLE) ×4
GRASPER SUT TROCAR 14GX15 (MISCELLANEOUS) IMPLANT
INFUSOR MANOMETER BAG 3000ML (MISCELLANEOUS) IMPLANT
IRRIGATION STRYKERFLOW (MISCELLANEOUS) IMPLANT
IRRIGATOR STRYKERFLOW (MISCELLANEOUS)
IRRIGATOR SUCT 8 DISP DVNC XI (IRRIGATION / IRRIGATOR) IMPLANT
IRRIGATOR SUCTION 8MM XI DISP (IRRIGATION / IRRIGATOR)
IV NS IRRIG 3000ML ARTHROMATIC (IV SOLUTION) IMPLANT
KIT PINK PAD W/HEAD ARE REST (MISCELLANEOUS) ×2
KIT PINK PAD W/HEAD ARM REST (MISCELLANEOUS) ×1 IMPLANT
KIT TURNOVER KIT A (KITS) ×2 IMPLANT
LABEL OR SOLS (LABEL) ×2 IMPLANT
MANIFOLD NEPTUNE II (INSTRUMENTS) ×2 IMPLANT
NDL INSUFFLATION 14GA 120MM (NEEDLE) IMPLANT
NEEDLE HYPO 22GX1.5 SAFETY (NEEDLE) ×2 IMPLANT
NEEDLE INSUFFLATION 14GA 120MM (NEEDLE) IMPLANT
NS IRRIG 500ML POUR BTL (IV SOLUTION) ×2 IMPLANT
PACK LAP CHOLECYSTECTOMY (MISCELLANEOUS) ×2 IMPLANT
PENCIL ELECTRO HAND CTR (MISCELLANEOUS) ×2 IMPLANT
POUCH SPECIMEN RETRIEVAL 10MM (ENDOMECHANICALS) ×2 IMPLANT
SEAL CANN UNIV 5-8 DVNC XI (MISCELLANEOUS) ×3 IMPLANT
SEAL XI 5MM-8MM UNIVERSAL (MISCELLANEOUS) ×3
SET TUBE FILTERED XL AIRSEAL (SET/KITS/TRAYS/PACK) ×2 IMPLANT
SOLUTION ELECTROLUBE (MISCELLANEOUS) ×2 IMPLANT
SUT MNCRL 4-0 (SUTURE) ×1
SUT MNCRL 4-0 27XMFL (SUTURE) ×1
SUT VICRYL 0 AB UR-6 (SUTURE) ×2 IMPLANT
SUTURE MNCRL 4-0 27XMF (SUTURE) ×1 IMPLANT
TROCAR Z-THREAD FIOS 11X100 BL (TROCAR) IMPLANT
WATER STERILE IRR 500ML POUR (IV SOLUTION) ×2 IMPLANT

## 2021-03-07 NOTE — Anesthesia Postprocedure Evaluation (Signed)
Anesthesia Post Note  Patient: JANN MILKOVICH  Procedure(s) Performed: XI ROBOTIC ASSISTED LAPAROSCOPIC CHOLECYSTECTOMY (Abdomen) INDOCYANINE GREEN FLUORESCENCE IMAGING (ICG) (Abdomen)  Patient location during evaluation: PACU Anesthesia Type: General Level of consciousness: awake and alert Pain management: pain level controlled Vital Signs Assessment: post-procedure vital signs reviewed and stable Respiratory status: spontaneous breathing, nonlabored ventilation, respiratory function stable and patient connected to nasal cannula oxygen Cardiovascular status: blood pressure returned to baseline and stable Postop Assessment: no apparent nausea or vomiting Anesthetic complications: no   No notable events documented.   Last Vitals:  Vitals:   03/07/21 1430 03/07/21 1445  BP: 94/65 121/74  Pulse: (!) 51 (!) 46  Resp: 11 14  Temp: (!) 36.2 C (!) 36.3 C  SpO2: 98% 100%    Last Pain:  Vitals:   03/07/21 1445  PainSc: [place] Rena

## 2021-03-07 NOTE — Interval H&P Note (Signed)
History and Physical Interval Note:  03/07/2021 11:28 AM  [name]  has presented today for surgery, with the diagnosis of chronic calculous cholecystitis.  The various methods of treatment have been discussed with the patient and family. After consideration of risks, benefits and other options for treatment, the patient has consented to  Procedure(s): XI ROBOTIC ASSISTED LAPAROSCOPIC CHOLECYSTECTOMY (N/A) [place] (ICG) (N/A) as a surgical intervention.  The patient's history has been reviewed, patient examined, no change in status, stable for surgery.  I have reviewed the patient's chart and labs.  Questions were answered to the patient's satisfaction.     Caitlyn Ramos

## 2021-03-07 NOTE — Progress Notes (Signed)
Patient slow to awaken from surgery, vague nasuea, medicated as ordered.  TIVA anesthesia.

## 2021-03-07 NOTE — Op Note (Signed)
Robotic cholecystectomy with Indocyamine Green Ductal Imaging.   Pre-operative Diagnosis: Chronic calculus cholecystitis  Post-operative Diagnosis:  Same.  Procedure: Robotic assisted laparoscopic cholecystectomy with Indocyamine Green Ductal Imaging.   Surgeon: Ronny Bacon, M.D., FACS  Anesthesia: General. with endotracheal tube  Findings: As expected large single stone, excellent visualization of ductal anatomy with ICG imaging.  Estimated Blood Loss: 10 mL         Drains: None         Specimens: Gallbladder           Complications: none  Procedure Details  The patient was seen again in the Holding Room.  2.5 mg dose of ICG was administered intravenously.   The benefits, complications, treatment options, risks and expected outcomes were again reviewed with the patient. The likelihood of improving the patient's symptoms with return to their baseline status is good.  The patient and/or family concurred with the proposed plan, giving informed consent, again alternatives reviewed.  The patient was taken to Operating Room, identified, and the procedure verified as robotic assisted laparoscopic cholecystectomy.  Prior to the induction of general anesthesia, antibiotic prophylaxis was administered. VTE prophylaxis was in place. General endotracheal anesthesia was then administered and tolerated well. The patient was positioned in the supine position.  After the induction, the abdomen was prepped with Chloraprep and draped in the sterile fashion.  A Time Out was held and the above information confirmed.  After local infiltration of quarter percent Marcaine with epinephrine, stab incision was made left upper quadrant.  Just below the costal margin at Palmer's point, approximately midclavicular line the Veres needle is passed with sensation of the layers to penetrate the abdominal wall and into the peritoneum.  Saline drop test is confirmed peritoneal placement.  Insufflation is initiated  with carbon dioxide to pressures of 15 mmHg.  Right infra-umbilical local infiltration with quarter percent Marcaine with epinephrine is utilized.  Made a 12 mm incision on the right periumbilical site, I advanced an optical 5mm port under direct visualization into the peritoneal cavity.  Once the peritoneum was penetrated, insufflation was initiated.  The trocar was then advanced into the abdominal cavity under direct visualization. Pneumoperitoneum was then continued with Air seal utilizing CO2 at 15 mmHg or less and tolerated well without any adverse changes in the patient's vital signs.  Two 8.5-mm ports were placed in the left lower quadrant and laterally, and one to the right lower quadrant, all under direct vision. All skin incisions  were infiltrated with a local anesthetic agent before making the incision and placing the trocars.  The patient was positioned  in reverse Trendelenburg, tilted the patient's left side down.  Da Vinci XI robot was then positioned on to the patient's left side, and docked.  The gallbladder was identified, the fundus grasped via the arm 4 Prograsp and retracted cephalad. Adhesions were lysed with scissors and cautery.  The infundibulum was identified grasped and retracted laterally, exposing the peritoneum overlying the triangle of Calot. This was then opened and dissected using cautery & scissors. An extended critical view of the cystic duct and cystic artery was obtained, aided by the ICG via FireFly which enabled ready visualization of the ductal anatomy.    The cystic duct was clearly identified and dissected to isolation.   Artery well isolated and clipped, and the cystic duct was triple clipped and divided with scissors, as close to the gallbladder neck as feasible, thus leaving two on the remaining stump.  The specimen side  of the artery is sealed with bipolar and divided with monopolar scissors.    The gallbladder was taken from the gallbladder fossa in a  retrograde fashion with the electrocautery. The gallbladder was removed and placed in an Endocatch bag.  The liver bed is inspected. Hemostasis was confirmed.  The robot was undocked and moved away from the operative field. No irrigation was utilized.  The gallbladder and Endocatch sac were then removed through the infraumbilical port site.   Inspection of the right upper quadrant was performed. No bleeding, bile duct injury or leak, or bowel injury was noted. The infra-umbilical port site fascia was closed with interrumpted 0 Vicryl sutures using PMI/cone under direct visualization. Pneumoperitoneum was released and ports removed.  4-0 subcuticular Monocryl was used to close the skin. Dermabond was  applied.  The patient was then extubated and brought to the recovery room in stable condition. Sponge, lap, and needle counts were correct at closure and at the conclusion of the case.                Ronny Bacon, M.D., [facility] 03/07/2021 2:15 PM

## 2021-03-07 NOTE — Discharge Instructions (Signed)

## 2021-03-07 NOTE — Anesthesia Preprocedure Evaluation (Signed)
Anesthesia Evaluation  Patient identified by MRN, date of birth, ID band Patient awake    Reviewed: Allergy & Precautions, H&P , NPO status , Patient's Chart, lab work & pertinent test results, reviewed documented beta blocker date and time   Airway Mallampati: II  TM Distance: >3 FB Neck ROM: full    Dental  (+) Teeth Intact   Pulmonary neg pulmonary ROS,    Pulmonary exam normal        Cardiovascular negative cardio ROS Normal cardiovascular exam Rhythm:regular Rate:Normal     Neuro/Psych  Headaches, negative psych ROS   GI/Hepatic negative GI ROS, Neg liver ROS,   Endo/Other  Hyperthyroidism   Renal/GU negative Renal ROS  negative genitourinary   Musculoskeletal   Abdominal   Peds  Hematology negative hematology ROS (+)   Anesthesia Other Findings Past Medical History: 02/2021: Chronic cholecystitis with calculus No date: Fibroid No date: Generalized headaches 2020: Hyperthyroidism     Comment:  after pregnancy; took medication for 6 months No date: PCOS (polycystic ovarian syndrome) Past Surgical History: 08/07/2018: CESAREAN SECTION; N/A     Comment:  Procedure: CESAREAN SECTION;  Surgeon: Linda Hedges,               DO;  Location: [facility] LD ORS;  Service: Obstetrics;                Laterality: N/A; 2009 approximate: WISDOM TOOTH EXTRACTION   Reproductive/Obstetrics negative OB ROS                             Anesthesia Physical Anesthesia Plan  ASA: 2  Anesthesia Plan: General ETT   Post-op Pain Management:    Induction:   PONV Risk Score and Plan:   Airway Management Planned:   Additional Equipment:   Intra-op Plan:   Post-operative Plan:   Informed Consent: I have reviewed the patients History and Physical, chart, labs and discussed the procedure including the risks, benefits and alternatives for the proposed anesthesia with the patient or authorized  representative who has indicated his/her understanding and acceptance.     Dental Advisory Given  Plan Discussed with: CRNA  Anesthesia Plan Comments:         Anesthesia Quick Evaluation

## 2021-03-07 NOTE — Transfer of Care (Signed)
Immediate Anesthesia Transfer of Care Note  Patient: Caitlyn Ramos  Procedure(s) Performed: XI ROBOTIC ASSISTED LAPAROSCOPIC CHOLECYSTECTOMY (Abdomen) INDOCYANINE GREEN FLUORESCENCE IMAGING (ICG) (Abdomen)  Patient Location: PACU  Anesthesia Type:General  Level of Consciousness: awake  Airway & Oxygen Therapy: Patient Spontanous Breathing and aerosol face mask  Post-op Assessment: Report given to RN and Post -op Vital signs reviewed and stable  Post vital signs: Reviewed and stable  Last Vitals:  Vitals Value Taken Time  BP 103/55 03/07/21 1345  Temp    Pulse 90 03/07/21 1351  Resp 14 03/07/21 1351  SpO2 100 % 03/07/21 1351  Vitals shown include unvalidated device data.  Last Pain:  Vitals:   03/07/21 1119  PainSc: 0-No pain         Complications: No notable events documented.

## 2021-03-07 NOTE — Anesthesia Procedure Notes (Signed)
Procedure Name: Intubation Date/Time: 03/07/2021 12:28 PM Performed by: Loletha Grayer, CRNA Pre-anesthesia Checklist: Patient identified, Patient being monitored, Timeout performed, Emergency Drugs available and Suction available Patient Re-evaluated:Patient Re-evaluated prior to induction Oxygen Delivery Method: Circle system utilized Preoxygenation: Pre-oxygenation with 100% oxygen Induction Type: IV induction Ventilation: Mask ventilation without difficulty Laryngoscope Size: Mac and 3 Grade View: Grade I Tube type: Oral Tube size: 7.0 mm Number of attempts: 1 Airway Equipment and Method: Stylet Placement Confirmation: ETT inserted through vocal cords under direct vision, positive ETCO2 and breath sounds checked- equal and bilateral Secured at: 21 cm Tube secured with: Tape Dental Injury: Teeth and Oropharynx as per pre-operative assessment

## 2021-03-10 LAB — SURGICAL PATHOLOGY

## 2021-03-11 ENCOUNTER — Encounter: Payer: Self-pay | Admitting: Surgery

## 2021-03-11 ENCOUNTER — Ambulatory Visit (INDEPENDENT_AMBULATORY_CARE_PROVIDER_SITE_OTHER): Payer: BC Managed Care – PPO | Admitting: Surgery

## 2021-03-11 ENCOUNTER — Other Ambulatory Visit: Payer: Self-pay

## 2021-03-11 VITALS — BP 113/61 | HR 85 | Temp 97.7°F | Ht 67.0 in | Wt 254.2 lb

## 2021-03-11 DIAGNOSIS — Z9049 Acquired absence of other specified parts of digestive tract: Secondary | ICD-10-CM | POA: Insufficient documentation

## 2021-03-11 DIAGNOSIS — Z09 Encounter for follow-up examination after completed treatment for conditions other than malignant neoplasm: Secondary | ICD-10-CM

## 2021-03-11 NOTE — Progress Notes (Signed)
[facility] POST-OP OFFICE VISIT  03/11/2021  HPI: Caitlyn Ramos is a 31 y.o. female 4 days s/p robotic cholecystectomy.  She reports she has not had a bowel movement postop.  She complains of pain that seems to be exacerbated with changes in positioning.  She finds a reclining position to be the most comfortable when supported.  She denies nausea, vomiting, fevers and chills.  She reports she is drinking well and voiding clear urine.  She has taken some prune juice and a pair and attempt to move her bowels.  Preoperatively she also had some constipation did not take but MiraLAX and had a think 1 movement prior to surgery.  She is concerned that there may be some other cause of pain as her pain is mostly right upper quadrant and radiating around to the back.  She is avoiding taking narcotics.  She has not taken ibuprofen.  Vital signs: There were no vitals taken for this visit.   Physical Exam: Constitutional: She appears well, nontoxic. Abdomen: Right upper quadrant is soft and nontender, she breathes deeply without exacerbating any pain in this area.  She seems to have peri-incisional pain mostly involving the right lower quadrant incision. Skin: Her incisions are clean, dry and intact.  There are no masses, no significant ecchymosis or hematoma.  No erythema.  No drainage.  Assessment/Plan: This is a 31 y.o. female for days s/p robotic cholecystectomy.  Reviewed her pictures, and reiterated that she had 1 large stone.  Patient Active Problem List   Diagnosis Date Noted   CCC (chronic calculous cholecystitis) 02/27/2021   Polycystic ovary syndrome 12/04/2019   Thyrotoxicosis without thyroid storm 12/16/2018   S/P cesarean section 08/08/2018   Preterm premature rupture of membranes (PPROM) with unknown onset of labor 08/07/2018   Preterm labor 07/18/2018   Amenorrhea 08/25/2017   Health care maintenance 07/14/2016   Vaginal yeast infection 07/14/2016   Obesity (BMI  35.0-39.9 without comorbidity) 07/14/2016    -Reassurance is given, she will begin utilizing ibuprofen when her part of her pain control regimen along with Tylenol.  She will continue to avoid narcotics.  We have encouraged her to be more aggressive with controlling her constipation.  We will see her back in 9 days or as needed.   Ronny Bacon M.D., FACS 03/11/2021, 9:11 AM

## 2021-03-11 NOTE — Patient Instructions (Addendum)
If you have any concerns or questions, please feel free to call our office. Please keep your appointment on 03/20/2021 with Dr. Christian Mate.  Advised to pursue a goal of 25 to 30 g of fiber daily.  Made aware that the majority of this may be through natural sources, but advised to be aware of actual consumption and to ensure minimal consumption by daily supplementation.  Various forms of supplements discussed.  Strongly advised to consume more fluids to ensure adequate hydration, instructed to watch color of urine to determine adequacy of hydration.  Clarity is pursued in urine output, and bowel activity that correlates to significant meal intake.  Patient is to avoid deferring having bowel movements, advised to take the time at the first sign of sensation, typically following meals and in the morning.  Subsequent utilization of MiraLAX to ensure at least daily movement, ideally twice daily bowel movements.  If multiple doses of MiraLAX are necessary utilize them.   Minimally Invasive Cholecystectomy, Care After  What can I expect after the procedure? After the procedure, it is common to: Have pain at the areas of surgery. You will be given medicines for pain. Vomit or feel like you may vomit. Feel fullness in the belly (bloating) or have pain in the shoulder. This comes from the gas that was used during the surgery. Follow these instructions at home: Medicines Take over-the-counter and prescription medicines only as told by your doctor. If you were prescribed an antibiotic medicine, take it as told by your doctor. Do not stop taking it even if you start to feel better. If told, take steps to prevent problems with pooping (constipation). You may need to: Drink enough fluid to keep your pee (urine) pale yellow. Take medicines. You will be told what medicines to take. Eat foods that are high in fiber. These include beans, whole grains, and fresh fruits and vegetables. Limit foods that are high in fat  and sugar. These include fried or sweet foods. Ask your doctor if you should avoid driving or using machines while you are taking your medicine. Incision care  Follow instructions from your doctor about how to take care of your cuts from surgery (incisions). Make sure you: Wash your hands with soap and water for at least 20 seconds before and after you change your bandage (dressing). If you cannot use soap and water, use hand sanitizer. Change your bandage. Leave stitches (sutures) or skin glue in place for at least 2 weeks. Leave tape strips alone unless you are told to take them off. You may trim the edges of the tape strips if they curl up. Do not take baths, swim, or use a hot tub. Ask your doctor about taking showers or sponge baths. Check your incision area every day for signs of infection. Check for: More redness, swelling, or pain. Fluid or blood. Warmth. Pus or a bad smell. Activity Rest as told by your doctor. Do not do activities that require a lot of effort. Get up to take short walks every 1 to 2 hours. Ask for help if you feel weak or unsteady. Do not lift anything that is heavier than 10 lb (4.5 kg), or the limit that you are told. Do not play contact sports until your doctor says it is okay. Do not return to work or school until your doctor says it is okay. Return to your normal activities when your doctor says that it is safe. General instructions If you were given a sedative during your procedure,  do not drive or use machines until your doctor says that it is safe. A sedative is a medicine that helps you relax. Keep all follow-up visits. Contact a doctor if: You get a rash. You have more redness, swelling, or pain around your incisions. You have fluid or blood coming from your incisions. Your incisions feel warm to the touch. You have pus or a bad smell coming from your incisions. You have a fever. One or more of your incisions breaks open. Get help right away  if: You have trouble breathing. You have chest pain. You have pain that is getting worse in your shoulders. You faint or feel dizzy when you stand. You have very bad pain in your belly (abdomen). You feel like you may vomit or you vomit, and this lasts for more than one day. You have leg pain. These symptoms may be an emergency. Get help right away. Call 911. Do not wait to see if the symptoms will go away. Do not drive yourself to the hospital. Summary After your surgery, it is common to have pain at the areas of surgery. You may also vomit or feel fullness in the belly. Follow your doctor's instructions about medicine, activity restrictions, and caring for your surgery areas. Do not do activities that require a lot of effort. Contact a doctor if you have a fever or other signs of infection, such as more redness, swelling, or pain around your incisions. Get help right away if you have chest pain, increasing pain in the shoulders, or trouble breathing. This information is not intended to replace advice given to you by your health care provider. Make sure you discuss any questions you have with your health care provider. Document Revised: 07/16/2020 Document Reviewed: 07/16/2020 Elsevier Patient Education  [place] PATIENT INSTRUCTIONS   WOUND CARE INSTRUCTIONS:  Keep a dry clean dressing on the wound if there is drainage. The initial bandage may be removed after 24 hours.  Once the wound has quit draining you may leave it open to air.  If clothing rubs against the wound or causes irritation and the wound is not draining you may cover it with a dry dressing during the daytime.  Try to keep the wound dry and avoid ointments on the wound unless directed to do so.  If the wound becomes bright red and painful or starts to drain infected material that is not clear, please contact your physician immediately.  If the wound is mildly pink and has a thick firm ridge  underneath it, this is normal, and is referred to as a healing ridge.  This will resolve over the next 4-6 weeks.  BATHING: You may shower if you have been informed of this by your surgeon. However, Please do not submerge in a tub, hot tub, or pool until incisions are completely sealed or have been told by your surgeon that you may do so.  DIET:  You may eat any foods that you can tolerate.  It is a good idea to eat a high fiber diet and take in plenty of fluids to prevent constipation.  If you do become constipated you may want to take a mild laxative or take ducolax tablets on a daily basis until your bowel habits are regular.  Constipation can be very uncomfortable, along with straining, after recent surgery.  ACTIVITY:  You are encouraged to cough and deep breath or use your incentive spirometer if you were given one, every 15-30 minutes  when awake.  This will help prevent respiratory complications and low grade fevers post-operatively if you had a general anesthetic.  You may want to hug a pillow when coughing and sneezing to add additional support to the surgical area, if you had abdominal or chest surgery, which will decrease pain during these times.  You are encouraged to walk and engage in light activity for the next two weeks.  You should not lift, pull or push more than 15-20 pounds, until 04/04/2021 as it could put you at increased risk for complications.  Twenty pounds is roughly equivalent to a plastic bag of groceries. At that time- Listen to your body when lifting, if you have pain when lifting, stop and then try again in a few days. Soreness after doing exercises or activities of daily living is normal as you get back in to your normal routine.  MEDICATIONS:  Try to take narcotic medications and anti-inflammatory medications, such as tylenol, ibuprofen, naprosyn, etc., with food.  This will minimize stomach upset from the medication.  Should you develop nausea and vomiting from the pain  medication, or develop a rash, please discontinue the medication and contact your physician.  You should not drive, make important decisions, or operate machinery when taking narcotic pain medication.  SUNBLOCK Use sun block to incision area over the next year if this area will be exposed to sun. This helps decrease scarring and will allow you avoid a permanent darkened area over your incision.  QUESTIONS:  Please feel free to call our office if you have any questions, and we will be glad to assist you.

## 2021-03-20 ENCOUNTER — Other Ambulatory Visit: Payer: Self-pay

## 2021-03-20 ENCOUNTER — Ambulatory Visit (INDEPENDENT_AMBULATORY_CARE_PROVIDER_SITE_OTHER): Payer: BC Managed Care – PPO | Admitting: Surgery

## 2021-03-20 ENCOUNTER — Encounter: Payer: Self-pay | Admitting: Surgery

## 2021-03-20 VITALS — BP 114/72 | HR 89 | Temp 98.0°F | Ht 67.0 in | Wt 252.2 lb

## 2021-03-20 DIAGNOSIS — K8010 Calculus of gallbladder with chronic cholecystitis without obstruction: Secondary | ICD-10-CM

## 2021-03-20 DIAGNOSIS — Z9049 Acquired absence of other specified parts of digestive tract: Secondary | ICD-10-CM

## 2021-03-20 DIAGNOSIS — Z09 Encounter for follow-up examination after completed treatment for conditions other than malignant neoplasm: Secondary | ICD-10-CM

## 2021-03-20 NOTE — Patient Instructions (Addendum)
If you have any concerns or questions, please feel free to call our office. Follow up as needed.    GENERAL POST-OPERATIVE PATIENT INSTRUCTIONS   WOUND CARE INSTRUCTIONS:  Keep a dry clean dressing on the wound if there is drainage. The initial bandage may be removed after 24 hours.  Once the wound has quit draining you may leave it open to air.  If clothing rubs against the wound or causes irritation and the wound is not draining you may cover it with a dry dressing during the daytime.  Try to keep the wound dry and avoid ointments on the wound unless directed to do so.  If the wound becomes bright red and painful or starts to drain infected material that is not clear, please contact your physician immediately.  If the wound is mildly pink and has a thick firm ridge underneath it, this is normal, and is referred to as a healing ridge.  This will resolve over the next 4-6 weeks.  BATHING: You may shower if you have been informed of this by your surgeon. However, Please do not submerge in a tub, hot tub, or pool until incisions are completely sealed or have been told by your surgeon that you may do so.  DIET:  You may eat any foods that you can tolerate.  It is a good idea to eat a high fiber diet and take in plenty of fluids to prevent constipation.  If you do become constipated you may want to take a mild laxative or take ducolax tablets on a daily basis until your bowel habits are regular.  Constipation can be very uncomfortable, along with straining, after recent surgery.  ACTIVITY:  You are encouraged to cough and deep breath or use your incentive spirometer if you were given one, every 15-30 minutes when awake.  This will help prevent respiratory complications and low grade fevers post-operatively if you had a general anesthetic.  You may want to hug a pillow when coughing and sneezing to add additional support to the surgical area, if you had abdominal or chest surgery, which will decrease pain  during these times.  You are encouraged to walk and engage in light activity for the next two weeks.  You should not lift, push or pull more than 15-20 pounds, until 04/04/2021 as it could put you at increased risk for complications.  Twenty pounds is roughly equivalent to a plastic bag of groceries. At that time- Listen to your body when lifting, if you have pain when lifting, stop and then try again in a few days. Soreness after doing exercises or activities of daily living is normal as you get back in to your normal routine.  MEDICATIONS:  Try to take narcotic medications and anti-inflammatory medications, such as tylenol, ibuprofen, naprosyn, etc., with food.  This will minimize stomach upset from the medication.  Should you develop nausea and vomiting from the pain medication, or develop a rash, please discontinue the medication and contact your physician.  You should not drive, make important decisions, or operate machinery when taking narcotic pain medication.  SUNBLOCK Use sun block to incision area over the next year if this area will be exposed to sun. This helps decrease scarring and will allow you avoid a permanent darkened area over your incision.  QUESTIONS:  Please feel free to call our office if you have any questions, and we will be glad to assist you.

## 2021-03-20 NOTE — Progress Notes (Signed)
[facility] POST-OP OFFICE VISIT  03/21/2021  HPI: Caitlyn Ramos is a 31 y.o. female 13 days s/p robotic cholecystectomy.  Doing well, reporting normal bowel movements.  She denies nausea, vomiting, fevers and chills.  She is quite pleased now that she is on a bowel regimen is helping her movements.  Vital signs: BP 114/72    Pulse 89    Temp 98 F (36.7 C) (Oral)    Ht 5\' 7"  (1.702 m)    Wt 252 lb 3.2 oz (114.4 kg)    SpO2 98%    BMI 39.50 kg/m    Physical Exam: Constitutional: She appears well.  Much brighter and happier than our last visit Abdomen: Soft and nontender. Skin: Some bruising and more than the expected hematoma at the various needle site in the left costal margin.  Otherwise all incisions are clean, dry and intact.  Assessment/Plan: This is a 31 y.o. female 13 days s/p robotic cholecystectomy.  Perioperative constipation is now resolved with addition of fiber, fluids and MiraLAX.  Patient Active Problem List   Diagnosis Date Noted   S/P laparoscopic cholecystectomy 03/11/2021   CCC (chronic calculous cholecystitis) 02/27/2021   Polycystic ovary syndrome 12/04/2019   Thyrotoxicosis without thyroid storm 12/16/2018   S/P cesarean section 08/08/2018   Preterm premature rupture of membranes (PPROM) with unknown onset of labor 08/07/2018   Preterm labor 07/18/2018   Amenorrhea 08/25/2017   Health care maintenance 07/14/2016   Vaginal yeast infection 07/14/2016   Obesity (BMI 35.0-39.9 without comorbidity) 07/14/2016    -I am expectant that all her incisions will progress well, and we will be glad to see her again as needed.  Questions answered.  She knows to limit her activity/weight lifting for the next month, and then she is free to resume full activity.   Ronny Bacon M.D., FACS 03/21/2021, 9:17 AM

## 2021-04-14 ENCOUNTER — Encounter: Payer: Self-pay | Admitting: Physician Assistant

## 2021-06-15 IMAGING — US US MFM OB FOLLOW UP ADDL GEST
1 series · 13 of 28 positions shown · non-contrast
Comparison: none

[Series 1: us mfm ob follow up addl gest · 70 acquisitions, 13 frames shown]
[im 3/70]
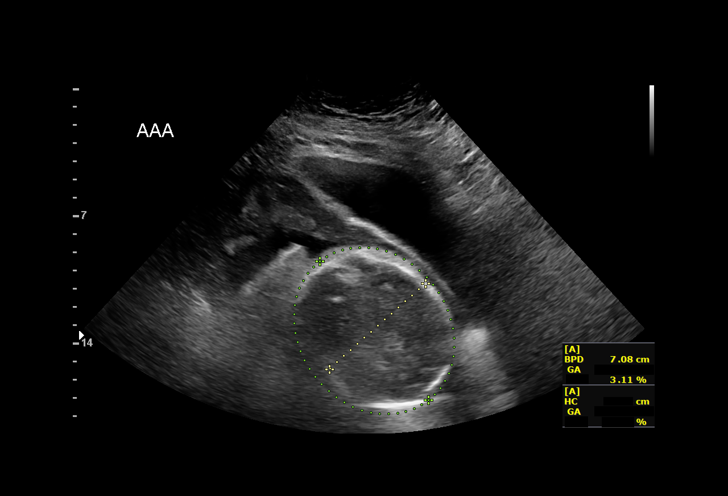
[im 8/70]
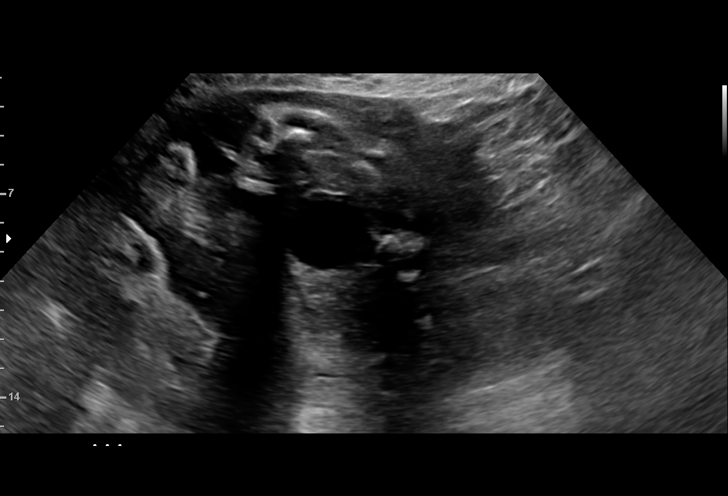
[im 13/70]
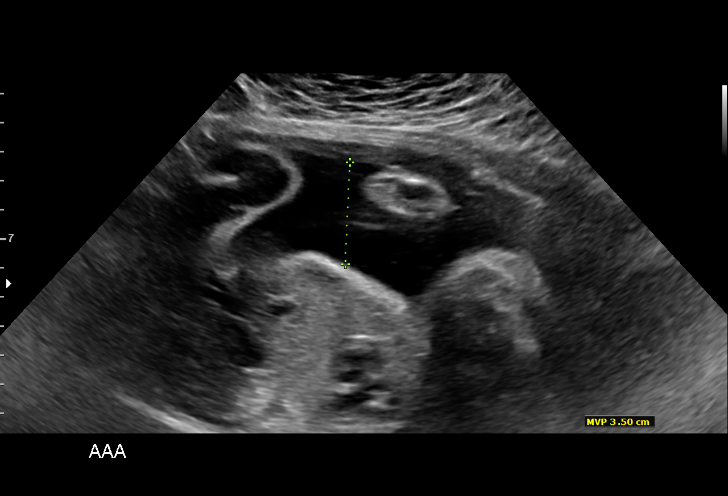
[im 18/70]
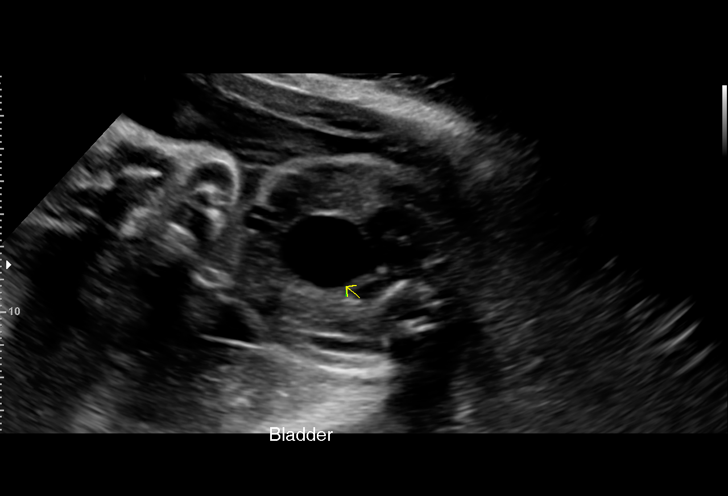
[im 24/70]
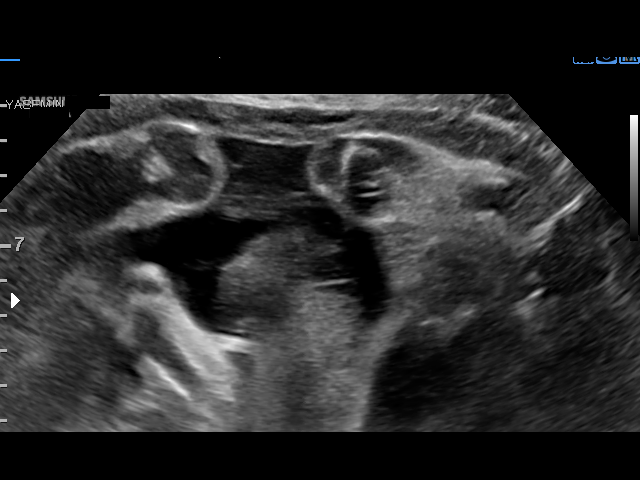
[im 29/70]
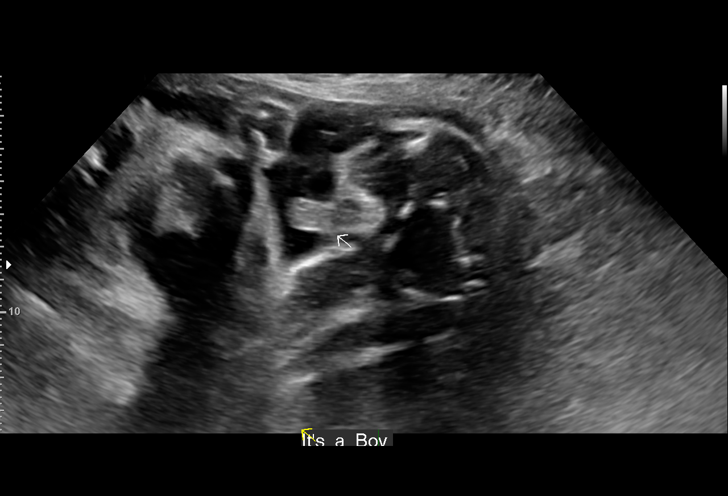
[im 36/70]
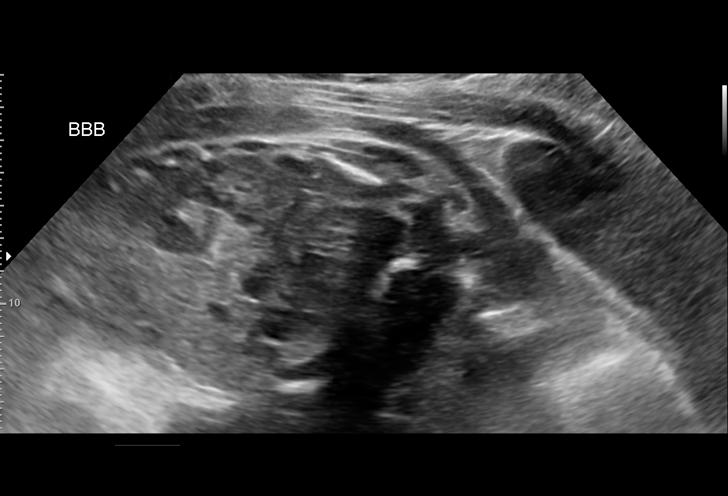
[im 41/70]
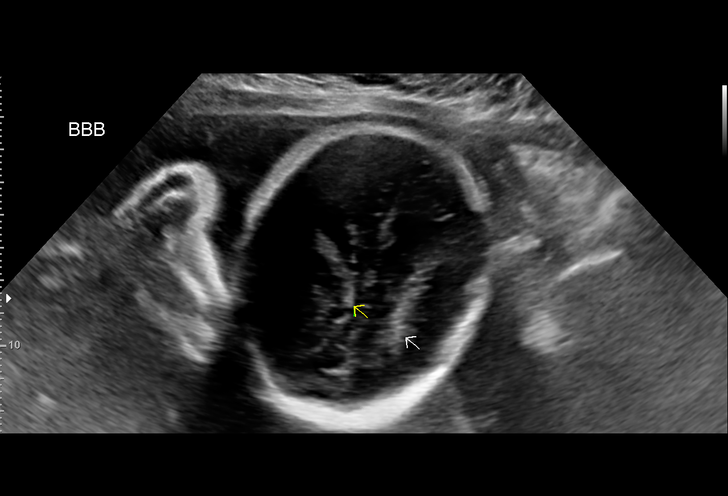
[im 47/70]
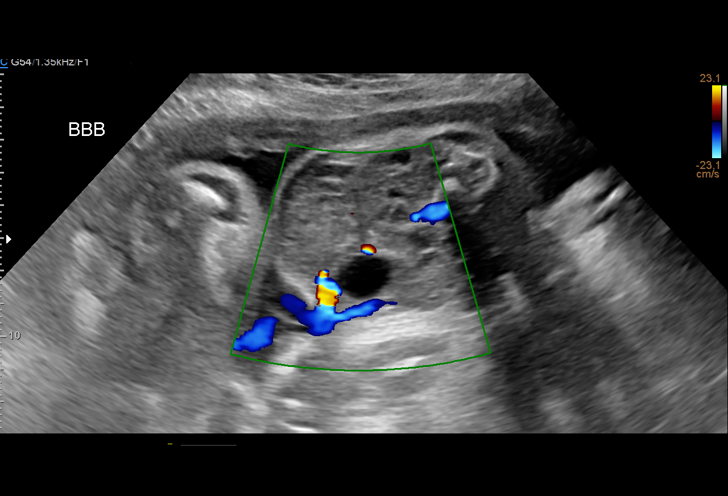
[im 52/70]
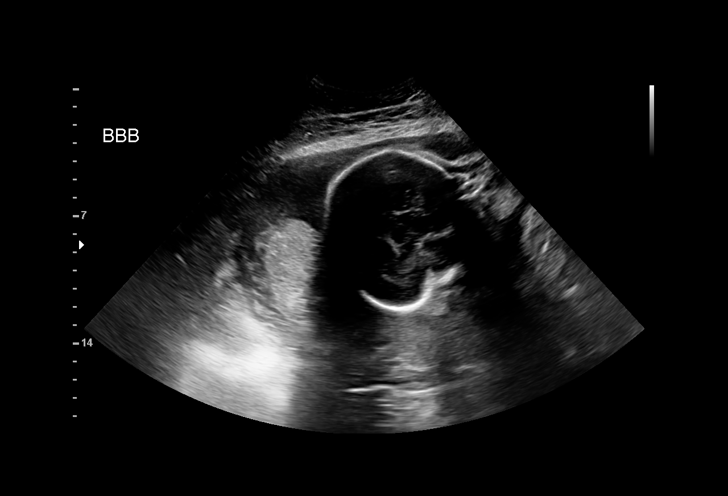
[im 57/70]
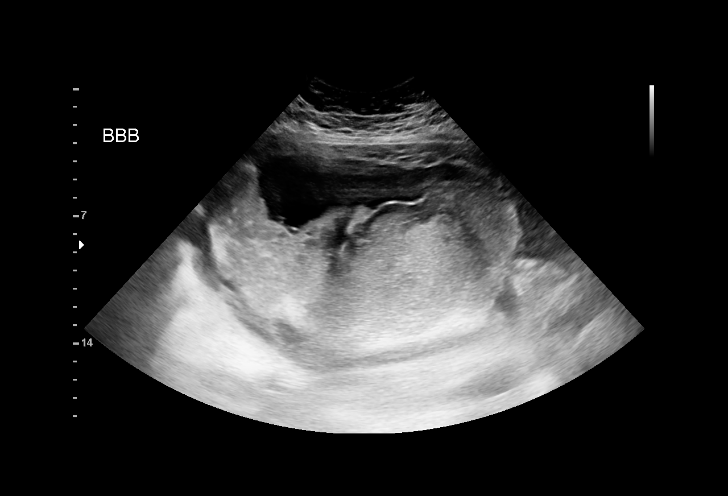
[im 62/70]
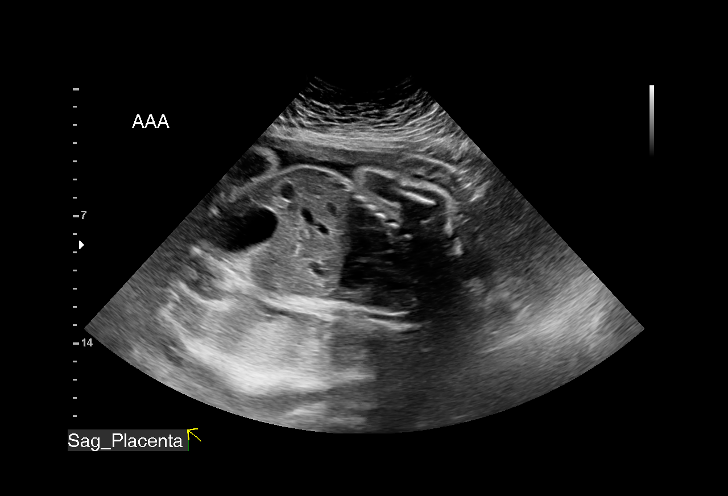
[im 67/70]
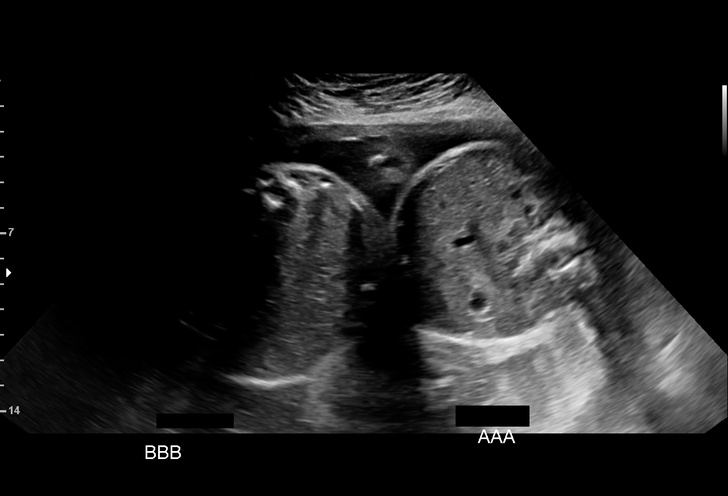

[13 of 28 positions shown; findings below may reference images not displayed]

Performed By:     Arissa Billiot        Secondary Phy.:   [facility]
                                                            #[id number]
                   MAU/Triage                               [HOSPITAL]

     GEST
 ----------------------------------------------------------------------

 ----------------------------------------------------------------------
Indications

  Premature rupture of membranes - leaking
  fluid
  Twin pregnancy, di/di, third trimester
  Maternal morbid obesity (BMI:63)
  Preterm labor without delivery, third [id number]
  Cervical shortening, third trimester
  30 weeks gestation of pregnancy
 ----------------------------------------------------------------------
Vital Signs

 BMI:
Fetal Evaluation (Fetus A)

 Num Of Fetuses:         2
 Fetal Heart Rate(bpm):  124
 Cardiac Activity:       Observed
 Fetal Lie:              Maternal left side, lower fetus
 Presentation:           Cephalic
 Placenta:               Posterior
 P. Cord Insertion:      Not well visualized

 Amniotic Fluid
 AFI FV:      Subjectively within normal limits
                             Largest Pocket(cm)


 Comment:    Breathing noted.
Biometry (Fetus A)

 BPD:      71.7  mm     G. Age:  28w 5d          6  %    CI:        68.09   %    70 - 86
                                                         FL/HC:      20.5   %    19.2 -
 HC:      277.9  mm     G. Age:  30w 3d         18  %    HC/AC:      1.08        0.99 -
 AC:       258   mm     G. Age:  30w 0d         35  %    FL/BPD:     79.6   %    71 - 87
 FL:       57.1  mm     G. Age:  30w 0d         26  %    FL/AC:      22.1   %    20 - 24

 Est. FW:    1821  gm      3 lb 4 oz     25  %     FW Discordancy        14  %
OB History

 Gravidity:    2         Term:   0        Prem:   0        SAB:   1
 TOP:          0       Ectopic:  0        Living: 0
Gestational Age (Fetus A)

 LMP:           34w 5d        Date:  12/07/17                 EDD:   09/13/18
 U/S Today:     29w 6d                                        EDD:   10/17/18
 Best:          30w 2d     Det. By:  Early Ultrasound         EDD:   10/14/18
                                     (03/14/18)
Anatomy (Fetus A)

 Diaphragm:             Appears normal         Kidneys:                Appear normal
 Stomach:               Appears normal, left   Bladder:                Appears normal
                        sided
 Abdominal Wall:        Appears nml (cord
                        insert, abd wall)

 Other:  Male gender.Technicallly difficult due to advanced GA and maternal
         habitus (BMI:63).

Fetal Evaluation (Fetus B)

 Num Of Fetuses:         2
 Fetal Heart Rate(bpm):  132
 Cardiac Activity:       Observed
 Fetal Lie:              Maternal right side
 Presentation:           Breech
 Placenta:               Posterior
 P. Cord Insertion:      Not well visualized

 Amniotic Fluid
 AFI FV:      Subjectively within normal limits

                             Largest Pocket(cm)


 Comment:    Breathing and baby movements noted
Biometry (Fetus B)

 BPD:      77.4  mm     G. Age:  31w 0d         63  %    CI:         76.1   %    70 - 86
                                                         FL/HC:      19.6   %    19.2 -
 HC:      281.2  mm     G. Age:  30w 6d         28  %    HC/AC:      0.98        0.99 -
 AC:      285.5  mm     G. Age:  32w 4d         95  %    FL/BPD:     71.3   %    71 - 87
 FL:       55.2  mm     G. Age:  29w 1d         10  %    FL/AC:      19.3   %    20 - 24

 Est. FW:    2122  gm    3 lb 13 oz      71  %     FW Discordancy     0 \ 14 %
Gestational Age (Fetus B)

 LMP:           34w 5d        Date:  12/07/17                 EDD:   09/13/18
 U/S Today:     30w 6d                                        EDD:   10/10/18
 Best:          30w 2d     Det. By:  Early Ultrasound         EDD:   10/14/18
                                     (03/14/18)
Anatomy (Fetus B)

 Choroid Plexus:        Appears normal         Kidneys:                Appear normal
 Stomach:               Appears normal, left   Bladder:                Appears normal
                        sided
 Abdominal Wall:        Appears nml (cord
                        insert, abd wall)

 Other:  Technicallly difficult due to advanced GA and maternal habitus BMI:63
Cervix Uterus Adnexa

 Cervix
 Not adaquately visualized
Impression

 Dichorionic-diamniotic twin pregnancy. Patient is admitted
 with diagnosis of preterm premature rupture of membranes.
 Twin A: Maternal left, cephalic, posterior placenta. Amniotic
 fluid is normal and good fetal activity is seen. Fetal growth is
 appropriate for gestational age.

 Twin B: Maternal right, breech, posterior placenta. Amniotic
 fluid is normal and good fetal activity is seen. Fetal growth is
 appropriate for gestational age.

 Growth discordancy: 14% (normal).

 Detailed anatomical survey was not performed
Recommendations

 -BPP next week and then weekly till delivery.
                 Sift, Ifj Monori

## 2021-09-16 DIAGNOSIS — N760 Acute vaginitis: Secondary | ICD-10-CM | POA: Diagnosis not present

## 2021-09-16 DIAGNOSIS — Z30432 Encounter for removal of intrauterine contraceptive device: Secondary | ICD-10-CM | POA: Diagnosis not present

## 2021-10-20 IMAGING — CT CT HEAD W/O CM
4 series · 16 of 47 positions shown, 18 images · non-contrast
Comparison: None.

CLINICAL DATA: Diplopia

EXAM:
CT HEAD WITHOUT CONTRAST
TECHNIQUE: Contiguous axial images were obtained from the base of the skull
through the vertex without intravenous contrast.

[Series 3: head wo · axial · 0.44mm/px · z∈[-86,+30]mm · 7 of 31 slices shown, 9 images]
[im 4/31  brain]
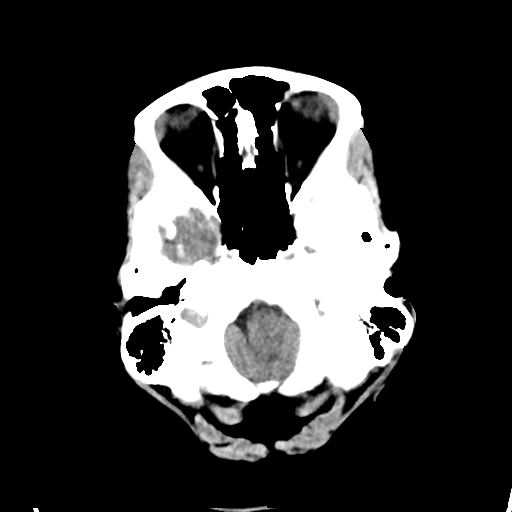
[im 4/31  bone]
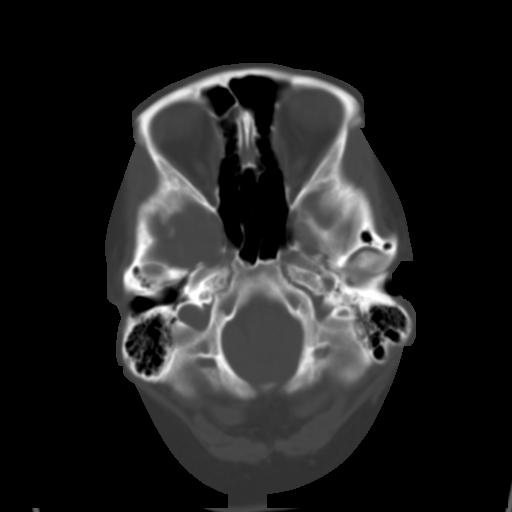
[im 8/31  brain]
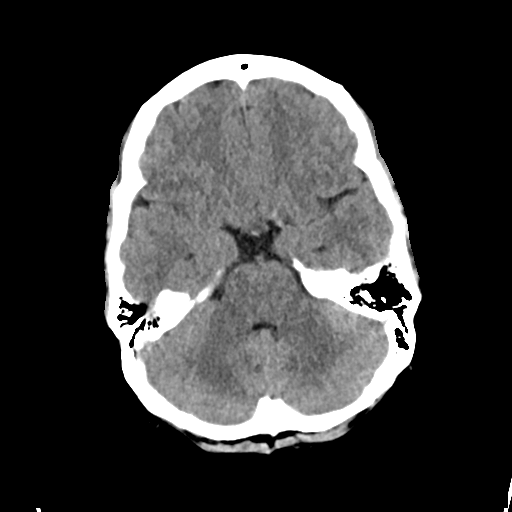
[im 12/31  brain]
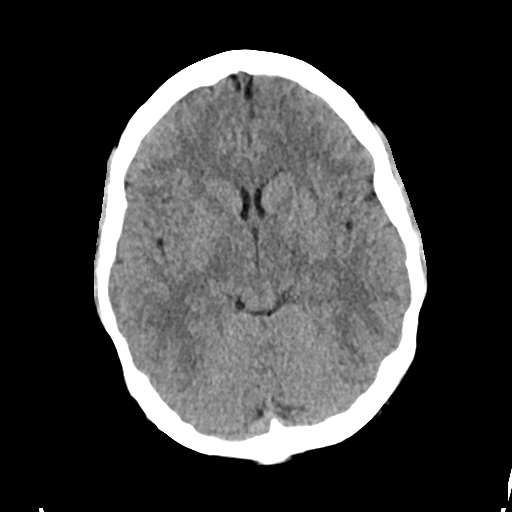
[im 16/31  brain]
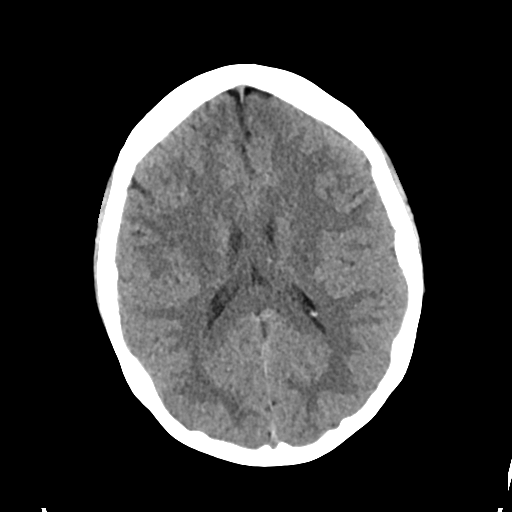
[im 19/31  brain]
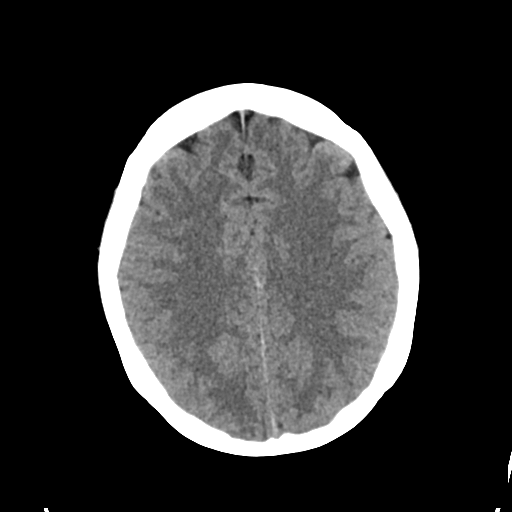
[im 19/31  bone]
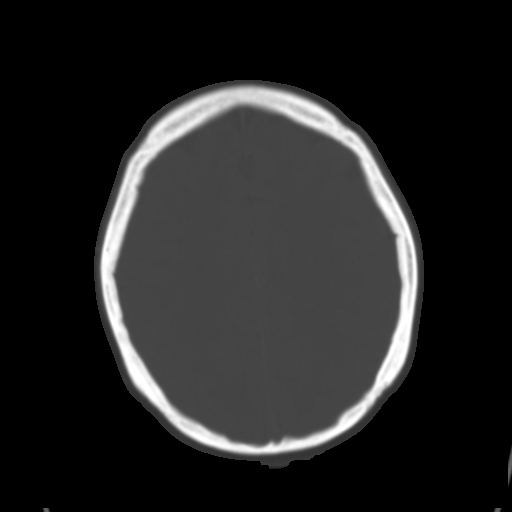
[im 23/31  brain]
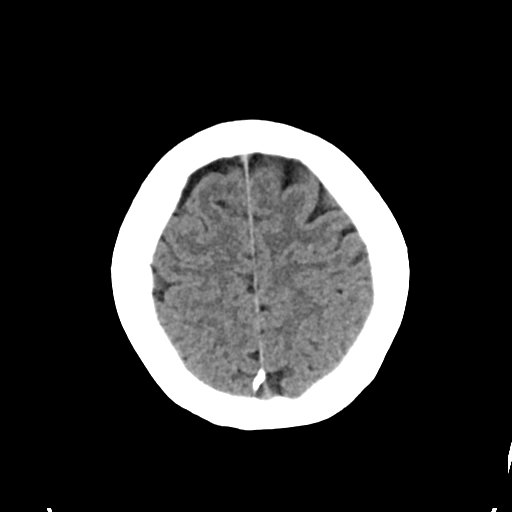
[im 27/31  brain]
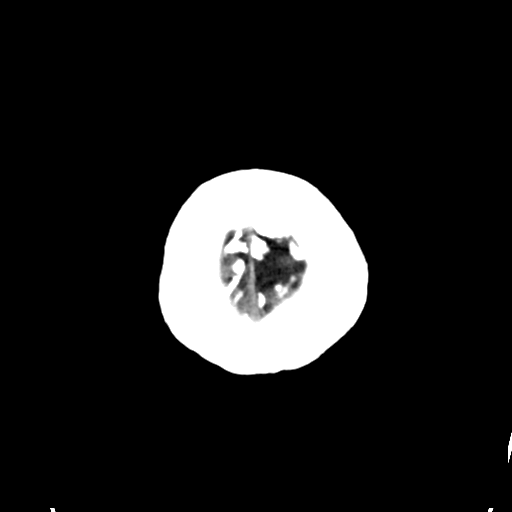

[Series 4: head bone · axial · 0.44mm/px · z∈[-86,-56]mm · 3 of 76 slices shown]
[im 8/76  bone]
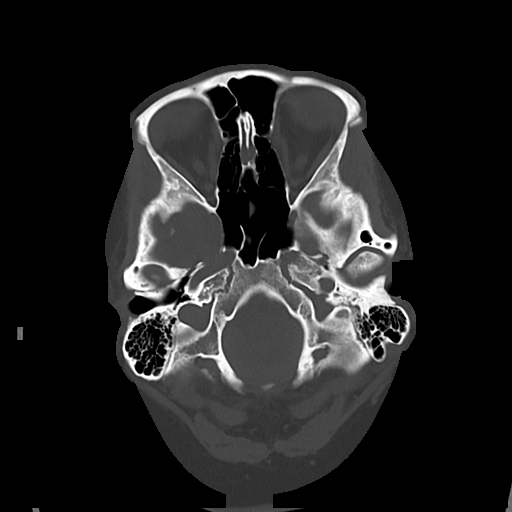
[im 16/76  bone]
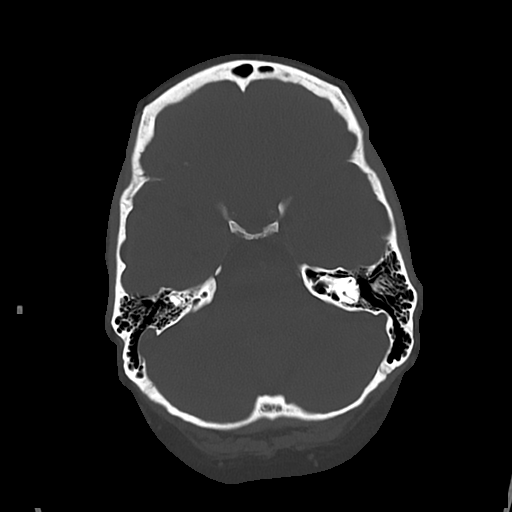
[im 23/76  bone]
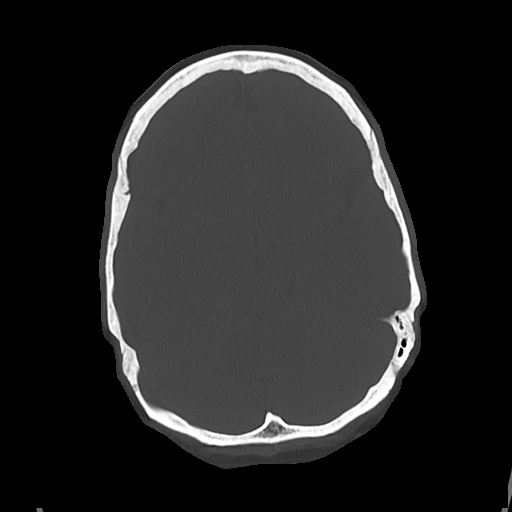

[Series 5: cor soft · coronal · 0.31mm/px · 3 of 68 slices shown]
[im 23/68  brain]
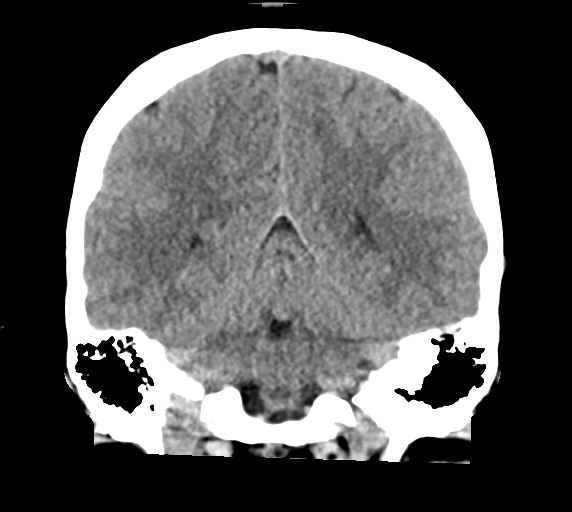
[im 30/68  brain]
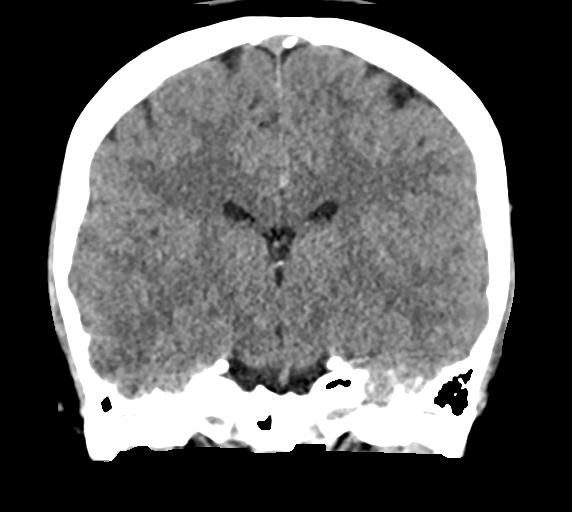
[im 38/68  brain]
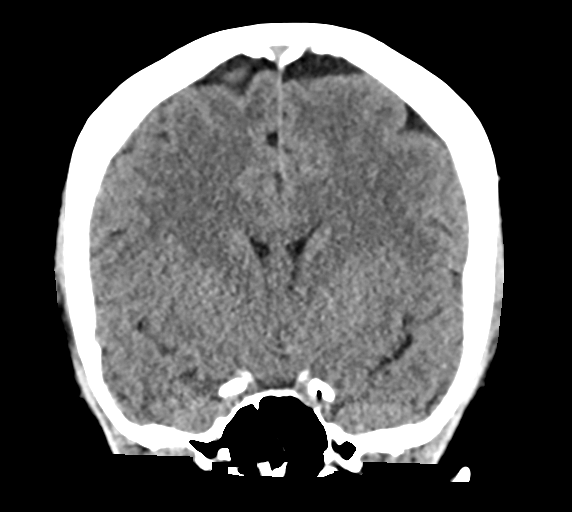

[Series 6: sag soft · sagittal · 0.31mm/px · 3 of 59 slices shown]
[im 20/59  brain]
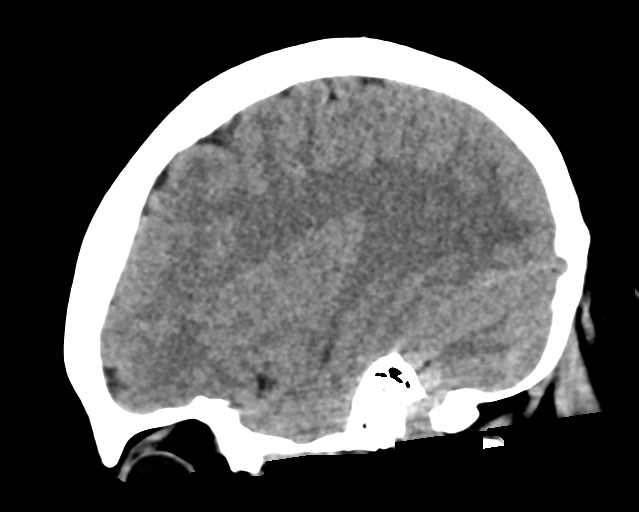
[im 30/59  brain]
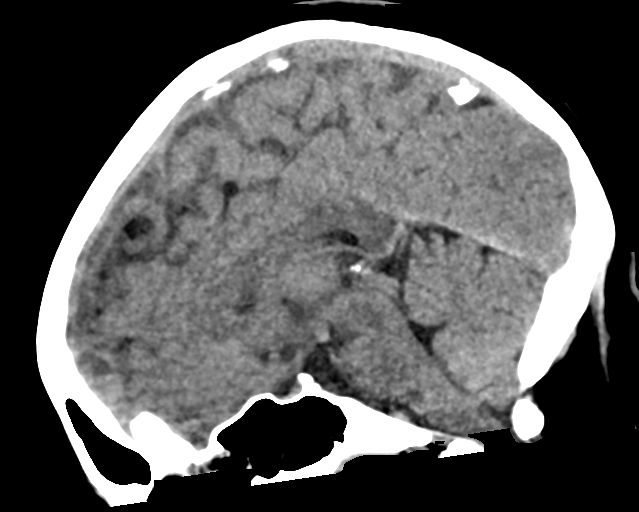
[im 39/59  brain]
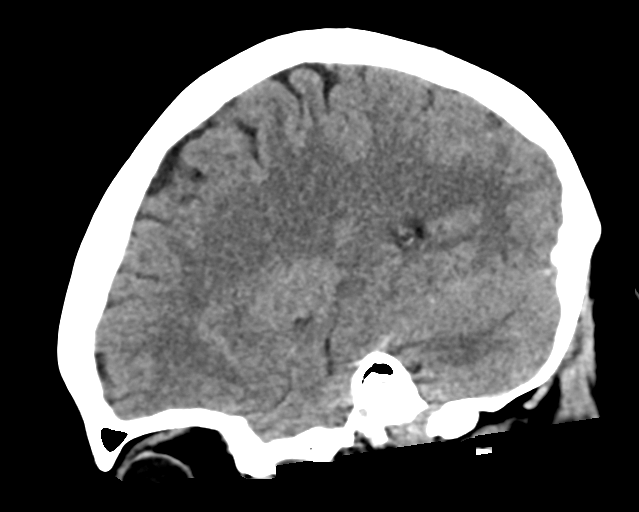

[16 of 47 positions shown; findings below may reference images not displayed]

FINDINGS: Brain: Ventricles are normal in size and configuration. There is no
intracranial mass, hemorrhage, extra-axial fluid collection, or
midline shift. The brain parenchyma appears unremarkable. There is
no evident acute infarct.

Vascular: There is no hyperdense vessel. No vascular calcifications
are evident.

Skull: The bony calvarium appears intact.

Sinuses/Orbits: There is mucosal thickening in several ethmoid air
cells. There is mucosal thickening in the medial left sphenoid sinus
region. Visualized orbits appear symmetric bilaterally.

Other: Mastoid air cells are clear.
IMPRESSION: Foci of paranasal sinus disease.  Study otherwise unremarkable.

## 2021-12-15 DIAGNOSIS — Z6837 Body mass index (BMI) 37.0-37.9, adult: Secondary | ICD-10-CM | POA: Diagnosis not present

## 2021-12-15 DIAGNOSIS — Z01419 Encounter for gynecological examination (general) (routine) without abnormal findings: Secondary | ICD-10-CM | POA: Diagnosis not present

## 2022-01-05 ENCOUNTER — Encounter: Payer: BC Managed Care – PPO | Admitting: Physician Assistant

## 2022-01-14 ENCOUNTER — Telehealth: Payer: Self-pay | Admitting: *Deleted

## 2022-01-14 NOTE — Telephone Encounter (Signed)
Tried to contact pt to inform her of her upcoming appointment and while typing this she called back and I gave her the time and date. Caitlyn Ramos, CMA

## 2022-01-28 DIAGNOSIS — M5020 Other cervical disc displacement, unspecified cervical region: Secondary | ICD-10-CM | POA: Diagnosis not present

## 2022-01-28 DIAGNOSIS — M9902 Segmental and somatic dysfunction of thoracic region: Secondary | ICD-10-CM | POA: Diagnosis not present

## 2022-01-28 DIAGNOSIS — M9901 Segmental and somatic dysfunction of cervical region: Secondary | ICD-10-CM | POA: Diagnosis not present

## 2022-01-28 DIAGNOSIS — M7912 Myalgia of auxiliary muscles, head and neck: Secondary | ICD-10-CM | POA: Diagnosis not present

## 2022-02-02 DIAGNOSIS — M7912 Myalgia of auxiliary muscles, head and neck: Secondary | ICD-10-CM | POA: Diagnosis not present

## 2022-02-02 DIAGNOSIS — M5020 Other cervical disc displacement, unspecified cervical region: Secondary | ICD-10-CM | POA: Diagnosis not present

## 2022-02-02 DIAGNOSIS — M9902 Segmental and somatic dysfunction of thoracic region: Secondary | ICD-10-CM | POA: Diagnosis not present

## 2022-02-02 DIAGNOSIS — M9901 Segmental and somatic dysfunction of cervical region: Secondary | ICD-10-CM | POA: Diagnosis not present

## 2022-02-05 ENCOUNTER — Encounter: Payer: BC Managed Care – PPO | Admitting: Nurse Practitioner

## 2022-02-09 NOTE — Progress Notes (Signed)
Complete physical exam   Patient: Caitlyn Ramos   DOB: 12-05-1990   32 y.o. Female  MRN: [id number] Visit Date: 02/10/2022    No chief complaint on file.  Subjective    KIREN Ramos is a 32 y.o. female who presents today for a complete physical exam.  She reports consuming a  diet.  She generally feels . She  have additional problems to discuss today.   HPI  Annual physical  -due for routine, fasting labs  -?flu vaccine -history of polycystic ovary syndrome.  -discussed with previous provider about having reproductive hormones checked.   Past Medical History:  Diagnosis Date   Chronic cholecystitis with calculus 02/2021   Fibroid    Generalized headaches    Hyperthyroidism 2020   after pregnancy; took medication for 6 months   PCOS (polycystic ovarian syndrome)    Past Surgical History:  Procedure Laterality Date   CESAREAN SECTION N/A 08/07/2018   Procedure: CESAREAN SECTION;  Surgeon: Linda Hedges, DO;  Location: [facility] LD ORS;  Service: Obstetrics;  Laterality: N/A;   WISDOM TOOTH EXTRACTION  2009 approximate   Social History   Socioeconomic History   Marital status: Married    Spouse name: [name]   Number of children: 2   Years of education: Not on file   Highest education level: Not on file  Occupational History   Not on file  Tobacco Use   Smoking status: Never   Smokeless tobacco: Never  Vaping Use   Vaping Use: Never used  Substance and Sexual Activity   Alcohol use: No   Drug use: Not Currently    Types: Marijuana   Sexual activity: Yes    Partners: Male    Birth control/protection: I.U.D.  Other Topics Concern   Not on file  Social History Narrative   Not on file   Social Determinants of Health   Financial Resource Strain: Low Risk  (08/07/2018)   Overall Financial Resource Strain (CARDIA)    Difficulty of Paying Living Expenses: Not hard at all  Food Insecurity: No Food Insecurity (08/07/2018)   Hunger Vital Sign    Worried About Running Out  of Food in the Last Year: Never true    Ran Out of Food in the Last Year: Never true  Transportation Needs: No Transportation Needs (08/07/2018)   PRAPARE - Hydrologist (Medical): No    Lack of Transportation (Non-Medical): No  Physical Activity: Unknown (08/07/2018)   Exercise Vital Sign    Days of Exercise per Week: Patient refused    Minutes of Exercise per Session: Patient refused  Stress: No Stress Concern Present (08/07/2018)   [place]    Feeling of Stress : Only a little  Social Connections: Unknown (08/07/2018)   Social Connection and Isolation Panel [NHANES]    Frequency of Communication with Friends and Family: Patient refused    Frequency of Social Gatherings with Friends and Family: Patient refused    Attends Religious Services: Patient refused    Active Member of Clubs or Organizations: Patient refused    Attends Archivist Meetings: Patient refused    Marital Status: Patient refused  Intimate Partner Violence: Not At Risk (08/07/2018)   Humiliation, Afraid, Rape, and Kick questionnaire    Fear of Current or Ex-Partner: No    Emotionally Abused: No    Physically Abused: No    Sexually Abused: No   Family Status  Relation Name Status   Mother  Alive   Father  Alive   MGF  Deceased   Brother  Alive   Mat Aunt  Alive   Mat Uncle  Alive   Pat Aunt  Alive   Pat Uncle  Alive   MGM  Alive   Alfred  Deceased   PGF  Alive   Family History  Problem Relation Age of Onset   Heart disease Father    Hypertension Father    Thyroid disease Father    Cancer Maternal Grandfather        lung   Early death Maternal Grandfather    Alcohol abuse Maternal Grandmother    COPD Maternal Grandmother    Cancer Paternal Grandmother    Early death Paternal Grandmother    Kidney disease Paternal Grandmother    Diabetes Paternal Grandfather    No Known Allergies  Patient Care  Team: Lorrene Reid, PA-C (Inactive) as PCP - General (Physician Assistant) Kate Sable, MD as PCP - Cardiology (Cardiology)   Medications: Outpatient Medications Prior to Visit  Medication Sig   acetaminophen (TYLENOL) 500 MG tablet Take 500-1,000 mg by mouth every 6 (six) hours as needed (for pain.).   butalbital-acetaminophen-caffeine (FIORICET) 50-325-40 MG tablet Take 1 tablet by mouth every 4 (four) hours as needed for migraine.   naproxen sodium (ALEVE) 220 MG tablet Take 220-440 mg by mouth.   Pseudoephedrine-Naproxen Na (ALEVE-D SINUS & COLD PO) Take 1 tablet by mouth as needed.   No facility-administered medications prior to visit.    Review of Systems     Objective    There were no vitals filed for this visit. There is no height or weight on file to calculate BMI.  BP Readings from Last 3 Encounters:  02/10/22 117/82  03/20/21 114/72  03/11/21 113/61    Wt Readings from Last 3 Encounters:  02/10/22 232 lb (105.2 kg)  03/20/21 252 lb 3.2 oz (114.4 kg)  03/11/21 254 lb 3.2 oz (115.3 kg)     Physical Exam    Last depression screening scores   Row Labels 02/10/2022   10:19 AM 01/02/2021    3:05 PM 12/04/2019    1:54 PM  PHQ 2/9 Scores   Section Header. No data exists in this row.     PHQ - 2 Score   0 0 0  PHQ- 9 Score   0 0 0   Last fall risk screening   Row Labels 02/10/2022   10:19 AM  Fall Risk    Section Header. No data exists in this row.   Falls in the past year?   0  Number falls in past yr:   0  Injury with Fall?   0   Last Audit-C alcohol use screening   No data to display    A score of 3 or more in women, and 4 or more in men indicates increased risk for alcohol abuse, EXCEPT if all of the points are from question 1   Results for orders placed or performed in visit on 02/10/22  Testosterone, free  Result Value Ref Range   Testosterone, Free 1.2 0.0 - 4.2 pg/mL  Testosterone, % free  Result Value Ref Range   Testosterone-%  Free 0.9 %  Sex hormone binding globulin  Result Value Ref Range   Sex Hormone Binding 77.4 24.6 - 122.0 nmol/L  Ferritin  Result Value Ref Range   Ferritin 51 15 - 150 ng/mL  Results for orders placed  or performed in visit on 02/10/22  TSH + free T4  Result Value Ref Range   TSH 2.330 0.450 - 4.500 uIU/mL   Free T4 1.38 0.82 - 1.77 ng/dL  VITAMIN D 25 Hydroxy (Vit-D Deficiency, Fractures)  Result Value Ref Range   Vit D, 25-Hydroxy 28.5 (L) 30.0 - 100.0 ng/mL  Hemoglobin A1c  Result Value Ref Range   Hgb A1c MFr Bld 4.9 4.8 - 5.6 %   Est. average glucose Bld gHb Est-mCnc 94 mg/dL  Lipid panel  Result Value Ref Range   Cholesterol, Total 175 100 - 199 mg/dL   Triglycerides 51 0 - 149 mg/dL   HDL 59 >39 mg/dL   VLDL Cholesterol Cal 10 5 - 40 mg/dL   LDL Chol Calc (NIH) 106 (H) 0 - 99 mg/dL   Chol/HDL Ratio 3.0 0.0 - 4.4 ratio  Comprehensive metabolic panel  Result Value Ref Range   Glucose 87 70 - 99 mg/dL   BUN 18 6 - 20 mg/dL   Creatinine, Ser 1.04 (H) 0.57 - 1.00 mg/dL   eGFR 74 >59 mL/min/1.73   BUN/Creatinine Ratio 17 9 - 23   Sodium 140 134 - 144 mmol/L   Potassium 4.6 3.5 - 5.2 mmol/L   Chloride 102 96 - 106 mmol/L   CO2 21 20 - 29 mmol/L   Calcium 9.7 8.7 - 10.2 mg/dL   Total Protein 7.2 6.0 - 8.5 g/dL   Albumin 4.7 4.0 - 5.0 g/dL   Globulin, Total 2.5 1.5 - 4.5 g/dL   Albumin/Globulin Ratio 1.9 1.2 - 2.2   Bilirubin Total 0.5 0.0 - 1.2 mg/dL   Alkaline Phosphatase 50 44 - 121 IU/L   AST 18 0 - 40 IU/L   ALT 20 0 - 32 IU/L  CBC  Result Value Ref Range   WBC 5.0 3.4 - 10.8 x10E3/uL   RBC 4.25 3.77 - 5.28 x10E6/uL   Hemoglobin 13.3 11.1 - 15.9 g/dL   Hematocrit 40.3 34.0 - 46.6 %   MCV 95 79 - 97 fL   MCH 31.3 26.6 - 33.0 pg   MCHC 33.0 31.5 - 35.7 g/dL   RDW 12.9 11.7 - 15.4 %   Platelets 277 150 - 450 x10E3/uL  FSH/LH  Result Value Ref Range   LH 7.4 mIU/mL   FSH 3.7 mIU/mL  Estradiol  Result Value Ref Range   Estradiol 146.0 pg/mL  Testosterone   Result Value Ref Range   Testosterone 45 13 - 71 ng/dL  Progesterone  Result Value Ref Range   Progesterone 14.8 ng/mL  Thyroid peroxidase antibody  Result Value Ref Range   Thyroperoxidase Ab SerPl-aCnc 63 (H) 0 - 34 IU/mL  Thyroglobulin Level  Result Value Ref Range   Thyroglobulin (TG-RIA) 15 ng/mL    Assessment & Plan    Routine Health Maintenance and Physical Exam  Exercise Activities and Dietary recommendations  Goals   None     Immunization History  Administered Date(s) Administered   Influenza,inj,Quad PF,6+ Mos 12/04/2019, 01/02/2021   Tdap 07/15/2018    Health Maintenance  Topic Date Due   COVID-19 Vaccine (1) Never done   Hepatitis C Screening  Never done   INFLUENZA VACCINE  08/26/2021   PAP SMEAR-Modifier  10/11/2022   DTaP/Tdap/Td (2 - Td or Tdap) 07/14/2028   HIV Screening  Completed   HPV VACCINES  Aged Out    Discussed health benefits of physical activity, and encouraged her to engage in regular exercise appropriate for her  age and condition.  Problem List Items Addressed This Visit       Endocrine   Thyrotoxicosis without thyroid storm   Relevant Orders   TSH + free T4 (Completed)   FSH/LH (Completed)   Estradiol (Completed)   Testosterone (Completed)   Progesterone (Completed)   Thyroid peroxidase antibody (Completed)   Thyroglobulin Level (Completed)   Polycystic ovary syndrome   Relevant Orders   FSH/LH (Completed)   Estradiol (Completed)   Testosterone (Completed)   Progesterone (Completed)   Other Visit Diagnoses     Healthcare maintenance    -  Primary   Relevant Orders   Hemoglobin A1c (Completed)   Lipid panel (Completed)   Comprehensive metabolic panel (Completed)   CBC (Completed)   Hyperthyroidism       Relevant Orders   TSH + free T4 (Completed)   Vitamin D deficiency       Relevant Orders   VITAMIN D 25 Hydroxy (Vit-D Deficiency, Fractures) (Completed)        No follow-ups on file.        Ronnell Freshwater, NP  [facility] [phone] (phone) [phone] (fax)  [place]

## 2022-02-10 ENCOUNTER — Ambulatory Visit (INDEPENDENT_AMBULATORY_CARE_PROVIDER_SITE_OTHER): Payer: BC Managed Care – PPO | Admitting: Nurse Practitioner

## 2022-02-10 ENCOUNTER — Other Ambulatory Visit: Payer: BC Managed Care – PPO

## 2022-02-10 VITALS — BP 117/82 | HR 52 | Resp 18 | Ht 67.0 in | Wt 232.0 lb

## 2022-02-10 DIAGNOSIS — E559 Vitamin D deficiency, unspecified: Secondary | ICD-10-CM

## 2022-02-10 DIAGNOSIS — E349 Endocrine disorder, unspecified: Secondary | ICD-10-CM | POA: Diagnosis not present

## 2022-02-10 DIAGNOSIS — Z0001 Encounter for general adult medical examination with abnormal findings: Secondary | ICD-10-CM

## 2022-02-10 DIAGNOSIS — M9901 Segmental and somatic dysfunction of cervical region: Secondary | ICD-10-CM | POA: Diagnosis not present

## 2022-02-10 DIAGNOSIS — E282 Polycystic ovarian syndrome: Secondary | ICD-10-CM

## 2022-02-10 DIAGNOSIS — E0590 Thyrotoxicosis, unspecified without thyrotoxic crisis or storm: Secondary | ICD-10-CM

## 2022-02-10 DIAGNOSIS — M7912 Myalgia of auxiliary muscles, head and neck: Secondary | ICD-10-CM | POA: Diagnosis not present

## 2022-02-10 DIAGNOSIS — Z0000 Encounter for general adult medical examination without abnormal findings: Secondary | ICD-10-CM | POA: Diagnosis not present

## 2022-02-10 DIAGNOSIS — M5020 Other cervical disc displacement, unspecified cervical region: Secondary | ICD-10-CM | POA: Diagnosis not present

## 2022-02-10 DIAGNOSIS — Z862 Personal history of diseases of the blood and blood-forming organs and certain disorders involving the immune mechanism: Secondary | ICD-10-CM

## 2022-02-10 DIAGNOSIS — M9902 Segmental and somatic dysfunction of thoracic region: Secondary | ICD-10-CM | POA: Diagnosis not present

## 2022-02-10 NOTE — Progress Notes (Signed)
Complete physical exam   Patient: Caitlyn Ramos   DOB: 1990-04-15   32 y.o. Female  MRN: [id number] Visit Date: 02/10/2022    Chief Complaint  Patient presents with   Annual Exam   Subjective    Caitlyn Ramos is a 32 y.o. female who presents today for a complete physical exam.  She reports consuming a  generally healthy  diet.  She generally feels well. She does not have additional problems to discuss today.   HPI  Annual physical  -due for routine, fasting labs  -history of polycystic ovary syndrome.  -She discussed with previous provider about having reproductive hormones checked. denies chest pain, chest pressure, or shortness of breath. She denies headaches or visual disturbances. She denies abdominal pain, nausea, vomiting, or changes in bowel or bladder habits.    Past Medical History:  Diagnosis Date   Chronic cholecystitis with calculus 02/2021   Fibroid    Generalized headaches    Hyperthyroidism 2020   after pregnancy; took medication for 6 months   PCOS (polycystic ovarian syndrome)    Past Surgical History:  Procedure Laterality Date   CESAREAN SECTION N/A 08/07/2018   Procedure: CESAREAN SECTION;  Surgeon: Linda Hedges, DO;  Location: [facility];  Service: Obstetrics;  Laterality: N/A;   WISDOM TOOTH EXTRACTION  2009 approximate   Social History   Socioeconomic History   Marital status: Married    Spouse name: [name]   Number of children: 2   Years of education: Not on file   Highest education level: Not on file  Occupational History   Not on file  Tobacco Use   Smoking status: Never   Smokeless tobacco: Never  Vaping Use   Vaping Use: Never used  Substance and Sexual Activity   Alcohol use: No   Drug use: Not Currently    Types: Marijuana   Sexual activity: Yes    Partners: Male    Birth control/protection: I.U.D.  Other Topics Concern   Not on file  Social History Narrative   Not on file   Social Determinants of Health   Financial  Resource Strain: Low Risk  (08/07/2018)   Overall Financial Resource Strain (CARDIA)    Difficulty of Paying Living Expenses: Not hard at all  Food Insecurity: No Food Insecurity (08/07/2018)   Hunger Vital Sign    Worried About Running Out of Food in the Last Year: Never true    Ran Out of Food in the Last Year: Never true  Transportation Needs: No Transportation Needs (08/07/2018)   PRAPARE - Hydrologist (Medical): No    Lack of Transportation (Non-Medical): No  Physical Activity: Unknown (08/07/2018)   Exercise Vital Sign    Days of Exercise per Week: Patient refused    Minutes of Exercise per Session: Patient refused  Stress: No Stress Concern Present (08/07/2018)   [place]    Feeling of Stress : Only a little  Social Connections: Unknown (08/07/2018)   Social Connection and Isolation Panel [NHANES]    Frequency of Communication with Friends and Family: Patient refused    Frequency of Social Gatherings with Friends and Family: Patient refused    Attends Religious Services: Patient refused    Active Member of Clubs or Organizations: Patient refused    Attends Archivist Meetings: Patient refused    Marital Status: Patient refused  Intimate Partner Violence: Not At Risk (08/07/2018)  Humiliation, Afraid, Rape, and Kick questionnaire    Fear of Current or Ex-Partner: No    Emotionally Abused: No    Physically Abused: No    Sexually Abused: No   Family Status  Relation Name Status   Mother  Alive   Father  Alive   MGF  Deceased   Brother  Alive   Mat Aunt  Alive   Mat Uncle  Alive   Pat Aunt  Alive   Pat Uncle  Alive   MGM  Alive   [place]  Deceased   PGF  Alive   Family History  Problem Relation Age of Onset   Heart disease Father    Hypertension Father    Thyroid disease Father    Cancer Maternal Grandfather        lung   Early death Maternal Grandfather     Alcohol abuse Maternal Grandmother    COPD Maternal Grandmother    Cancer Paternal Grandmother    Early death Paternal Grandmother    Kidney disease Paternal Grandmother    Diabetes Paternal Grandfather    No Known Allergies  Patient Care Team: Lorrene Reid, PA-C (Inactive) as PCP - General (Physician Assistant) Kate Sable, MD as PCP - Cardiology (Cardiology)   Medications: Outpatient Medications Prior to Visit  Medication Sig   acetaminophen (TYLENOL) 500 MG tablet Take 500-1,000 mg by mouth every 6 (six) hours as needed (for pain.).   butalbital-acetaminophen-caffeine (FIORICET) 50-325-40 MG tablet Take 1 tablet by mouth every 4 (four) hours as needed for migraine.   Levonorgestrel (SKYLA) 13.5 MG IUD 1 each by Intrauterine route See admin instructions. Every 3 years   naproxen sodium (ALEVE) 220 MG tablet Take 220-440 mg by mouth.   Pseudoephedrine-Naproxen Na (ALEVE-D SINUS & COLD PO) Take 1 tablet by mouth as needed.   ondansetron (ZOFRAN-ODT) 4 MG disintegrating tablet Take 1 tablet (4 mg total) by mouth every 6 (six) hours as needed for nausea or vomiting. (Patient not taking: Reported on 02/10/2022)   promethazine (PHENERGAN) 25 MG tablet Take 1 tablet (25 mg total) by mouth every 6 (six) hours as needed for up to 7 days for nausea or vomiting.   No facility-administered medications prior to visit.    Review of Systems  Constitutional:  Negative for activity change, appetite change, chills, fatigue and fever.  HENT:  Negative for congestion, postnasal drip, rhinorrhea, sinus pressure, sinus pain, sneezing and sore throat.   Eyes: Negative.   Respiratory:  Negative for cough, chest tightness, shortness of breath and wheezing.   Cardiovascular:  Negative for chest pain and palpitations.  Gastrointestinal:  Negative for abdominal pain, constipation, diarrhea, nausea and vomiting.  Endocrine: Negative for cold intolerance, heat intolerance, polydipsia and polyuria.   Genitourinary:  Negative for dyspareunia, dysuria, flank pain, frequency and urgency.  Musculoskeletal:  Negative for arthralgias, back pain and myalgias.  Skin:  Negative for rash.  Allergic/Immunologic: Negative for environmental allergies.  Neurological:  Negative for dizziness, weakness and headaches.  Hematological:  Negative for adenopathy.  Psychiatric/Behavioral:  The patient is not nervous/anxious.     Last CBC Lab Results  Component Value Date   WBC 5.0 02/10/2022   HGB 13.3 02/10/2022   HCT 40.3 02/10/2022   MCV 95 02/10/2022   MCH 31.3 02/10/2022   RDW 12.9 02/10/2022   PLT 277 [id number]   Last metabolic panel Lab Results  Component Value Date   GLUCOSE 87 02/10/2022   NA 140 02/10/2022   K 4.6 02/10/2022  CL 102 02/10/2022   CO2 21 02/10/2022   BUN 18 02/10/2022   CREATININE 1.04 (H) 02/10/2022   GFRNONAA >60 02/24/2021   CALCIUM 9.7 02/10/2022   PROT 7.2 02/10/2022   ALBUMIN 4.7 02/10/2022   LABGLOB 2.5 02/10/2022   AGRATIO 1.9 02/10/2022   BILITOT 0.5 02/10/2022   ALKPHOS 50 02/10/2022   AST 18 02/10/2022   ALT 20 02/10/2022   ANIONGAP 8 02/24/2021   Last lipids Lab Results  Component Value Date   CHOL 175 02/10/2022   HDL 59 02/10/2022   LDLCALC 106 (H) 02/10/2022   LDLDIRECT 107 (H) 12/04/2019   TRIG 51 02/10/2022   CHOLHDL 3.0 02/10/2022   Last hemoglobin A1c Lab Results  Component Value Date   HGBA1C 4.9 02/10/2022   Last thyroid functions Lab Results  Component Value Date   TSH 2.330 02/10/2022   T3TOTAL 84 01/09/2021   Last vitamin D Lab Results  Component Value Date   VD25OH 28.5 (L) 02/10/2022       Objective     Today's Vitals   02/10/22 1017  BP: 117/82  Pulse: (Abnormal) 52  Resp: 18  SpO2: 100%  Weight: 232 lb (105.2 kg)  Height: 5' 7"$  (1.702 m)   Body mass index is 36.34 kg/m.  BP Readings from Last 3 Encounters:  02/10/22 117/82  03/20/21 114/72  03/11/21 113/61    Wt Readings from Last 3  Encounters:  02/10/22 232 lb (105.2 kg)  03/20/21 252 lb 3.2 oz (114.4 kg)  03/11/21 254 lb 3.2 oz (115.3 kg)     Physical Exam Vitals and nursing note reviewed.  Constitutional:      Appearance: Normal appearance. She is well-developed.  HENT:     Head: Normocephalic and atraumatic.     Right Ear: Tympanic membrane, ear canal and external ear normal.     Left Ear: Tympanic membrane, ear canal and external ear normal.     Nose: Nose normal.     Mouth/Throat:     Mouth: Mucous membranes are moist.     Pharynx: Oropharynx is clear.  Eyes:     Extraocular Movements: Extraocular movements intact.     Conjunctiva/sclera: Conjunctivae normal.     Pupils: Pupils are equal, round, and reactive to light.  Cardiovascular:     Rate and Rhythm: Normal rate and regular rhythm.     Pulses: Normal pulses.     Heart sounds: Normal heart sounds.  Pulmonary:     Effort: Pulmonary effort is normal.     Breath sounds: Normal breath sounds.  Abdominal:     General: Bowel sounds are normal. There is no distension.     Palpations: Abdomen is soft. There is no mass.     Tenderness: There is no abdominal tenderness. There is no right CVA tenderness, left CVA tenderness, guarding or rebound.     Hernia: No hernia is present.  Musculoskeletal:        General: Normal range of motion.     Cervical back: Normal range of motion and neck supple.  Lymphadenopathy:     Cervical: No cervical adenopathy.  Skin:    General: Skin is warm and dry.     Capillary Refill: Capillary refill takes less than 2 seconds.  Neurological:     General: No focal deficit present.     Mental Status: She is alert and oriented to person, place, and time.  Psychiatric:        Mood and Affect: Mood normal.  Behavior: Behavior normal.        Thought Content: Thought content normal.        Judgment: Judgment normal.    Last depression screening scores   Row Labels 02/10/2022   10:19 AM 01/02/2021    3:05 PM 12/04/2019     1:54 PM  PHQ 2/9 Scores   Section Header. No data exists in this row.     PHQ - 2 Score   0 0 0  PHQ- 9 Score   0 0 0   Last fall risk screening   Row Labels 02/10/2022   10:19 AM  Fall Risk    Section Header. No data exists in this row.   Falls in the past year?   0  Number falls in past yr:   0  Injury with Fall?   0    Results for orders placed or performed in visit on 02/10/22  Testosterone, free  Result Value Ref Range   Testosterone, Free 1.2 0.0 - 4.2 pg/mL  Testosterone, % free  Result Value Ref Range   Testosterone-% Free 0.9 %  Sex hormone binding globulin  Result Value Ref Range   Sex Hormone Binding 77.4 24.6 - 122.0 nmol/L  Ferritin  Result Value Ref Range   Ferritin 51 15 - 150 ng/mL  Results for orders placed or performed in visit on 02/10/22  TSH + free T4  Result Value Ref Range   TSH 2.330 0.450 - 4.500 uIU/mL   Free T4 1.38 0.82 - 1.77 ng/dL  VITAMIN D 25 Hydroxy (Vit-D Deficiency, Fractures)  Result Value Ref Range   Vit D, 25-Hydroxy 28.5 (L) 30.0 - 100.0 ng/mL  Hemoglobin A1c  Result Value Ref Range   Hgb A1c MFr Bld 4.9 4.8 - 5.6 %   Est. average glucose Bld gHb Est-mCnc 94 mg/dL  Lipid panel  Result Value Ref Range   Cholesterol, Total 175 100 - 199 mg/dL   Triglycerides 51 0 - 149 mg/dL   HDL 59 >39 mg/dL   VLDL Cholesterol Cal 10 5 - 40 mg/dL   LDL Chol Calc (NIH) 106 (H) 0 - 99 mg/dL   Chol/HDL Ratio 3.0 0.0 - 4.4 ratio  Comprehensive metabolic panel  Result Value Ref Range   Glucose 87 70 - 99 mg/dL   BUN 18 6 - 20 mg/dL   Creatinine, Ser 1.04 (H) 0.57 - 1.00 mg/dL   eGFR 74 >59 mL/min/1.73   BUN/Creatinine Ratio 17 9 - 23   Sodium 140 134 - 144 mmol/L   Potassium 4.6 3.5 - 5.2 mmol/L   Chloride 102 96 - 106 mmol/L   CO2 21 20 - 29 mmol/L   Calcium 9.7 8.7 - 10.2 mg/dL   Total Protein 7.2 6.0 - 8.5 g/dL   Albumin 4.7 4.0 - 5.0 g/dL   Globulin, Total 2.5 1.5 - 4.5 g/dL   Albumin/Globulin Ratio 1.9 1.2 - 2.2   Bilirubin  Total 0.5 0.0 - 1.2 mg/dL   Alkaline Phosphatase 50 44 - 121 IU/L   AST 18 0 - 40 IU/L   ALT 20 0 - 32 IU/L  CBC  Result Value Ref Range   WBC 5.0 3.4 - 10.8 x10E3/uL   RBC 4.25 3.77 - 5.28 x10E6/uL   Hemoglobin 13.3 11.1 - 15.9 g/dL   Hematocrit 40.3 34.0 - 46.6 %   MCV 95 79 - 97 fL   MCH 31.3 26.6 - 33.0 pg   MCHC 33.0 31.5 - 35.7 g/dL  RDW 12.9 11.7 - 15.4 %   Platelets 277 150 - 450 x10E3/uL  FSH/LH  Result Value Ref Range   LH 7.4 mIU/mL   FSH 3.7 mIU/mL  Estradiol  Result Value Ref Range   Estradiol 146.0 pg/mL  Testosterone  Result Value Ref Range   Testosterone 45 13 - 71 ng/dL  Progesterone  Result Value Ref Range   Progesterone 14.8 ng/mL  Thyroid peroxidase antibody  Result Value Ref Range   Thyroperoxidase Ab SerPl-aCnc 63 (H) 0 - 34 IU/mL  Thyroglobulin Level  Result Value Ref Range   Thyroglobulin (TG-RIA) 15 ng/mL    Assessment & Plan      1. Encounter for general adult medical examination with abnormal findings Annual physical today   2. Thyrotoxicosis without thyroid storm, unspecified thyrotoxicosis type Check testosterone and thyroid function panel for further evaluation.  - Testosterone, free - Testosterone, % free - Sex hormone binding globulin  3. Polycystic ovary syndrome Check sex hormone  panel for further evaluation.  - Testosterone, free - Testosterone, % free - Sex hormone binding globulin  4. Hormone imbalance Check reproductive hormones and sex binding hormones  - Testosterone, free - Testosterone, % free - Sex hormone binding globulin  5. History of iron deficiency anemia Check  ferritin  along with cbc for further  evaluation.   - Ferritin    Immunization History  Administered Date(s) Administered   Influenza,inj,Quad PF,6+ Mos 12/04/2019, 01/02/2021   Tdap 07/15/2018    Health Maintenance  Topic Date Due   COVID-19 Vaccine (1) Never done   Hepatitis C Screening  Never done   INFLUENZA VACCINE  08/26/2021    PAP SMEAR-Modifier  10/11/2022   DTaP/Tdap/Td (2 - Td or Tdap) 07/14/2028   HIV Screening  Completed   HPV VACCINES  Aged Out    Discussed health benefits of physical activity, and encouraged her to engage in regular exercise appropriate for her age and condition.  Problem List Items Addressed This Visit       Endocrine   Thyrotoxicosis without thyroid storm   Relevant Orders   Testosterone, free (Completed)   Testosterone, % free (Completed)   Sex hormone binding globulin (Completed)   Polycystic ovary syndrome   Relevant Orders   Testosterone, free (Completed)   Testosterone, % free (Completed)   Sex hormone binding globulin (Completed)   Other Visit Diagnoses     Encounter for general adult medical examination with abnormal findings    -  Primary   Hormone imbalance       Relevant Orders   Testosterone, free (Completed)   Testosterone, % free (Completed)   Sex hormone binding globulin (Completed)   History of iron deficiency anemia       Relevant Orders   Ferritin (Completed)        Return in about 1 year (around 02/11/2023) for health maintenance exam, FBW a week prior to visit.        Ronnell Freshwater, NP  [facility] at [facility] [phone] (phone) [phone] (fax)  [place]

## 2022-02-11 ENCOUNTER — Encounter: Payer: Self-pay | Admitting: Nurse Practitioner

## 2022-02-11 NOTE — Progress Notes (Signed)
Waiting on all results

## 2022-02-11 NOTE — Progress Notes (Signed)
My chart message sent to patient regarding labs.

## 2022-02-17 LAB — COMPREHENSIVE METABOLIC PANEL
ALT: 20 IU/L (ref 0–32)
AST: 18 IU/L (ref 0–40)
Albumin/Globulin Ratio: 1.9 (ref 1.2–2.2)
Albumin: 4.7 g/dL (ref 4.0–5.0)
Alkaline Phosphatase: 50 IU/L (ref 44–121)
BUN/Creatinine Ratio: 17 (ref 9–23)
BUN: 18 mg/dL (ref 6–20)
Bilirubin Total: 0.5 mg/dL (ref 0.0–1.2)
CO2: 21 mmol/L (ref 20–29)
Calcium: 9.7 mg/dL (ref 8.7–10.2)
Chloride: 102 mmol/L (ref 96–106)
Creatinine, Ser: 1.04 mg/dL — ABNORMAL HIGH (ref 0.57–1.00)
Globulin, Total: 2.5 g/dL (ref 1.5–4.5)
Glucose: 87 mg/dL (ref 70–99)
Potassium: 4.6 mmol/L (ref 3.5–5.2)
Sodium: 140 mmol/L (ref 134–144)
Total Protein: 7.2 g/dL (ref 6.0–8.5)
eGFR: 74 mL/min/{1.73_m2} (ref >59)

## 2022-02-17 LAB — ESTRADIOL: Estradiol: 146 pg/mL

## 2022-02-17 LAB — LIPID PANEL
Chol/HDL Ratio: 3 ratio (ref 0.0–4.4)
Cholesterol, Total: 175 mg/dL (ref 100–199)
HDL: 59 mg/dL (ref >39)
LDL Chol Calc (NIH): 106 mg/dL — ABNORMAL HIGH (ref 0–99)
Triglycerides: 51 mg/dL (ref 0–149)
VLDL Cholesterol Cal: 10 mg/dL (ref 5–40)

## 2022-02-17 LAB — FSH/LH
FSH: 3.7 m[IU]/mL
LH: 7.4 m[IU]/mL

## 2022-02-17 LAB — CBC
Hematocrit: 40.3 % (ref 34.0–46.6)
Hemoglobin: 13.3 g/dL (ref 11.1–15.9)
MCH: 31.3 pg (ref 26.6–33.0)
MCHC: 33 g/dL (ref 31.5–35.7)
MCV: 95 fL (ref 79–97)
Platelets: 277 10*3/uL (ref 150–450)
RBC: 4.25 x10E6/uL (ref 3.77–5.28)
RDW: 12.9 % (ref 11.7–15.4)
WBC: 5 10*3/uL (ref 3.4–10.8)

## 2022-02-17 LAB — PROGESTERONE: Progesterone: 14.8 ng/mL

## 2022-02-17 LAB — TSH+FREE T4
Free T4: 1.38 ng/dL (ref 0.82–1.77)
TSH: 2.33 u[IU]/mL (ref 0.450–4.500)

## 2022-02-17 LAB — HEMOGLOBIN A1C
Est. average glucose Bld gHb Est-mCnc: 94 mg/dL
Hgb A1c MFr Bld: 4.9 % (ref 4.8–5.6)

## 2022-02-17 LAB — THYROID PEROXIDASE ANTIBODY: Thyroperoxidase Ab SerPl-aCnc: 63 IU/mL — ABNORMAL HIGH (ref 0–34)

## 2022-02-17 LAB — THYROGLOBULIN LEVEL: Thyroglobulin (TG-RIA): 15 ng/mL

## 2022-02-17 LAB — TESTOSTERONE: Testosterone: 45 ng/dL (ref 13–71)

## 2022-02-17 LAB — VITAMIN D 25 HYDROXY (VIT D DEFICIENCY, FRACTURES): Vit D, 25-Hydroxy: 28.5 ng/mL — ABNORMAL LOW (ref 30.0–100.0)

## 2022-02-18 LAB — TESTOSTERONE, FREE: Testosterone, Free: 1.2 pg/mL (ref 0.0–4.2)

## 2022-02-18 LAB — FERRITIN: Ferritin: 51 ng/mL (ref 15–150)

## 2022-02-18 LAB — SEX HORMONE BINDING GLOBULIN: Sex Hormone Binding: 77.4 nmol/L (ref 24.6–122.0)

## 2022-02-18 LAB — TESTOSTERONE, % FREE: Testosterone-% Free: 0.9 %

## 2022-02-18 NOTE — Progress Notes (Signed)
Please let the patient know that her labs, testosterone levels, sex binding hormones, and ferritin, all appear to be within normal limits. Thanks  -HB

## 2022-03-15 ENCOUNTER — Encounter: Payer: Self-pay | Admitting: Nurse Practitioner

## 2022-12-02 ENCOUNTER — Other Ambulatory Visit: Payer: Self-pay | Admitting: Internal Medicine

## 2022-12-02 DIAGNOSIS — F4323 Adjustment disorder with mixed anxiety and depressed mood: Secondary | ICD-10-CM | POA: Diagnosis not present

## 2022-12-02 DIAGNOSIS — Z8249 Family history of ischemic heart disease and other diseases of the circulatory system: Secondary | ICD-10-CM

## 2022-12-02 NOTE — Progress Notes (Signed)
Echo ordered per Dr. Izora Ribas.  See staff message exchange below for details: Christell Constant, MD  Lorie Phenix; P Cv Div [facility] Triage That would be fine Indication is family history of cardiomyopathy/family history of hypertrophic cardiomyopathy  Thanks, MAC   ----- Message ----- From: Lorie Phenix Sent: 12/02/2022   1:45 PM EST To: Christell Constant, MD Subject: echo                                          Caitlyn Ramos called stating her father [facility])) is a patient of yours, and you have advised her to get an echo done. She would like to schedule that however we need an order so she can be scheduled.  Thank you

## 2022-12-08 ENCOUNTER — Ambulatory Visit (HOSPITAL_COMMUNITY): Payer: BC Managed Care – PPO | Attending: Internal Medicine

## 2022-12-08 DIAGNOSIS — Z8249 Family history of ischemic heart disease and other diseases of the circulatory system: Secondary | ICD-10-CM | POA: Insufficient documentation

## 2022-12-08 DIAGNOSIS — E669 Obesity, unspecified: Secondary | ICD-10-CM | POA: Diagnosis not present

## 2022-12-08 DIAGNOSIS — E282 Polycystic ovarian syndrome: Secondary | ICD-10-CM | POA: Insufficient documentation

## 2022-12-08 DIAGNOSIS — F4323 Adjustment disorder with mixed anxiety and depressed mood: Secondary | ICD-10-CM | POA: Diagnosis not present

## 2022-12-08 LAB — ECHOCARDIOGRAM COMPLETE
Area-P 1/2: 3.99 cm2
S' Lateral: 3.2 cm

## 2022-12-08 NOTE — Progress Notes (Unsigned)
e

## 2022-12-14 DIAGNOSIS — F4323 Adjustment disorder with mixed anxiety and depressed mood: Secondary | ICD-10-CM | POA: Diagnosis not present

## 2023-01-11 DIAGNOSIS — F4323 Adjustment disorder with mixed anxiety and depressed mood: Secondary | ICD-10-CM | POA: Diagnosis not present

## 2023-01-25 DIAGNOSIS — F4323 Adjustment disorder with mixed anxiety and depressed mood: Secondary | ICD-10-CM | POA: Diagnosis not present

## 2023-01-28 ENCOUNTER — Other Ambulatory Visit: Payer: Self-pay

## 2023-01-28 DIAGNOSIS — Z0000 Encounter for general adult medical examination without abnormal findings: Secondary | ICD-10-CM

## 2023-01-28 DIAGNOSIS — E669 Obesity, unspecified: Secondary | ICD-10-CM

## 2023-02-05 ENCOUNTER — Other Ambulatory Visit: Payer: BC Managed Care – PPO

## 2023-02-05 DIAGNOSIS — E669 Obesity, unspecified: Secondary | ICD-10-CM | POA: Diagnosis not present

## 2023-02-05 DIAGNOSIS — Z0000 Encounter for general adult medical examination without abnormal findings: Secondary | ICD-10-CM

## 2023-02-06 LAB — COMPREHENSIVE METABOLIC PANEL
ALT: 24 [IU]/L (ref 0–32)
AST: 19 [IU]/L (ref 0–40)
Albumin: 4.8 g/dL (ref 3.9–4.9)
Alkaline Phosphatase: 50 [IU]/L (ref 44–121)
BUN/Creatinine Ratio: 13 (ref 9–23)
BUN: 13 mg/dL (ref 6–20)
Bilirubin Total: 0.7 mg/dL (ref 0.0–1.2)
CO2: 23 mmol/L (ref 20–29)
Calcium: 9.4 mg/dL (ref 8.7–10.2)
Chloride: 101 mmol/L (ref 96–106)
Creatinine, Ser: 1.01 mg/dL — ABNORMAL HIGH (ref 0.57–1.00)
Globulin, Total: 2.3 g/dL (ref 1.5–4.5)
Glucose: 86 mg/dL (ref 70–99)
Potassium: 4.1 mmol/L (ref 3.5–5.2)
Sodium: 138 mmol/L (ref 134–144)
Total Protein: 7.1 g/dL (ref 6.0–8.5)
eGFR: 76 mL/min/{1.73_m2} (ref >59)

## 2023-02-06 LAB — CBC WITH DIFFERENTIAL/PLATELET
Basophils Absolute: 0 10*3/uL (ref 0.0–0.2)
Basos: 0 %
EOS (ABSOLUTE): 0.1 10*3/uL (ref 0.0–0.4)
Eos: 2 %
Hematocrit: 39 % (ref 34.0–46.6)
Hemoglobin: 12.9 g/dL (ref 11.1–15.9)
Immature Grans (Abs): 0 10*3/uL (ref 0.0–0.1)
Immature Granulocytes: 0 %
Lymphocytes Absolute: 1.6 10*3/uL (ref 0.7–3.1)
Lymphs: 31 %
MCH: 31.9 pg (ref 26.6–33.0)
MCHC: 33.1 g/dL (ref 31.5–35.7)
MCV: 96 fL (ref 79–97)
Monocytes Absolute: 0.3 10*3/uL (ref 0.1–0.9)
Monocytes: 6 %
Neutrophils Absolute: 3.2 10*3/uL (ref 1.4–7.0)
Neutrophils: 61 %
Platelets: 301 10*3/uL (ref 150–450)
RBC: 4.05 x10E6/uL (ref 3.77–5.28)
RDW: 12.4 % (ref 11.7–15.4)
WBC: 5.3 10*3/uL (ref 3.4–10.8)

## 2023-02-06 LAB — LIPID PANEL
Chol/HDL Ratio: 2.4 {ratio} (ref 0.0–4.4)
Cholesterol, Total: 191 mg/dL (ref 100–199)
HDL: 78 mg/dL (ref >39)
LDL Chol Calc (NIH): 105 mg/dL — ABNORMAL HIGH (ref 0–99)
Triglycerides: 39 mg/dL (ref 0–149)
VLDL Cholesterol Cal: 8 mg/dL (ref 5–40)

## 2023-02-06 LAB — TSH: TSH: 1.93 u[IU]/mL (ref 0.450–4.500)

## 2023-02-06 LAB — HEMOGLOBIN A1C
Est. average glucose Bld gHb Est-mCnc: 97 mg/dL
Hgb A1c MFr Bld: 5 % (ref 4.8–5.6)

## 2023-02-08 DIAGNOSIS — F4323 Adjustment disorder with mixed anxiety and depressed mood: Secondary | ICD-10-CM | POA: Diagnosis not present

## 2023-02-12 ENCOUNTER — Ambulatory Visit (INDEPENDENT_AMBULATORY_CARE_PROVIDER_SITE_OTHER): Payer: BC Managed Care – PPO | Admitting: Family Medicine

## 2023-02-12 ENCOUNTER — Encounter: Payer: Self-pay | Admitting: Family Medicine

## 2023-02-12 VITALS — BP 130/84 | HR 72 | Ht 67.0 in | Wt 229.8 lb

## 2023-02-12 DIAGNOSIS — Z0000 Encounter for general adult medical examination without abnormal findings: Secondary | ICD-10-CM | POA: Diagnosis not present

## 2023-02-12 NOTE — Progress Notes (Signed)
Complete physical exam  Patient: Caitlyn Ramos   DOB: 1990-04-25   32 y.o. Female  MRN: [id number]  Subjective:    Chief Complaint  Patient presents with   Annual Exam    Caitlyn Ramos is a 33 y.o. female who presents today for a complete physical exam. She reports consuming a  high-protein, low-carb  diet.  She participates in power lifting and lifts very heavy weights.  She generally feels well. She reports sleeping well. She does not have additional problems to discuss today.    Most recent fall risk assessment:    02/10/2022   10:19 AM  Fall Risk   Falls in the past year? 0  Number falls in past yr: 0  Injury with Fall? 0     Most recent depression and anxiety screenings:    02/10/2022   10:19 AM 01/02/2021    3:05 PM  PHQ 2/9 Scores  PHQ - 2 Score 0 0  PHQ- 9 Score 0 0      02/10/2022   10:19 AM 01/02/2021    3:06 PM  GAD 7 : Generalized Anxiety Score  Nervous, Anxious, on Edge 0 0  Control/stop worrying 0 0  Worry too much - different things 0 0  Trouble relaxing 0 0  Restless 0 0  Easily annoyed or irritable 0 0  Afraid - awful might happen 0 0  Total GAD 7 Score 0 0  Anxiety Difficulty Not difficult at all Not difficult at all    Patient Active Problem List   Diagnosis Date Noted   Polycystic ovary syndrome 12/04/2019   Obesity (BMI 35.0-39.9 without comorbidity) 07/14/2016    Past Surgical History:  Procedure Laterality Date   CESAREAN SECTION N/A 08/07/2018   Procedure: CESAREAN SECTION;  Surgeon: Mitchel Honour, DO;  Location: [facility];  Service: Obstetrics;  Laterality: N/A;   WISDOM TOOTH EXTRACTION  2009 approximate   Social History   Tobacco Use   Smoking status: Never   Smokeless tobacco: Never  Vaping Use   Vaping status: Never Used  Substance Use Topics   Alcohol use: No   Drug use: Not Currently    Types: Marijuana   Family History  Problem Relation Age of Onset   Heart disease Father    Hypertension Father    Thyroid disease  Father    Cancer Maternal Grandfather        lung   Early death Maternal Grandfather    Alcohol abuse Maternal Grandmother    COPD Maternal Grandmother    Cancer Paternal Grandmother    Early death Paternal Grandmother    Kidney disease Paternal Grandmother    Diabetes Paternal Grandfather    No Known Allergies   Patient Care Team: Melida Quitter, PA as PCP - General (Family Medicine) Debbe Odea, MD as PCP - Cardiology (Cardiology)   Outpatient Medications Prior to Visit  Medication Sig   acetaminophen (TYLENOL) 500 MG tablet Take 500-1,000 mg by mouth every 6 (six) hours as needed (for pain.).   naproxen sodium (ALEVE) 220 MG tablet Take 220-440 mg by mouth.   [DISCONTINUED] butalbital-acetaminophen-caffeine (FIORICET) 50-325-40 MG tablet Take 1 tablet by mouth every 4 (four) hours as needed for migraine.   [DISCONTINUED] Pseudoephedrine-Naproxen Na (ALEVE-D SINUS & COLD PO) Take 1 tablet by mouth as needed.   No facility-administered medications prior to visit.    Review of Systems  Constitutional:  Negative for chills, fever and malaise/fatigue.  HENT:  Negative  for congestion and hearing loss.   Eyes:  Negative for blurred vision and double vision.  Respiratory:  Negative for cough and shortness of breath.   Cardiovascular:  Negative for chest pain, palpitations and leg swelling.  Gastrointestinal:  Negative for abdominal pain, constipation, diarrhea and heartburn.  Genitourinary:  Negative for frequency and urgency.  Musculoskeletal:  Negative for myalgias and neck pain.  Neurological:  Negative for headaches.  Endo/Heme/Allergies:  Negative for polydipsia.  Psychiatric/Behavioral:  Negative for depression. The patient is not nervous/anxious and does not have insomnia.       Objective:    BP 130/84   Pulse 72   Ht 5\' 7"  (1.702 m)   Wt 229 lb 12.8 oz (104.2 kg)   LMP 01/13/2023   SpO2 (!) 72%   BMI 35.99 kg/m    Physical Exam Constitutional:       General: She is not in acute distress.    Appearance: Normal appearance.  HENT:     Head: Normocephalic and atraumatic.     Right Ear: Tympanic membrane, ear canal and external ear normal.     Left Ear: Tympanic membrane, ear canal and external ear normal.     Nose: Nose normal.     Mouth/Throat:     Mouth: Mucous membranes are moist.     Pharynx: No oropharyngeal exudate or posterior oropharyngeal erythema.  Eyes:     Extraocular Movements: Extraocular movements intact.     Conjunctiva/sclera: Conjunctivae normal.     Pupils: Pupils are equal, round, and reactive to light.     Comments: Patient wearing glasses today but usually wears contacts  Neck:     Thyroid: No thyroid mass, thyromegaly or thyroid tenderness.  Cardiovascular:     Rate and Rhythm: Normal rate and regular rhythm.     Heart sounds: Normal heart sounds. No murmur heard.    No friction rub. No gallop.  Pulmonary:     Effort: Pulmonary effort is normal. No respiratory distress.     Breath sounds: Normal breath sounds. No wheezing, rhonchi or rales.  Abdominal:     General: Abdomen is flat. Bowel sounds are normal. There is no distension.     Palpations: There is no mass.     Tenderness: There is no abdominal tenderness. There is no guarding.  Musculoskeletal:        General: Normal range of motion.     Cervical back: Normal range of motion and neck supple.  Lymphadenopathy:     Cervical: No cervical adenopathy.  Skin:    General: Skin is warm and dry.  Neurological:     Mental Status: She is alert and oriented to person, place, and time.     Cranial Nerves: No cranial nerve deficit.     Motor: No weakness.     Deep Tendon Reflexes: Reflexes normal.  Psychiatric:        Mood and Affect: Mood normal.        Assessment & Plan:    Routine Health Maintenance and Physical Exam  Immunization History  Administered Date(s) Administered   Influenza,inj,Quad PF,6+ Mos 12/04/2019, 01/02/2021   Tdap 07/15/2018     Health Maintenance  Topic Date Due   COVID-19 Vaccine (1) Never done   Hepatitis C Screening  Never done   Cervical Cancer Screening (HPV/Pap Cotest)  Never done   INFLUENZA VACCINE  08/27/2022   DTaP/Tdap/Td (2 - Td or Tdap) 07/14/2028   HIV Screening  Completed   HPV VACCINES  Aged Out    Reviewed most recent labs including CBC, CMP, lipid panel, A1C, TSH, and vitamin D. All within normal limits/stable from last check other than LDL stable 105, creatinine 1.01 but improved from 12 months ago. She has an upcoming appointment with her OB/GYN to update her Pap smear. She believes that she received the flu vaccine at [facility]  Discussed health benefits of physical activity, and encouraged her to engage in regular exercise appropriate for her age and condition.  Wellness examination    Patient is moving and will be establishing with new PCP.     Melida Quitter, PA

## 2023-02-22 DIAGNOSIS — F4323 Adjustment disorder with mixed anxiety and depressed mood: Secondary | ICD-10-CM | POA: Diagnosis not present

## 2023-02-24 DIAGNOSIS — Z124 Encounter for screening for malignant neoplasm of cervix: Secondary | ICD-10-CM | POA: Diagnosis not present

## 2023-02-24 DIAGNOSIS — Z6836 Body mass index (BMI) 36.0-36.9, adult: Secondary | ICD-10-CM | POA: Diagnosis not present

## 2023-02-24 DIAGNOSIS — Z01419 Encounter for gynecological examination (general) (routine) without abnormal findings: Secondary | ICD-10-CM | POA: Diagnosis not present

## 2023-02-24 DIAGNOSIS — N760 Acute vaginitis: Secondary | ICD-10-CM | POA: Diagnosis not present

## 2023-03-01 DIAGNOSIS — F4323 Adjustment disorder with mixed anxiety and depressed mood: Secondary | ICD-10-CM | POA: Diagnosis not present

## 2023-03-04 ENCOUNTER — Encounter: Payer: Self-pay | Admitting: Family Medicine

## 2023-03-22 DIAGNOSIS — F4323 Adjustment disorder with mixed anxiety and depressed mood: Secondary | ICD-10-CM | POA: Diagnosis not present

## 2023-03-31 DIAGNOSIS — F4323 Adjustment disorder with mixed anxiety and depressed mood: Secondary | ICD-10-CM | POA: Diagnosis not present

## 2023-04-12 DIAGNOSIS — F4323 Adjustment disorder with mixed anxiety and depressed mood: Secondary | ICD-10-CM | POA: Diagnosis not present

## 2023-04-26 DIAGNOSIS — F4323 Adjustment disorder with mixed anxiety and depressed mood: Secondary | ICD-10-CM | POA: Diagnosis not present

## 2023-05-03 DIAGNOSIS — F4323 Adjustment disorder with mixed anxiety and depressed mood: Secondary | ICD-10-CM | POA: Diagnosis not present

## 2023-05-24 DIAGNOSIS — F4323 Adjustment disorder with mixed anxiety and depressed mood: Secondary | ICD-10-CM | POA: Diagnosis not present

## 2023-05-31 DIAGNOSIS — F4323 Adjustment disorder with mixed anxiety and depressed mood: Secondary | ICD-10-CM | POA: Diagnosis not present

## 2023-06-14 DIAGNOSIS — F4323 Adjustment disorder with mixed anxiety and depressed mood: Secondary | ICD-10-CM | POA: Diagnosis not present

## 2023-06-22 DIAGNOSIS — F4323 Adjustment disorder with mixed anxiety and depressed mood: Secondary | ICD-10-CM | POA: Diagnosis not present

## 2023-07-06 DIAGNOSIS — F4323 Adjustment disorder with mixed anxiety and depressed mood: Secondary | ICD-10-CM | POA: Diagnosis not present

## 2023-07-13 DIAGNOSIS — F4323 Adjustment disorder with mixed anxiety and depressed mood: Secondary | ICD-10-CM | POA: Diagnosis not present

## 2023-08-03 DIAGNOSIS — F4323 Adjustment disorder with mixed anxiety and depressed mood: Secondary | ICD-10-CM | POA: Diagnosis not present

## 2023-08-18 DIAGNOSIS — F4323 Adjustment disorder with mixed anxiety and depressed mood: Secondary | ICD-10-CM | POA: Diagnosis not present

## 2023-08-24 DIAGNOSIS — F4323 Adjustment disorder with mixed anxiety and depressed mood: Secondary | ICD-10-CM | POA: Diagnosis not present

## 2023-09-01 DIAGNOSIS — F4323 Adjustment disorder with mixed anxiety and depressed mood: Secondary | ICD-10-CM | POA: Diagnosis not present

## 2023-09-03 DIAGNOSIS — Z1330 Encounter for screening examination for mental health and behavioral disorders, unspecified: Secondary | ICD-10-CM | POA: Diagnosis not present

## 2023-09-03 DIAGNOSIS — F900 Attention-deficit hyperactivity disorder, predominantly inattentive type: Secondary | ICD-10-CM | POA: Diagnosis not present

## 2023-09-07 DIAGNOSIS — Z1330 Encounter for screening examination for mental health and behavioral disorders, unspecified: Secondary | ICD-10-CM | POA: Diagnosis not present

## 2023-09-07 DIAGNOSIS — F900 Attention-deficit hyperactivity disorder, predominantly inattentive type: Secondary | ICD-10-CM | POA: Diagnosis not present

## 2023-09-08 DIAGNOSIS — H00014 Hordeolum externum left upper eyelid: Secondary | ICD-10-CM | POA: Diagnosis not present

## 2023-09-08 DIAGNOSIS — Z1330 Encounter for screening examination for mental health and behavioral disorders, unspecified: Secondary | ICD-10-CM | POA: Diagnosis not present

## 2023-09-10 DIAGNOSIS — F900 Attention-deficit hyperactivity disorder, predominantly inattentive type: Secondary | ICD-10-CM | POA: Diagnosis not present

## 2023-09-10 DIAGNOSIS — Z1330 Encounter for screening examination for mental health and behavioral disorders, unspecified: Secondary | ICD-10-CM | POA: Diagnosis not present

## 2023-09-14 DIAGNOSIS — F4323 Adjustment disorder with mixed anxiety and depressed mood: Secondary | ICD-10-CM | POA: Diagnosis not present

## 2023-09-28 DIAGNOSIS — F4323 Adjustment disorder with mixed anxiety and depressed mood: Secondary | ICD-10-CM | POA: Diagnosis not present

## 2023-10-05 DIAGNOSIS — D210 Benign neoplasm of connective and other soft tissue of head, face and neck: Secondary | ICD-10-CM | POA: Diagnosis not present

## 2023-10-05 DIAGNOSIS — H0014 Chalazion left upper eyelid: Secondary | ICD-10-CM | POA: Diagnosis not present

## 2023-10-05 DIAGNOSIS — H0279 Other degenerative disorders of eyelid and periocular area: Secondary | ICD-10-CM | POA: Diagnosis not present

## 2023-10-05 DIAGNOSIS — F4323 Adjustment disorder with mixed anxiety and depressed mood: Secondary | ICD-10-CM | POA: Diagnosis not present

## 2023-10-06 DIAGNOSIS — F900 Attention-deficit hyperactivity disorder, predominantly inattentive type: Secondary | ICD-10-CM | POA: Diagnosis not present

## 2023-10-08 ENCOUNTER — Telehealth: Admitting: Physician Assistant

## 2023-10-08 DIAGNOSIS — B379 Candidiasis, unspecified: Secondary | ICD-10-CM | POA: Diagnosis not present

## 2023-10-08 DIAGNOSIS — L0100 Impetigo, unspecified: Secondary | ICD-10-CM

## 2023-10-08 DIAGNOSIS — T3695XA Adverse effect of unspecified systemic antibiotic, initial encounter: Secondary | ICD-10-CM

## 2023-10-08 MED ORDER — FLUCONAZOLE 150 MG PO TABS: 150.0000 mg | ORAL_TABLET | ORAL | 0 refills | Status: DC | PRN

## 2023-10-08 MED ORDER — CEPHALEXIN 500 MG PO CAPS: 500.0000 mg | ORAL_CAPSULE | Freq: Two times a day (BID) | ORAL | 0 refills | Status: AC

## 2023-10-08 NOTE — Patient Instructions (Signed)
 Caitlyn Ramos, thank you for joining Delon Caitlyn Dickinson, PA-C for today's virtual visit.  While this provider is not your primary care provider (PCP), if your PCP is located in our provider database this encounter information will be shared with them immediately following your visit.   A [facility] MyChart account gives you access to today's visit and all your visits, tests, and labs performed at [facility]  click here if you don't have a [facility] MyChart account or go to [email or web address]  Consent: (Patient) Caitlyn Ramos provided verbal consent for this virtual visit at the beginning of the encounter.  Current Medications:  Current Outpatient Medications:    cephALEXin  (KEFLEX ) 500 MG capsule, Take 1 capsule (500 mg total) by mouth 2 (two) times daily., Disp: 14 capsule, Rfl: 0   fluconazole  (DIFLUCAN ) 150 MG tablet, Take 1 tablet (150 mg total) by mouth every 3 (three) days as needed., Disp: 3 tablet, Rfl: 0   acetaminophen  (TYLENOL ) 500 MG tablet, Take 500-1,000 mg by mouth every 6 (six) hours as needed (for pain.)., Disp: , Rfl:    naproxen sodium (ALEVE) 220 MG tablet, Take 220-440 mg by mouth., Disp: , Rfl:    Medications ordered in this encounter:  Meds ordered this encounter  Medications   cephALEXin  (KEFLEX ) 500 MG capsule    Sig: Take 1 capsule (500 mg total) by mouth 2 (two) times daily.    Dispense:  14 capsule    Refill:  0    Supervising Provider:   LAMPTEY, PHILIP O [[id number]]   fluconazole  (DIFLUCAN ) 150 MG tablet    Sig: Take 1 tablet (150 mg total) by mouth every 3 (three) days as needed.    Dispense:  3 tablet    Refill:  0    Supervising Provider:   LAMPTEY, PHILIP O [[id number]]     *If you need refills on other medications prior to your next appointment, please contact your pharmacy*  Follow-Up: Call back or seek an in-person evaluation if the symptoms worsen or if the condition fails to improve as anticipated.  [facility] [phone]  Other Instructions  Skin Infection (Impetigo) in Adults: What to Know Impetigo is an infection of the skin. It commonly occurs in young children, but it can also occur in adults. There are two types of impetigo: Bullous. Nonbullous. Ecthyma is a skin problem that is like impetigo but more serious. It is usually caused by the same bacteria. Ecthyma causes deep sores on the skin that can leave scars. Sometimes, people call it deep impetigo. Impetigo usually goes away in 7-10 days with treatment. What are the causes? This condition is caused by two types of germs (bacteria). Nonbullous impetigo can be caused by staphylococci or streptococci. Bullous impetigo is caused by staphylococci. These bacteria cause impetigo when they get under the surface of the skin. Impetigo is contagious, which means it spreads easily from person to person. It may be spread through close skin contact or by sharing towels, clothing, or other items that an infected person has touched. Scratching the affected area can cause impetigo to spread to other parts of the body. The bacteria can get under your fingernails and spread when you touch another area of your skin. What increases the risk? The following factors may make you more likely to get this condition: Playing sports that include skin-to-skin contact with others. Having broken skin, such as from a cut, scrape, insect bite, or rash. Living in an area  that has high humidity levels. Having poor hygiene. Having high levels of staphylococci in your nose. Having a condition that weakens the skin, such as: Having a weak body defense system (immune system). Having a skin condition with open sores, such as chickenpox. Having diabetes. What are the signs or symptoms? Impetigo causes itchy blisters and sores. Your symptoms will depend on the type of impetigo you have. In some cases, the blisters can cause itching or burning. Bullous impetigo You might  get big blisters that break open and leak yellow fluid. These blisters usually appear on your belly, arms, legs, under your arms, or in your groin area. Nonbullous impetigo You might get small blisters that break open and leak, making a yellow crust. These blisters are often found on your face, arms, or legs. Ecthyma You might have deep sores that break open and leak, forming a black or brown crust. How is this diagnosed? Impetigo may be diagnosed with an exam. Your health care provider will look at the sores. In some cases, a swab of the fluid from the sores may be taken. How is this treated? Treatment for this condition depends on how bad your symptoms are Mild impetigo can be treated with prescription antibiotic cream. Oral antibiotic medicine may be used in really bad cases. Medicines that help with itching (antihistamines)may also be used. Follow these instructions at home: Medicines Take medicines only as told. If you were given antibiotic cream or ointment, use it for the time you were told. Do not stop using it sooner even if you start to feel better. Before applying antibiotic cream or ointment, you should: Gently wash the infected areas with antibacterial soap and warm water. Soak crusted areas in warm, soapy water using antibacterial soap. Gently rub the areas to remove crusts. Do not scrub. Preventing the spread of infection  To help prevent impetigo from spreading to other body areas: Keep your fingernails short and clean. Do not scratch the blisters or sores. Cover infected areas, if necessary, to keep from scratching. Wash your hands often with soap and warm water for at least 20 seconds. To help prevent impetigo from spreading to other people: Do not share towels. Wash your clothing and bedsheets in water that is 140F (60C) or warmer. Stay home until you have used an antibiotic cream for 48 hours (2 days) or an oral antibiotic medicine for 24 hours (1 day). You  should only return to work and activities with other people if your skin shows significant improvement. You may return to contact sports after you have used antibiotic medicine for 72 hours (3 days). Contact a health care provider if: You get more blisters or sores, even with treatment. Your skin sores don't get better after 72 hours (3 days) of treatment. You have a fever. The area around your sores becomes warm, red, or tender to the touch. Get help right away if: You have dark, reddish-brown pee. You do not pee often or you pee small amounts. You have swelling in your face, hands, or feet. This information is not intended to replace advice given to you by your health care provider. Make sure you discuss any questions you have with your health care provider. Document Revised: 01/14/2023 Document Reviewed: 01/14/2023 Elsevier Patient Education  [employer].   If you have been instructed to have an in-person evaluation today at a local Urgent Care facility, please use the link below. It will take you to a list of all of our available [facility], including address, phone number and hours of operation. Please do not delay care.  [facility]  If you or a family member do not have a primary care provider, use the link below to schedule a visit and establish care. When you choose a [facility] primary care physician or advanced practice provider, you gain a long-term partner in health. Find a Primary Care Provider  Learn more about [facility]'s in-office and virtual care options:  - Get Care Now

## 2023-10-08 NOTE — Progress Notes (Signed)
 Virtual Visit Consent   Caitlyn Ramos, you are scheduled for a virtual visit with a [facility] provider today. Just as with appointments in the office, your consent must be obtained to participate. Your consent will be active for this visit and any virtual visit you may have with one of our providers in the next 365 days. If you have a MyChart account, a copy of this consent can be sent to you electronically.  As this is a virtual visit, video technology does not allow for your provider to perform a traditional examination. This may limit your provider's ability to fully assess your condition. If your provider identifies any concerns that need to be evaluated in person or the need to arrange testing (such as labs, EKG, etc.), we will make arrangements to do so. Although advances in technology are sophisticated, we cannot ensure that it will always work on either your end or our end. If the connection with a video visit is poor, the visit may have to be switched to a telephone visit. With either a video or telephone visit, we are not always able to ensure that we have a secure connection.  By engaging in this virtual visit, you consent to the provision of healthcare and authorize for your insurance to be billed (if applicable) for the services provided during this visit. Depending on your insurance coverage, you may receive a charge related to this service.  I need to obtain your verbal consent now. Are you willing to proceed with your visit today? Caitlyn Ramos has provided verbal consent on 10/08/2023 for a virtual visit (video or telephone). Delon [name], PA-C  Date: 10/08/2023 6:14 PM   Virtual Visit via Video Note   I, Delon [name], connected with  Caitlyn Ramos  ([id number], 06-30-1990) on 10/08/23 at  6:00 PM EDT by a video-enabled telemedicine application and verified that I am speaking with the correct person using two identifiers.  Location: Patient: Virtual Visit Location  Patient: Home Provider: Virtual Visit Location Provider: Home Office   I discussed the limitations of evaluation and management by telemedicine and the availability of in person appointments. The patient expressed understanding and agreed to proceed.    History of Present Illness: Caitlyn Ramos is a 33 y.o. who identifies as a female who was assigned female at birth, and is being seen today for rash.  HPI: Rash This is a new problem. The current episode started 1 to 4 weeks ago. The problem is unchanged. The affected locations include the left upper leg, left axilla and right upper leg. The rash is characterized by redness, peeling and pain (yellow crusting). She was exposed to an ill contact (has twin 32 yr old boys that had Impetigo and were treated, but now she is with same symtpoms). Pertinent negatives include no congestion, cough, facial edema, fatigue, fever or shortness of breath. Past treatments include antibiotic cream (Mupirocin). The treatment provided no relief.     Problems:  Patient Active Problem List   Diagnosis Date Noted   Polycystic ovary syndrome 12/04/2019   Obesity (BMI 35.0-39.9 without comorbidity) 07/14/2016    Allergies: No Known Allergies Medications:  Current Outpatient Medications:    cephALEXin  (KEFLEX ) 500 MG capsule, Take 1 capsule (500 mg total) by mouth 2 (two) times daily., Disp: 14 capsule, Rfl: 0   fluconazole  (DIFLUCAN ) 150 MG tablet, Take 1 tablet (150 mg total) by mouth every 3 (three) days as needed., Disp: 3 tablet, Rfl: 0  acetaminophen  (TYLENOL ) 500 MG tablet, Take 500-1,000 mg by mouth every 6 (six) hours as needed (for pain.)., Disp: , Rfl:    naproxen sodium (ALEVE) 220 MG tablet, Take 220-440 mg by mouth., Disp: , Rfl:   Observations/Objective: Patient is well-developed, well-nourished in no acute distress.  Resting comfortably at home.  Head is normocephalic, atraumatic.  No labored breathing.  Speech is clear and coherent with logical  content.  Patient is alert and oriented at baseline.  Multiple, small, annular lesions with well demarcated peeling borders noted on the upper inner thighs bilaterally and a few lesions, one pustular, noted at the top of the upper bra-line area just distal to the left axilla on the left upper flank  Assessment and Plan: 1. Impetigo (Primary) - cephALEXin  (KEFLEX ) 500 MG capsule; Take 1 capsule (500 mg total) by mouth 2 (two) times daily.  Dispense: 14 capsule; Refill: 0  2. Antibiotic-induced yeast infection - fluconazole  (DIFLUCAN ) 150 MG tablet; Take 1 tablet (150 mg total) by mouth every 3 (three) days as needed.  Dispense: 3 tablet; Refill: 0  - Impetigo exposure with lesions spreading and not resolving with topical mupirocin - Add Keflex  - Continue Mupirocin - Keep areas clean and dry - Wash once or twice daily with soap and warm water - Keep skin moisturized - Avoid picking and scratching - Diflucan  given as prophylaxis as patient tends to get vaginal yeast infections with antibiotic use - Seek in person evaluation if not improving or if worsening  Follow Up Instructions: I discussed the assessment and treatment plan with the patient. The patient was provided an opportunity to ask questions and all were answered. The patient agreed with the plan and demonstrated an understanding of the instructions.  A copy of instructions were sent to the patient via MyChart unless otherwise noted below.    The patient was advised to call back or seek an in-person evaluation if the symptoms worsen or if the condition fails to improve as anticipated.    Delon [name], PA-C

## 2023-10-12 DIAGNOSIS — F4323 Adjustment disorder with mixed anxiety and depressed mood: Secondary | ICD-10-CM | POA: Diagnosis not present

## 2023-11-02 DIAGNOSIS — F4323 Adjustment disorder with mixed anxiety and depressed mood: Secondary | ICD-10-CM | POA: Diagnosis not present

## 2023-11-09 DIAGNOSIS — F900 Attention-deficit hyperactivity disorder, predominantly inattentive type: Secondary | ICD-10-CM | POA: Diagnosis not present

## 2023-11-12 DIAGNOSIS — R030 Elevated blood-pressure reading, without diagnosis of hypertension: Secondary | ICD-10-CM | POA: Diagnosis not present

## 2023-11-12 DIAGNOSIS — S01301A Unspecified open wound of right ear, initial encounter: Secondary | ICD-10-CM | POA: Diagnosis not present

## 2023-11-15 ENCOUNTER — Encounter

## 2023-11-15 ENCOUNTER — Telehealth: Payer: Self-pay | Admitting: Family Medicine

## 2023-11-15 ENCOUNTER — Ambulatory Visit: Payer: Self-pay

## 2023-11-15 NOTE — Telephone Encounter (Unsigned)
 Copied from CRM [id number]. Topic: Clinical - Medication Question >> Nov 15, 2023  1:12 PM Tiffini S wrote: Reason for CRM: Patient was seen in urgent care for a fish hook in her arm- was on antibiotics and now have UTI- need a prescription for two tablets of fluconazole  sent to:   [id number] - [place], [place] - [address] [place] [address] Phone: [phone] Fax: [phone]  Please call the patient at [phone] to follow up on request.

## 2023-11-15 NOTE — Telephone Encounter (Signed)
 FYI Only or Action Required?: Action required by provider: medication refill request.  Patient was last seen in primary care on 10/08/2023 by Vivienne Delon HERO, PA-C.  Called Nurse Triage reporting Vaginal Discharge.  Symptoms began yesterday.  Interventions attempted: Nothing.  Symptoms are: gradually worsening.  Triage Disposition: Home Care  Patient/caregiver understands and will follow disposition?: No, wishes to speak with PCP  Pt currently on atbx requesting diflucan  2 days.

## 2023-11-15 NOTE — Telephone Encounter (Signed)
 This message was routed to wrong office, message sent to phone team about this.

## 2023-11-15 NOTE — Telephone Encounter (Signed)
 Reason for Disposition  Symptoms of a vaginal yeast infection (i.e., white, thick, cottage-cheese-like, itchy, not bad smelling discharge)  Answer Assessment - Initial Assessment Questions Antibiotic recently for hook in ear. First dose on Friday night. Started noticing last night. States she is prone to them when she gets antibiotics.    1. DISCHARGE: Describe the discharge. (e.g., white, yellow, green, gray, foamy, cottage cheese-like)     White discharge,  2. ODOR: Is there a bad odor?      3. ONSET: When did the discharge begin?     Noticed last night  4. RASH: Is there a rash in the genital area? If Yes, ask: Describe it. (e.g., redness, blisters, sores, bumps)     redness 5. ABDOMEN PAIN: Are you having any abdomen pain? If Yes, ask: What does it feel like?  (e.g., crampy, dull, intermittent, constant)      no 6. ABDOMEN PAIN SEVERITY: If present, ask: How bad is it? (e.g., Scale 1-10; mild, moderate, or severe)     no 7. CAUSE: What do you think is causing the discharge? Have you had the same problem before? What happened then?    Currently on antibiotics 8. OTHER SYMPTOMS: Do you have any other symptoms? (e.g., fever, itching, urination pain, vaginal bleeding, vaginal foreign body)     Itchy why discharge  Protocols used: Vaginal Discharge-A-AH

## 2023-11-16 ENCOUNTER — Ambulatory Visit: Admitting: Family Medicine

## 2023-11-16 ENCOUNTER — Encounter: Payer: Self-pay | Admitting: Family Medicine

## 2023-11-16 ENCOUNTER — Telehealth: Payer: Self-pay

## 2023-11-16 VITALS — BP 119/83 | HR 73 | Ht 67.0 in | Wt 262.8 lb

## 2023-11-16 DIAGNOSIS — E559 Vitamin D deficiency, unspecified: Secondary | ICD-10-CM | POA: Diagnosis not present

## 2023-11-16 DIAGNOSIS — R7989 Other specified abnormal findings of blood chemistry: Secondary | ICD-10-CM

## 2023-11-16 DIAGNOSIS — E0590 Thyrotoxicosis, unspecified without thyrotoxic crisis or storm: Secondary | ICD-10-CM | POA: Diagnosis not present

## 2023-11-16 DIAGNOSIS — B379 Candidiasis, unspecified: Secondary | ICD-10-CM

## 2023-11-16 DIAGNOSIS — T3695XA Adverse effect of unspecified systemic antibiotic, initial encounter: Secondary | ICD-10-CM

## 2023-11-16 DIAGNOSIS — R41840 Attention and concentration deficit: Secondary | ICD-10-CM

## 2023-11-16 DIAGNOSIS — E669 Obesity, unspecified: Secondary | ICD-10-CM

## 2023-11-16 DIAGNOSIS — B3731 Acute candidiasis of vulva and vagina: Secondary | ICD-10-CM

## 2023-11-16 NOTE — Assessment & Plan Note (Signed)
 Symptoms of vaginal yeast infection after a course of antibiotics (cephalexin , levofloxacin). - Will prescribe Diflucan  pending review of liver function tests from today's labs.

## 2023-11-16 NOTE — Telephone Encounter (Signed)
 Copied from CRM #[id number]. Topic: Referral - Request for Referral >> Nov 16, 2023  8:09 AM Kendralyn S wrote: Did the patient discuss referral with their provider in the last year? No (If No - schedule appointment) (If Yes - send message)  Appointment offered? Yes  Type of order/referral and detailed reason for visit: endocrinologist  Preference of office, provider, location: [facility], dr connell lawrence  If referral order, have you been seen by this specialty before? Yes (If Yes, this issue or another issue? When? Where?  Can we respond through MyChart? Yes

## 2023-11-16 NOTE — Assessment & Plan Note (Signed)
 Recently diagnosed and trialed on Vyvanse and Concerta. Has self-discontinued Concerta due to concern it is exacerbating hyperthyroid symptoms. Will defer management to prescribing provider, Dr. Bruno Proper, PA at [facility].

## 2023-11-16 NOTE — Progress Notes (Signed)
 Acute Office Visit  Subjective:     Patient ID: Caitlyn Ramos, female    DOB: 22-Nov-1990, 33 y.o.   MRN: [id number]  Chief Complaint  Patient presents with   Thyroid  Problem    HPI Patient is in today for   Subjective - Presents for evaluation of recurrent hyperthyroidism symptoms. History of hyperthyroidism post-partum in 2020, treated with methimazole  for 6 months with resolution. Thyroid  function was normal in January of this year. Started Vyvanse for ADHD two months ago, which was stopped due to heart palpitations and chest pain. Switched to low-dose Concerta (18 mg) one week ago. Recent blood work from [facility] (last week, non-fasting) showed very low TSH and elevated liver enzymes. Reports feeling crappy. Symptoms include worsened tremors, chest pain, headaches, and significant bilateral knee pain. Has stopped taking Concerta due to symptoms. - Reports recent weight gain of 30 lbs this year despite diet and exercise, after two previous 100 lb weight losses. - Experienced a fish hook injury to the ear on Friday and was prescribed antibiotics at [facility] urgent care. - Reports vaginal yeast infection symptoms secondary to antibiotic use and requests treatment.  Medications Currently taking cephalexin  and levofloxacin for a recent skin injury. Recently stopped Concerta 18 mg. Previously on Vyvanse, stopped due to side effects. Past use of methimazole  in 2020 for hyperthyroidism. Refused metformin for PCOS in the past due to GI side effects.  PMH, PSH, FH, Social Hx PMHx: Hyperthyroidism (2020, in remission), ADHD (diagnosed 2 months ago), PCOS, history of significant weight fluctuation. No history of alcohol use. PSH: Fish hook removal from ear last week. FH: Not discussed. Social Hx: Does CrossFit daily, has competed in psychologist, educational. Has five-year-old twins.  ROS Constitutional: Reports feeling crappy. Denies fever. Cardiovascular: Positive for chest pain and  palpitations. Resting heart rate reported to have increased from ~56 to ~100 bpm per Apple Watch. Neurological: Positive for worsened tremors and headaches. Musculoskeletal: Positive for severe bilateral knee pain. Psychiatric: Reports feeling wired.   ROS      Objective:    BP 119/83   Pulse 73   Ht 5' 7 (1.702 m)   Wt 262 lb 12.8 oz (119.2 kg)   LMP 10/29/2023   SpO2 100%   BMI 41.16 kg/m    Physical Exam General: Appears well. HEENT: No proptosis or lid lag observed. Oropharynx is clear. Neck: No thyromegaly or tenderness on palpation. CV: Regular rate and rhythm, no murmurs, rubs, or gallops. PULM: Lungs clear to auscultation bilaterally.     Assessment & Plan:   Hyperthyroidism Assessment & Plan: History of post-partum hyperthyroidism in 2020, which resolved with methimazole . Now presenting with symptoms of tremulousness, jitteriness, palpitations, and chest pain. Recent outside labs from 11/12/2023 show suppressed TSH and elevated LFTs. Exam is notable for fine hand tremors. EKG was performed in office. - Repeat thyroid  function tests, comprehensive metabolic panel. - Refer to Endocrinology at [facility] per request. - EKG performed in office to evaluate palpitations and chest pain.  Orders: -     TSH+T3+ThyAbs+TPO Ab+TRAb+T... -     Comprehensive metabolic panel with GFR -     Iron, TIBC and Ferritin Panel -     CBC with Differential/Platelet -     VITAMIN D  25 Hydroxy (Vit-D Deficiency, Fractures) -     EKG 12-Lead -     Ambulatory referral to Endocrinology  Vitamin D  deficiency -     VITAMIN D  25 Hydroxy (Vit-D Deficiency, Fractures)  Vaginal yeast infection  Assessment & Plan: Symptoms of vaginal yeast infection after a course of antibiotics (cephalexin , levofloxacin). - Will prescribe Diflucan  pending review of liver function tests from today's labs.   Attention deficit Assessment & Plan: Recently diagnosed and trialed on Vyvanse and Concerta. Has  self-discontinued Concerta due to concern it is exacerbating hyperthyroid symptoms. Will defer management to prescribing provider, Dr. Bruno Proper, PA at [facility].   Obesity (BMI 35.0-39.9 without comorbidity) -     Comprehensive metabolic panel with GFR  Antibiotic-induced yeast infection -     Fluconazole ; Take 1 tablet PO.  If symptoms persist may take second tablet 72 hours later.  Dispense: 2 tablet; Refill: 0  Other orders -     Iron, TIBC and Ferritin Panel -     CBC with Differential/Platelet -     Comprehensive metabolic panel with GFR -     VITAMIN D  25 Hydroxy (Vit-D Deficiency, Fractures)     Return for needs f/u w/ kara in next 3 months.  Toribio MARLA Slain, MD

## 2023-11-16 NOTE — Telephone Encounter (Signed)
 Pt is coming in at 10:30 today 11/16/23 to discuss.

## 2023-11-16 NOTE — Patient Instructions (Signed)
 It was nice to see you today,  We addressed the following topics today:  - I have ordered new blood work for you, which we can draw here today. This will include a check of your thyroid  and liver function. - We will perform an EKG today in the office to check your heart. - I will send a referral to the endocrinologist at [facility] that you requested. - I will review your liver function tests once they result. If they are acceptable, I will call in a prescription for Diflucan  for the yeast infection. Please wait to hear from us . - You can upload the images of your [facility] lab results to your MyChart account for our records.  Have a great day,  Rolan Slain, MD

## 2023-11-16 NOTE — Assessment & Plan Note (Signed)
 History of post-partum hyperthyroidism in 2020, which resolved with methimazole . Now presenting with symptoms of tremulousness, jitteriness, palpitations, and chest pain. Recent outside labs from 11/12/2023 show suppressed TSH and elevated LFTs. Exam is notable for fine hand tremors. EKG was performed in office. - Repeat thyroid  function tests, comprehensive metabolic panel. - Refer to Endocrinology at [facility] per request. - EKG performed in office to evaluate palpitations and chest pain.

## 2023-11-17 ENCOUNTER — Encounter: Payer: Self-pay | Admitting: Family Medicine

## 2023-11-17 NOTE — Telephone Encounter (Signed)
 Spoke to pt let her know that the Thyroid  is still pending. She asked about the referral and I advised that provider maybe waiting on labs to come back.

## 2023-11-21 MED ORDER — FLUCONAZOLE 150 MG PO TABS: ORAL_TABLET | ORAL | 0 refills | Status: AC

## 2023-11-22 ENCOUNTER — Telehealth: Payer: Self-pay

## 2023-11-22 DIAGNOSIS — F4323 Adjustment disorder with mixed anxiety and depressed mood: Secondary | ICD-10-CM | POA: Diagnosis not present

## 2023-11-22 NOTE — Telephone Encounter (Signed)
 Patient called about her TSH +T3+ThyAbs...  and other thyroid  panel. I called [facility] the tech stated that these test had to go to [place]  to be resulted patient wants these lab to be redrawn separate

## 2023-11-23 NOTE — Telephone Encounter (Signed)
 She wants which labs separate?  If we order the labs again, they're still going to send them off to the same place.  Does she want tsh separate from the antibody labs?

## 2023-11-24 ENCOUNTER — Ambulatory Visit: Payer: Self-pay | Admitting: Family Medicine

## 2023-11-24 ENCOUNTER — Telehealth: Payer: Self-pay

## 2023-11-24 ENCOUNTER — Other Ambulatory Visit: Payer: Self-pay

## 2023-11-24 DIAGNOSIS — E0590 Thyrotoxicosis, unspecified without thyrotoxic crisis or storm: Secondary | ICD-10-CM

## 2023-11-24 NOTE — Telephone Encounter (Signed)
 Referral to [facility] sent it now

## 2023-11-24 NOTE — Telephone Encounter (Signed)
 Copied from CRM [id number]. Topic: Referral - Question >> Nov 24, 2023 12:48 PM Caitlyn Ramos wrote: Patient called in upset due to the PCP sending the current Endocrinology referral [phone]  to the incorrect provider... Patient stated she told PCP:  [name] whom to send it to.   Preference of office, provider, location: [facility]  [address] [place], [place] [address] Office: [phone]  Fax: [phone]   Endocrinologist : Connell CHARLENA Lawrence, MD, MPH.[email or web address]  **Along with referral please send over the following via Fax:  most current labs/past history hyperthyroidism -(dating back to 2020 with Trixie File, MD)

## 2023-11-25 ENCOUNTER — Other Ambulatory Visit: Payer: Self-pay | Admitting: Family Medicine

## 2023-11-25 DIAGNOSIS — R030 Elevated blood-pressure reading, without diagnosis of hypertension: Secondary | ICD-10-CM | POA: Diagnosis not present

## 2023-11-25 DIAGNOSIS — J020 Streptococcal pharyngitis: Secondary | ICD-10-CM | POA: Diagnosis not present

## 2023-11-25 DIAGNOSIS — E0500 Thyrotoxicosis with diffuse goiter without thyrotoxic crisis or storm: Secondary | ICD-10-CM | POA: Diagnosis not present

## 2023-11-25 MED ORDER — METHIMAZOLE 10 MG PO TABS: 10.0000 mg | ORAL_TABLET | Freq: Three times a day (TID) | ORAL | 0 refills | Status: DC

## 2023-11-25 NOTE — Addendum Note (Signed)
 Addended by: [name] on: 11/25/2023 08:36 AM   Modules accepted: Orders

## 2023-11-30 LAB — COMPREHENSIVE METABOLIC PANEL WITH GFR
ALT: 78 IU/L — ABNORMAL HIGH (ref 0–32)
AST: 43 IU/L — ABNORMAL HIGH (ref 0–40)
Albumin: 4.1 g/dL (ref 3.9–4.9)
Alkaline Phosphatase: 87 IU/L (ref 41–116)
BUN/Creatinine Ratio: 17 (ref 9–23)
BUN: 12 mg/dL (ref 6–20)
Bilirubin Total: 0.4 mg/dL (ref 0.0–1.2)
CO2: 24 mmol/L (ref 20–29)
Calcium: 9.4 mg/dL (ref 8.7–10.2)
Chloride: 103 mmol/L (ref 96–106)
Creatinine, Ser: 0.72 mg/dL (ref 0.57–1.00)
Globulin, Total: 2.5 g/dL (ref 1.5–4.5)
Glucose: 91 mg/dL (ref 70–99)
Potassium: 4.2 mmol/L (ref 3.5–5.2)
Sodium: 139 mmol/L (ref 134–144)
Total Protein: 6.6 g/dL (ref 6.0–8.5)
eGFR: 114 mL/min/1.73 (ref >59)

## 2023-11-30 LAB — CBC WITH DIFFERENTIAL/PLATELET
Basophils Absolute: 0 x10E3/uL (ref 0.0–0.2)
Basos: 1 %
EOS (ABSOLUTE): 0.1 x10E3/uL (ref 0.0–0.4)
Eos: 2 %
Hematocrit: 38.8 % (ref 34.0–46.6)
Hemoglobin: 13 g/dL (ref 11.1–15.9)
Immature Grans (Abs): 0 x10E3/uL (ref 0.0–0.1)
Immature Granulocytes: 0 %
Lymphocytes Absolute: 1.3 x10E3/uL (ref 0.7–3.1)
Lymphs: 30 %
MCH: 31.1 pg (ref 26.6–33.0)
MCHC: 33.5 g/dL (ref 31.5–35.7)
MCV: 93 fL (ref 79–97)
Monocytes Absolute: 0.3 x10E3/uL (ref 0.1–0.9)
Monocytes: 7 %
Neutrophils Absolute: 2.7 x10E3/uL (ref 1.4–7.0)
Neutrophils: 60 %
Platelets: 313 x10E3/uL (ref 150–450)
RBC: 4.18 x10E6/uL (ref 3.77–5.28)
RDW: 11.6 % — ABNORMAL LOW (ref 11.7–15.4)
WBC: 4.4 x10E3/uL (ref 3.4–10.8)

## 2023-11-30 LAB — TSH+T3+THYABS+TPO AB+TRAB+T...
Anti-Thyroglobulin Antibodies: <1 [IU]/mL
Anti-Thyroid Peroxidase Ab: 159 [IU]/mL — ABNORMAL HIGH
Free T-3: 5.7 pg/mL — ABNORMAL HIGH
Free T4 by Dialysis: 3.2 ng/dL
Reverse T3,  LCMS Endo Sci: 32.2 ng/dL — ABNORMAL HIGH
TSH Receptor Antibody (TBII): <0.6 U/L
TSH: <0.01 uU/mL — ABNORMAL LOW
Triiodothyronine (T-3), Serum: 190 ng/dL — ABNORMAL HIGH

## 2023-11-30 LAB — IRON,TIBC AND FERRITIN PANEL
Ferritin: 113 ng/mL (ref 15–150)
Iron Saturation: 32 % (ref 15–55)
Iron: 93 ug/dL (ref 27–159)
Total Iron Binding Capacity: 291 ug/dL (ref 250–450)
UIBC: 198 ug/dL (ref 131–425)

## 2023-11-30 LAB — VITAMIN D 25 HYDROXY (VIT D DEFICIENCY, FRACTURES): Vit D, 25-Hydroxy: 41.1 ng/mL (ref 30.0–100.0)

## 2023-12-01 NOTE — Addendum Note (Signed)
 Addended by: ZIMMERMAN RUMPLE, Freddi Schrager D on: 12/01/2023 01:24 PM   Modules accepted: Orders

## 2023-12-09 DIAGNOSIS — F4323 Adjustment disorder with mixed anxiety and depressed mood: Secondary | ICD-10-CM | POA: Diagnosis not present

## 2023-12-13 DIAGNOSIS — H0014 Chalazion left upper eyelid: Secondary | ICD-10-CM | POA: Diagnosis not present

## 2023-12-27 ENCOUNTER — Other Ambulatory Visit: Payer: Self-pay | Admitting: Family Medicine

## 2023-12-27 MED ORDER — METHIMAZOLE 10 MG PO TABS: 10.0000 mg | ORAL_TABLET | Freq: Three times a day (TID) | ORAL | 0 refills | Status: AC

## 2023-12-28 ENCOUNTER — Other Ambulatory Visit

## 2023-12-29 ENCOUNTER — Other Ambulatory Visit: Payer: Self-pay

## 2023-12-29 DIAGNOSIS — E0590 Thyrotoxicosis, unspecified without thyrotoxic crisis or storm: Secondary | ICD-10-CM

## 2023-12-29 DIAGNOSIS — E66813 Obesity, class 3: Secondary | ICD-10-CM | POA: Diagnosis not present

## 2023-12-29 DIAGNOSIS — Z1331 Encounter for screening for depression: Secondary | ICD-10-CM | POA: Diagnosis not present

## 2023-12-29 DIAGNOSIS — E282 Polycystic ovarian syndrome: Secondary | ICD-10-CM | POA: Diagnosis not present

## 2024-01-10 ENCOUNTER — Other Ambulatory Visit

## 2024-01-11 ENCOUNTER — Other Ambulatory Visit

## 2024-01-11 DIAGNOSIS — F4323 Adjustment disorder with mixed anxiety and depressed mood: Secondary | ICD-10-CM | POA: Diagnosis not present

## 2024-02-15 ENCOUNTER — Telehealth: Payer: Self-pay

## 2024-02-15 NOTE — Telephone Encounter (Signed)
 Called patient LVM to contact the office if pt calls please advise about the Flu vaccine

## 2024-02-16 ENCOUNTER — Encounter

## 2024-03-01 ENCOUNTER — Telehealth: Payer: Self-pay

## 2024-03-01 NOTE — Telephone Encounter (Signed)
 Error
